# Patient Record
Sex: Female | Born: 1950 | Race: White | Hispanic: No | Marital: Single | Smoking: Former smoker
Health system: Southern US, Community
[De-identification: ages and names within clinical notes are randomized; demographics above are authoritative.]

## PROBLEM LIST (undated history)

## (undated) DIAGNOSIS — R569 Unspecified convulsions: Secondary | ICD-10-CM

## (undated) DIAGNOSIS — K219 Gastro-esophageal reflux disease without esophagitis: Secondary | ICD-10-CM

## (undated) DIAGNOSIS — M549 Dorsalgia, unspecified: Secondary | ICD-10-CM

## (undated) DIAGNOSIS — J449 Chronic obstructive pulmonary disease, unspecified: Secondary | ICD-10-CM

## (undated) DIAGNOSIS — I2699 Other pulmonary embolism without acute cor pulmonale: Secondary | ICD-10-CM

## (undated) HISTORY — DX: Dorsalgia, unspecified: M54.9

## (undated) HISTORY — PX: TUBAL LIGATION: SHX77

## (undated) HISTORY — DX: Gastro-esophageal reflux disease without esophagitis: K21.9

## (undated) HISTORY — PX: TONSILLECTOMY: SUR1361

---

## 2000-11-18 ENCOUNTER — Other Ambulatory Visit (HOSPITAL_COMMUNITY): Payer: Self-pay | Admitting: Neurology

## 2003-04-25 ENCOUNTER — Emergency Department (HOSPITAL_COMMUNITY): Payer: Self-pay

## 2012-09-17 ENCOUNTER — Other Ambulatory Visit (INDEPENDENT_AMBULATORY_CARE_PROVIDER_SITE_OTHER): Payer: Self-pay | Admitting: Physician Assistant

## 2016-12-11 NOTE — Care Management Notes (Signed)
MARS INTAKE    Referring Provider and Contact Number:  Dr. Nadene Rubins  Transfer Source and Contact Number:   [phone]  Transfer Emergent:  No    Date Admitted:  12/11/16 @ 12:20 pm  Diagnosis:  Altered Mental Status  PMH:      Dialysis:   no    Cancer:  no     Isolation:  no     Is patient established with family practice/attending?    Reason for transfer:  Needs higher level of care    Patient story/clinical presentation:  Presented to [facility] ER with c/o confusion.  Patient does not remember anything since Friday, December 07, 2016 although she went to class reunion and family reunion on Friday & Saturday.  Was found lying in bed confused by neighbor on _0 %   O2:     RA       Per MD labs WNL unless noted below.    Labs:  WBC (3.6-11) 11.8   HGB (13.1-17.3)    Hct (39.8-50.2) 50.6   PLT (140-400)    Na (135-145)    K+ (3.5-5.1)    CL (96-111)    CO2 (23-35)    BUN (8-26)    Cr (0.62-1.27)    Glucose (60-105) 132   Ca (8.5-10.4)    Mag (1.6-2.5)    Phos (2.4-4.7)    BNP (<100)    D-Dimer (<233)    AST (8-41)    ALT (<55)    Alk Phos (<150)    Amylase (25-125)    Lipase (10-80)    T. Bili (0.3-1.3)    PT (8.7-13.2)    PTT (25.1-36.5)    INR (0.8-1.2)     Trip-1 (<0.03) 0.34   CK    CPK    CK-MB    ABG ph    ABG PCO2    ABG PO2    ABG HCO3    Base def/exc    LP Glucose    LP Neutrophil    LP Protein    LP WBC    LP Other  Radiology (please place images on image grid):  Images requested  EKG:  IV Access:  PIV  Medications, IVF, Drips:  None    Oxygen/Bipap/Vent settings:    TV:        Peep:        FiO2:        Rate:            Pt class and accommodation: FLOOR  Service and accepting MD: MHS7/DR. [facility]    *Send Text Page to accepting service MD. *

## 2016-12-12 ENCOUNTER — Encounter (HOSPITAL_COMMUNITY): Payer: Self-pay | Admitting: Student in an Organized Health Care Education/Training Program

## 2016-12-12 ENCOUNTER — Ambulatory Visit (HOSPITAL_BASED_OUTPATIENT_CLINIC_OR_DEPARTMENT_OTHER): Payer: Medicare Other

## 2016-12-12 ENCOUNTER — Observation Stay (HOSPITAL_BASED_OUTPATIENT_CLINIC_OR_DEPARTMENT_OTHER): Payer: Medicare Other | Admitting: Internal Medicine

## 2016-12-12 ENCOUNTER — Observation Stay
Admission: AD | Admit: 2016-12-12 | Discharge: 2016-12-13 | Disposition: A | Discharge status: Home / Self Care | Payer: Medicare Other | Source: Other Acute Inpatient Hospital | Attending: Internal Medicine | Admitting: Internal Medicine

## 2016-12-12 DIAGNOSIS — M545 Low back pain: Secondary | ICD-10-CM

## 2016-12-12 DIAGNOSIS — R4182 Altered mental status, unspecified: Secondary | ICD-10-CM

## 2016-12-12 DIAGNOSIS — T405X5A Adverse effect of cocaine, initial encounter: Secondary | ICD-10-CM

## 2016-12-12 DIAGNOSIS — R001 Bradycardia, unspecified: Secondary | ICD-10-CM

## 2016-12-12 DIAGNOSIS — J849 Interstitial pulmonary disease, unspecified: Secondary | ICD-10-CM

## 2016-12-12 DIAGNOSIS — T404X5A Adverse effect of other synthetic narcotics, initial encounter: Secondary | ICD-10-CM | POA: Insufficient documentation

## 2016-12-12 DIAGNOSIS — G9340 Encephalopathy, unspecified: Secondary | ICD-10-CM | POA: Diagnosis not present

## 2016-12-12 DIAGNOSIS — G92 Toxic encephalopathy: Secondary | ICD-10-CM

## 2016-12-12 DIAGNOSIS — F17210 Nicotine dependence, cigarettes, uncomplicated: Secondary | ICD-10-CM | POA: Insufficient documentation

## 2016-12-12 DIAGNOSIS — F19929 Other psychoactive substance use, unspecified with intoxication, unspecified: Secondary | ICD-10-CM

## 2016-12-12 DIAGNOSIS — J45909 Unspecified asthma, uncomplicated: Secondary | ICD-10-CM | POA: Insufficient documentation

## 2016-12-12 LAB — URINALYSIS, MICROSCOPIC
RBCS: 0 /HPF (ref <6.0)
WBCS: 0 /HPF (ref <11.0)

## 2016-12-12 LAB — TROPONIN-I
TROPONIN I: 160 ng/L — ABNORMAL HIGH (ref 0–30)
TROPONIN I: 120 ng/L — ABNORMAL HIGH (ref 0–30)
TROPONIN I: 99 ng/L — ABNORMAL HIGH (ref 0–30)
TROPONIN I: 128 ng/L — ABNORMAL HIGH (ref 0–30)

## 2016-12-12 LAB — ECG 12-LEAD
Atrial Rate: 52 {beats}/min
Calculated P Axis: 59 degrees
Calculated R Axis: 53 degrees
Calculated T Axis: 132 degrees
PR Interval: 158 ms
QRS Duration: 72 ms
QT Interval: 466 ms
QTC Calculation: 433 ms
Ventricular rate: 52 {beats}/min

## 2016-12-12 LAB — CBC WITH DIFF
BASOPHIL #: 0.06 x10ˆ3/uL (ref 0.00–0.20)
BASOPHIL %: 1 %
EOSINOPHIL #: 0.09 x10ˆ3/uL (ref 0.00–0.50)
EOSINOPHIL %: 1 %
HCT: 47 % — ABNORMAL HIGH (ref 33.5–45.2)
HGB: 16.1 g/dL — ABNORMAL HIGH (ref 11.2–15.2)
LYMPHOCYTE #: 1.61 x10ˆ3/uL (ref 1.00–4.80)
LYMPHOCYTE %: 19 %
MCH: 31.7 pg (ref 27.4–33.0)
MCHC: 34.3 g/dL (ref 32.5–35.8)
MCV: 92.5 fL (ref 78.0–100.0)
MONOCYTE #: 0.61 x10ˆ3/uL (ref 0.30–1.00)
MONOCYTE %: 7 %
MPV: 8.2 fL (ref 7.5–11.5)
NEUTROPHIL #: 6.15 x10ˆ3/uL (ref 1.50–7.70)
NEUTROPHIL %: 72 %
PLATELETS: 191 x10ˆ3/uL (ref 140–450)
RBC: 5.08 x10ˆ6/uL — ABNORMAL HIGH (ref 3.63–4.92)
RDW: 13 % (ref 12.0–15.0)
WBC: 8.5 10*3/uL (ref 3.5–11.0)
WBC: 8.5 x10?3/uL (ref 3.5–11.0)

## 2016-12-12 LAB — BASIC METABOLIC PANEL
ANION GAP: 8 mmol/L (ref 4–13)
BUN/CREA RATIO: 30 — ABNORMAL HIGH (ref 6–22)
BUN: 21 mg/dL (ref 8–25)
CALCIUM: 9.1 mg/dL (ref 8.5–10.2)
CHLORIDE: 108 mmol/L (ref 96–111)
CO2 TOTAL: 25 mmol/L (ref 22–32)
CREATININE: 0.7 mg/dL (ref 0.49–1.10)
ESTIMATED GFR: >59 mL/min/1.73mˆ2 (ref >59)
GLUCOSE: 103 mg/dL (ref 65–139)
POTASSIUM: 3.8 mmol/L (ref 3.5–5.1)
SODIUM: 141 mmol/L (ref 136–145)

## 2016-12-12 LAB — DRUG SCREEN, HIGH OPIATE CUTOFF, NO CONFIRMATION, URINE
AMPHETAMINES URINE: NEGATIVE
BARBITURATES URINE: NEGATIVE
BENZODIAZEPINES URINE: NEGATIVE
BUPRENORPHINE URINE: POSITIVE — AB
CANNABINOIDS URINE: NEGATIVE
COCAINE METABOLITES URINE: POSITIVE — AB
CREATININE RANDOM URINE: 156 mg/dL
ECSTASY/MDMA URINE: NEGATIVE
METHADONE URINE: NEGATIVE
OPIATES URINE (HIGH CUTOFF): NEGATIVE
OXYCODONE URINE: NEGATIVE

## 2016-12-12 LAB — ETHANOL, SERUM: ETHANOL: NOT DETECTED

## 2016-12-12 LAB — URINALYSIS, MACROSCOPIC
BILIRUBIN: NEGATIVE mg/dL
COLOR: NORMAL
GLUCOSE: NEGATIVE mg/dL
KETONES: NEGATIVE mg/dL
LEUKOCYTES: NEGATIVE WBCs/uL
NITRITE: NEGATIVE
PH: 6 (ref 5.0–8.0)
PROTEIN: NEGATIVE mg/dL
SPECIFIC GRAVITY: 1.025 (ref 1.005–1.030)
UROBILINOGEN: 4 mg/dL — AB

## 2016-12-12 LAB — ETHANOL, SERUM/PLASMA: ETHANOL: <10 mg/dL (ref <10)

## 2016-12-12 LAB — PHOSPHORUS: PHOSPHORUS: 3.9 mg/dL (ref 2.3–4.0)

## 2016-12-12 LAB — MAGNESIUM: MAGNESIUM: 2.3 mg/dL (ref 1.6–2.5)

## 2016-12-12 LAB — THYROID STIMULATING HORMONE WITH FREE T4 REFLEX: TSH: 1.114 u[IU]/mL (ref 0.350–5.000)

## 2016-12-12 MED ORDER — SODIUM CHLORIDE 0.9 % (FLUSH) INJECTION SYRINGE
2.0000 mL | INJECTION | INTRAMUSCULAR | Status: DC | PRN
Start: 2016-12-12 — End: 2016-12-13

## 2016-12-12 MED ORDER — ENOXAPARIN 40 MG/0.4 ML SUBCUTANEOUS SYRINGE
40.0000 mg | INJECTION | Freq: Every day | SUBCUTANEOUS | Status: DC
Start: 2016-12-12 — End: 2016-12-13
  Administered 2016-12-12 – 2016-12-13 (×2): 40 mg via SUBCUTANEOUS
  Filled 2016-12-12 (×2): qty 0.4

## 2016-12-12 MED ORDER — SODIUM CHLORIDE 0.9 % (FLUSH) INJECTION SYRINGE
2.0000 mL | INJECTION | Freq: Three times a day (TID) | INTRAMUSCULAR | Status: DC
Start: 2016-12-12 — End: 2016-12-13
  Administered 2016-12-12 – 2016-12-13 (×4): 2 mL

## 2016-12-12 MED ADMIN — sodium chloride 0.9 % (flush) injection syringe: @ 14:00:00

## 2016-12-12 NOTE — H&P (Signed)
[facility]  General Medicine  Admission H&P    Date of Service:  12/12/2016  Judy, Ball, 66 y.o. female  Date of Admission:  12/12/2016  Date of Birth:  Sep 20, 1950  PCP: No Pcp    LAY CAREGIVER   Appointed Lay Caregiver?: I Decline     Information Obtained from: patient and daughter  Chief Complaint:  Acute encephalopathy    HPI: Judy Ball is a 66 y.o., White female who presents with a four day history of altered mental status and impaired memory. The patient last recollects events from 8/3, and since then has no memory of any events. Per patient's family, she was seen at a family reunion on Saturday, and was seen telling relatives that she was to attend a class reunion. The patient was afterwards then seen on Monday by a nephew who lives next door and went to check in on the patient, noticing that she seemed to be confused, unable to get out of bed, and complaining of a sore back. The nephew contacted the patient's daughter, who arrived on Tuesday and took the patient to the [facility] ER. Initial workup there was positive for a UDS with cocaine, and CT head was negative for an acute process. On examination at the ER, tongue maceration was noted, as well as bruising over the elbows and tenderness over the coccyx. She was then transferred to [facility] for further workup for her acute encephalopathy.    She denies any knowledge of LOC, falls, seizures, head trauma, dizziness, bowel or bladder complaints, fever, cough, sore throat, vision changes, chest pain, or focal neurological deficits. She lives alone at home, and has no difficulty managing her DALYs    Her past medical history is unremarkable.    PAST MEDICAL:    No past medical history on file.  Past Medical History was reviewed and is negative   Past Surgical History was reviewed and is negative         Medications Prior to Admission     None        No Known Allergies    Family History  No significant family history    Social History  Social History      Social History   . Marital status: Divorced     Spouse name: N/A   . Number of children: N/A   . Years of education: N/A     Occupational History   . Not on file.     Social History Main Topics   . Smoking status: Current Every Day Smoker     Packs/day: 1.00     Years: 40.00   . Smokeless tobacco: Not on file   . Alcohol use Not on file   . Drug use: Not on file   . Sexual activity: Not on file     Other Topics Concern   . Not on file     Social History Narrative   . No narrative on file     Review of Systems:  General: Negative for fevers, chills or weight loss   Eyes:  Negative for change in vision   HENT: Negative for hearing loss, nasal congestion, epistaxis, sore mouth,  sore throat   Cardiovascular: Negative for chest pain, palpitations, lower extremity edema   Respiratory: Negative for cough, dyspnea, wheezing   GI: Negative for nausea, vomiting, abdominal pain, diarrhea, constipation, melena,  hematochezia, dysphagia, odynophagia   GU: Negative for hematuria, dysuria   Musculoskeletal: Negative for myalgias, arthralgias  Skin: Negative for rashes, lesions   Neurological: Negative for lightheadedness, headaches, dizziness, paresthesias, weakness   Hematological: Negative for easy bruising or bleeding   Psych:  Negative for depression, anxiety       Examination:  Temperature: 36.6 C (97.9 F) Heart Rate: 59 BP (Non-Invasive): 133/72   Respiratory Rate: 18 SpO2-1: 95 % Pain Score (Numeric, Faces): 0     General: In no acute distress, well developed, well nourished  HEENT: Mucous membranes moist  Eyes: Conjunctiva non-icteric, no conjunctival pallor  Neck: Soft, supple  CV: Regular rate and rhythm, with no murmurs  Pulm: Clear to auscultation bilaterally, with no wheezing  Abd: Soft, nontender, nondistended, with normal bowel sounds  Extrem: No cyanosis or edema  Skin: Warm and Dry  Neuro: AAO x 3, with no focal deficits   - Strength 5/5 BUE and BLE   - Sensation intact to light touch and symmetric throughout     Labs:    Lab Results Today:  No results found for any visits on 12/12/16 (from the past 24 hour(s)).    DNR Status:  Full Code    Assessment/Plan:   Active Hospital Problems    Diagnosis   . Primary Problem: Acute encephalopathy   . Substance intoxication (CMS HCC)     Acute encephalopathy  - Positive UDS for cocaine raises the possibility of substance intoxication as the reason for her episode of memory loss and encephalopathy  - Due to oral exam findings concerning for injury to the tongue, there is an underlying possibility of seizure as the antecedent cause. EEG ordered to rule out recent seizure  - ECG, troponins sent due to concern for possible arrythmia/cardiogenic syncope  - BMP, TSH sent for a possible metabolic cause for her acute encephalopathy  - UA, CBC, to rule out infectious cause of her symptoms  - Repeating UDS, serum ethanol     DVT/PE Prophylaxis: Enoxaparin    Armaghan-e-Rehman Mansoor  PGY-1 Internal Medicine  [phone]      I saw and examined the patient.  I reviewed the resident's note.  I agree with the findings and plan of care as documented in the resident's note.  Any exceptions/additions are edited/noted.    Lisbeth PlyMatthew Ewen Varnell, MD

## 2016-12-12 NOTE — Progress Notes (Signed)
INTERNAL MEDICINE  Progress Note     Name: Judy Ball,Judy Ball, 66 y.o. female Date of Service: 12/12/2016   Date of Birth: 08/19/1950 Date of Admission: 12/12/2016   PCP:No Pcp LOS: 1   BJY:[id number]   Attending: Lisbeth PlyBrunner, Cinque Begley, MD  Chief Complaint: had no chief complaint listed for this encounter.   Code Status: Full Code Admitted for: Acute encephalopathy     SUBJECTIVE:    Yesterday Ms. Judy Ball . Overnight she presented with altered mental status and recent memory loss for last 4-5 days. She is alert/oriented/ awake x 3. She answers questions appropriately. C/o pain at lower back over coccyx. She denies chest pain, headache, abdominal pain, dysuria, frequency, altered bowel movement. No significant acute event overnight after her admission.      OBJECTIVE:    EXAMINATION:  Temperature: 36.8 C (98.2 F) BP (Non-Invasive): 107/73 Heart Rate: 66   Respiratory Rate: 18 SpO2-1: 93 % Weight: 57.3 kg (126 lb 5.2 oz)          Intake/Output Summary (Last 24 hours) at 12/12/16 1601  Last data filed at 12/12/16 1532   Gross per 24 hour   Intake              660 ml   Output              100 ml   Net              560 ml     Last Bowel Movement: 12/09/16     General: Well-nourished, well-developed, in NAD.  HEENT: NC, EOMI, Oropharynx clear, mucous membranes moist, Trachea midline. Macerated toungue + on right side.  Cardiovascular: RRR, no murmurs, rubs, gallops.  Respiratory: CTABL, no wheezing or murmur.  Abdominal: Bowel sounds normal; abdomen soft, non-tender to palpation  Extremities/Skin: No edema. Warm and dry. no cyanosis. Bruises  noted on bilateral elbow.  Neurological: Awake, A&O x 3.  no focal deficits. Tenderness on coccyx.                             strength 5/5 BUE and BLE                             intact sensation throughout.      LABS:    CBC Differential   Recent Labs      12/12/16   0529   WBC  8.5   HGB  16.1*   HCT  47.0*   PLTCNT  191    Recent Labs      12/12/16   0529   PMNS  72   LYMPHOCYTES  19      MONOCYTES  7   EOSINOPHIL  1   BASOPHILS  1   0.06   PMNABS  6.15   LYMPHSABS  1.61   MONOSABS  0.61   EOSABS  0.09      BMP LFTs   Recent Labs      12/12/16   0529   SODIUM  141   POTASSIUM  3.8   CHLORIDE  108   CO2  25   BUN  21   CREATININE  0.70   GLUCOSENF  103   ANIONGAP  8   BUNCRRATIO  30*   GFR  >59   CALCIUM  9.1   MAGNESIUM  2.3   PHOSPHORUS  3.9    Recent Labs  12/12/16   1440   UROBILINOGEN  4.0  *      CoAgs Blood Gas:   No results found for this encounter No results found for this encounter    Cardiac Markers Lipid Panel   Recent Labs      12/12/16   0704  12/12/16   1058  12/12/16   1457   TROPONINI  160*  120*  99*    No results found for this encounter     IMAGING STUDIES:    Results for orders placed or performed during the hospital encounter of 12/12/16 (from the past 24 hour(s))   XR AP MOBILE CHEST     Status: None    Narrative    Judy Ball  Female, 66 years old.    XR AP MOBILE CHEST performed on 12/12/2016 7:41 AM.    REASON FOR EXAM:  altered mental status, possible suspicion for infectious  etiology    TECHNIQUE: 1 views/1 images submitted for interpretation.    COMPARISON: None    FINDINGS  :    LUNGS:  The left lung is clear. There is mild interstitial prominence in  the right lung, which may be acute or chronic.    HEART AND VASCULATURE:  Normal.    PLEURA:  No pleural effusion.    SUPPORT DEVICES:  None.      Impression    1. Mild interstitial pattern in the right lung, acute or chronic. In the  appropriate clinical setting, a viral pneumonia could present a similar  appearance.  2. Otherwise negative mobile chest examination.         PATHOLOGY & MICROBIOLOGY:    No results found for any visits on 12/12/16 (from the past 24 hour(s)).    INPATIENT MEDICATIONS:    Current Facility-Administered Medications:  enoxaparin PF (LOVENOX) 40 mg/0.4 mL SubQ injection 40 mg Subcutaneous Daily   NS flush syringe 2 mL Intracatheter Q8HRS   And      NS flush syringe 2-6 mL Intracatheter Q1  MIN PRN        ASSESSMENT:   Judy Ball is a 66 y.o. female with no significant PMH of altered mental status and impaired memory since last 5 days. Pt was found to be confused lying in bed with sore back by nephew who is her neighbor and then he contacted pt's daughter who took her to [facility] ER where UDS +Ve for cocaine and CT head was negative for acute process. Tranasferred here for further work up. Current assessment for causes of acute enchelopathy is ongoing, appears 2/2 substance abuse.    PLAN:       Acute encephalopathy 2/2 substance abuse  - UDS +ve for cocaine and buprenorphine.  - Macerated tongue on right side on examination , possibly seizure as antecedent cause, awaiting EEG.  - Negative EKG (sinus bradycardia) and trending troponin 160-->120 argue against cardiac arrythmia/syncope.   - Metabolic causes for acute encephopathy ruled out - Negative BMP and Normal TSH.  - UA unremarkable.  - normal serum ethanol.      DVT/PE Prophylaxis - Enoxaparin 40 mg subcutaneous q24hr  Consults - none  Hardware (Lines, Drains, Foley, Tubes) - PIV  Diet - Regular  Activity - Up ad Lib  Therapy - None  Disposition Planning - Home    Rachel Bo, MD - 12/12/2016   PGY-1, Transitional year.  [facility]        I saw and examined the  patient.  I reviewed the resident's note.  I agree with the findings and plan of care as documented in the resident's note.  Any exceptions/additions are edited/noted.    Phineas Douglas, MD

## 2016-12-12 NOTE — Care Management Notes (Signed)
12/12/16 1553   Referral Information   Admission Type observation   Observation Form   Observation Form Given MOON form given   MOON form given to Patient   MOON form date of delivery 12/12/16   MOON form time of delivery 1507     Received call from UR that pt was being changed to OBS status. MSW discussed OBS status with pt at bedside, provided pt with copy of OBS letter, and had pt sign a copy of OBS letter to be placed in CM dept for scanning.

## 2016-12-12 NOTE — Ancillary Notes (Signed)
[facility]  Neurolab Tech Note    Francesca JewettDebra L Fern  DATE OF SERVICE:  12/12/2016          EEG has been completed.      Dennie MaizesMarisa Renate Danh, [facility]

## 2016-12-12 NOTE — Care Management Notes (Signed)
Utilization Review Determination    SECTION I  Reason for Physician Advisor Referral: Does not meet screening criteria for admission. 12/12/2016    Current MERLIN MD Order: inpt    Utilization Review Findings: Patient meets observation status.  Utilization Review MD: Dr. Seward MethShanti Manivannan    Based on clinical information available to me as per above and in chart, the patient's status should be: Observation    Judy DownsSherry Beitzel 12/12/2016, 15:00  -------------------------------------------------------------------------------------------------------------------  1.  I have reviewed this case with the patient's treating physician Dr. Mila MerryS. Hanlon, and the MD agrees with this change in status.  They are aware of the referral to the Utilization Review Committee.    2.  The patient will be notified by Opal SidlesAngela Conner MSW on 12/12/2016 of changes in level of care.  Please refer to the education documentation and/or [employer] for specific time of delivery.    Judy DownsSherry Beitzel 12/12/2016, 15:00  (Patient notification is required only if the status is changed while an Inpatient to Observation Status)  -------------------------------------------------------------------------------------------------------------------

## 2016-12-12 NOTE — Procedures (Signed)
[name]:  Ball, Judy Ball  HOSPITAL NUMBER:  [id number]  DATE OF SERVICE:  12/12/2016  DOB:  December 01, 1950  SEX:  F      Date of Study:  December 12, 2016.     EEG #:  [id number].  Status:  Inpatient.  Technician:  MW.    REQUESTING PHYSICIAN:  Lisbeth PlyMatthew Brunner, MD.    START TIME:  December 12, 2016, at 8:44:08 a.m.    END TIME:  December 12, 2016, at 9:27:23 a.m.    HISTORY:  This is a 66 year old female with altered mental status and possible history of seizure.    DESCRIPTION:  This is a digitally acquired EEG completed using the standard 10-20 electrode system over a period of the 43 minutes.  The background of the EEG shows a symmetric posterior dominant rhythm of 9.5 Hz.  There is photic driving present.  There are awake and asleep states noted.  There are no epileptiform discharges, electrographic seizures, or clinical spells.    INTERPRETATION:  This is a normal awake and asleep routine EEG.  A normal EEG does not rule out epilepsy ; however, there was no evidence of an underlying seizure tendency on this recording. Clinical correlation required.         Andreas OhmLaura Danielson, MD    Judy FeyJohn Laekyn Rayos, MD  Assistant Professor   [facility] Department of Pediatrics and Neurology        CC:   Lisbeth PlyMatthew Brunner, MD       DD:  12/12/2016 18:08:49  DT:  12/12/2016 18:16:23 KF  D#:  [id number]    I read this EEG and agree with the resident's report. Any exceptions have been edited by me.  Judy FeyJohn Demarkis Gheen, MD

## 2016-12-12 NOTE — Nurses Notes (Signed)
Patient arrived to 6E bed 663.

## 2016-12-12 NOTE — Discharge Summary (Signed)
[facility]  DISCHARGE SUMMARY      PATIENT NAME:  Judy Ball,Judy Ball  MRN:  [id number]  DOB:  06/25/1950    ENCOUNTER DATE:  12/12/2016  INPATIENT ADMISSION DATE: 12/12/2016  DISCHARGE DATE: 12/13/16    ATTENDING PHYSICIAN: Lisbeth PlyBrunner Bach Rocchi, MD  SERVICE: MEDICINE 1  PRIMARY CARE PHYSICIAN: No Pcp     Reason for Admission     Diagnosis    Altered mental status [118920]    Altered mental status [118920]          DISCHARGE DIAGNOSIS:     Principal Problem:  Acute encephalopathy    Active Hospital Problems    Diagnosis Date Noted    Principle Problem: Acute encephalopathy 12/12/2016    Substance intoxication (CMS HCC) 12/12/2016    Altered mental status 12/12/2016      Resolved Hospital Problems    Diagnosis    No resolved problems to display.     Active Non-Hospital Problems    Diagnosis Date Noted    Asthma 04/25/2003      No Known Allergies         DISCHARGE MEDICATIONS:     Current Discharge Medication List      Notice     You have not been prescribed any medications.        Discharge med list refreshed?  YES    During this hospitalization did the patient have an AMI, PCI/PCTA, STENT or Isolated CABG?  No                DISCHARGE INSTRUCTIONS:    No discharge procedures on file.       REASON FOR HOSPITALIZATION AND HOSPITAL COURSE:  This is a 66 y.o., female without any  past medical history presented as direct admission with confusion, altered mental statuts and recent memory loss. Pt last recollects memory of 8/4 and 8/5 and since then she has no memory of events till admission date of 8/8. As per family, she attended family reunion on 8/4 and admitted attending her class reunion on weekend. on 8/6, nephew who livers next door, went to check in on the patient and found her to be confused lying in bed with complain of sore back. Nephew contacted Pt's daughter and she [facility], where initial work up was positive for UDS coccain and negative brain CT for any acute process. On examination, tongue  maceration, bruising over bilateral elbows and tenderness over coccyx noted.  Positive UDS for cocaine and buprenorphine. After admission CBC, BMP, TSH, UA, EEG and EKG were unremarkable for causes of acute encephalopathy. Trending troponins were negative for cardiac ischemia. CXR reveled mild interstitial pattern in right lung, acute or chronic -is non significant in current clinical setting. Pt is asymptotic, no confusion and AAA x 3 on discharge.       CONDITION ON DISCHARGE:  A. Ambulation: Full ambulation  B. Self-care Ability: Complete  C. Cognitive Status Oriented x 3  D. DNR status at discharge: Full Code    Advance Directive Information       Most Recent Value    Does the Patient have an Advance Directive? No, Information Offered and Refused          DISCHARGE DISPOSITION:  Home discharge      Pending/Ordered Tests/Procedures:  No    Pending/Ordered Referrals/Follow-up:  No    Rachel BoZalak Patel, MD  PGY- 1, Transitional year.      Copies sent to Care Team  Relationship Specialty Notifications Start End    Pcp, No PCP - General   12/11/16           Referring providers can utilize [email or web address] to access their referred [facility] patient's information.

## 2016-12-12 NOTE — Care Plan (Signed)
Problem: Patient Care Overview (Adult,OB)  Goal: Plan of Care Review(Adult,OB)  The patient and/or their representative will communicate an understanding of their plan of care   Outcome: Ongoing (see interventions/notes)  Pt admitted for AMS. Pt has (+) UDS, EEG ordered, labs and cultures pending. Pt likely to discharge him when medically cleared. The patient will continue to be evaluated for developing discharge needs.

## 2016-12-12 NOTE — Care Plan (Signed)
Problem: Patient Care Overview (Adult,OB)  Goal: Plan of Care Review(Adult,OB)  The patient and/or their representative will communicate an understanding of their plan of care   Outcome: Ongoing (see interventions/notes)  Admitted 12/12/2016 for encehalopathy.  Per report pt with periods of confusion none noticed so far this shift.  Neuro checks every 4 hours ordered.  No safety issues so far this shift, call bell in reach hourly safety checks completed per staff.  Family at bedside.  Will continue to monitor and provide support as needed.

## 2016-12-12 NOTE — Care Management Notes (Signed)
Judy Ball    Patient Name: Judy Ball  Date of Birth: 12-30-1950  Sex: female  Date/Time of Admission: 12/12/2016  3:20 AM  Room/Bed: 663/A  Payor: MEDICARE / Plan: MEDICARE PART A AND B / Product Type: Medicare /   PCP: No Pcp    Pharmacy Info:   Preferred Pharmacy     None        Emergency Contact Info:   Extended Emergency Contact Information  Primary Emergency Contact: Judy Ball [place] Phone: [phone]  Work Phone: [phone]  Mobile Phone: [phone]  Relation: Mother    History:   Judy Ball is a 66 y.o., female, admitted for AMS.    Height/Weight: 162.6 cm ('5\' 4"'$ ) / 57.3 kg (126 lb 5.2 oz)     LOS: 0 days   Admitting Diagnosis: Altered mental status [R41.82]    Assessment:      12/12/16 1351   Assessment Details   Assessment Type Admission   Date of Care Management Update 12/12/16   Date of Next DCP Update 12/14/16   Readmission   Is this a readmission? No   Care Management Plan   Discharge Planning Status initial meeting   Projected Discharge Date 12/14/16   CM will evaluate for rehabilitation potential yes   Patient choice offered to patient/family no   Form for patient choice reviewed/signed and on chart no   Patient aware of possible cost for ambulance transport?  No   Discharge Needs Assessment   Equipment Currently Used at Home none   Discharge Facility/Level Of Care Needs Home (Patient/Family Member/other)(code 1)   Transportation Available car;family or friend will provide   Referral Information   Admission Type inpatient   Address Verified verified-no changes   Arrived From acute hospital, other   [place] Other - See Comments  Judy Ball)   Insurance Verified verified-no change   ADVANCE DIRECTIVES   Does the Patient have an Advance Directive? No, Information Offered and Refused   LAY CAREGIVER   Appointed Lay Caregiver? I Decline   Employment/Financial   Patient has Prescription Coverage?  Yes        Name of Insurance Coverage for Medications Part D   Financial Concerns none   Living Environment   Select an age group to open "lives with" row.  Adult   Lives With alone   Living Arrangements house   Able to Return to Prior Arrangements yes   Home Safety   Home Assessment: No Problems Identified   Home Accessibility stairs to enter home;stairs within home   Legal Issues   Do you have a court appointed guardian/conservator? No     MSW met with pt and her daughter Judy Ball at bedside in Rm 663, introduced self and explained CM role to pt, and completed initial assessment. MSW placed contact information on pt's whiteboard. Pt resides alone in a house. Pt has transportation home. She will be staying with her daughter Judy Ball in [place] for a little while. Pt has no PCP. Preferred pharmacy is [facility]. Pt does not use DME and is not active with HH. Pt reports being fully independent. Pt declined MPOA and lay caregiver.     Discharge Plan:  SNF Placement (Medicare certified) (code 3)    Pt admitted for AMS. Pt has (+) UDS, EEG ordered, labs and cultures pending. Pt likely to discharge him when medically cleared. The patient will  continue to be evaluated for developing discharge needs.     Case Manager: Judy Ball  Phone: [phone]

## 2016-12-13 DIAGNOSIS — R55 Syncope and collapse: Secondary | ICD-10-CM

## 2016-12-13 LAB — BASIC METABOLIC PANEL
ANION GAP: 8 mmol/L (ref 4–13)
BUN/CREA RATIO: 24 — ABNORMAL HIGH (ref 6–22)
BUN: 16 mg/dL (ref 8–25)
CALCIUM: 9.1 mg/dL (ref 8.5–10.2)
CHLORIDE: 107 mmol/L (ref 96–111)
CO2 TOTAL: 25 mmol/L (ref 22–32)
CREATININE: 0.67 mg/dL (ref 0.49–1.10)
ESTIMATED GFR: >59 mL/min/1.73mˆ2 (ref >59)
GLUCOSE: 95 mg/dL (ref 65–139)
POTASSIUM: 4.1 mmol/L (ref 3.5–5.1)
SODIUM: 140 mmol/L (ref 136–145)

## 2016-12-13 LAB — MAGNESIUM: MAGNESIUM: 2.1 mg/dL (ref 1.6–2.5)

## 2016-12-13 LAB — TROPONIN-I: TROPONIN I: 71 ng/L — ABNORMAL HIGH (ref 0–30)

## 2016-12-13 LAB — CBC
HCT: 47.4 % — ABNORMAL HIGH (ref 33.5–45.2)
HGB: 16.2 g/dL — ABNORMAL HIGH (ref 11.2–15.2)
MCH: 31.7 pg (ref 27.4–33.0)
MCHC: 34.2 g/dL (ref 32.5–35.8)
MCV: 92.7 fL (ref 78.0–100.0)
MPV: 8.2 fL (ref 7.5–11.5)
PLATELETS: 195 x10ˆ3/uL (ref 140–450)
RBC: 5.11 x10ˆ6/uL — ABNORMAL HIGH (ref 3.63–4.92)
RDW: 12.8 % (ref 12.0–15.0)
WBC: 6.4 x10ˆ3/uL (ref 3.5–11.0)

## 2016-12-13 LAB — PHOSPHORUS: PHOSPHORUS: 4.3 mg/dL — ABNORMAL HIGH (ref 2.3–4.0)

## 2016-12-13 NOTE — Care Management Notes (Signed)
[facility] Medicine   Transitional Care Coordination   Initial Visit       Name: [name]   Age: 66 y.o.  Date of Birth: February 16, 1951   Date of service: 12/13/2016  Date of Admission:  12/12/2016  Hospital Day:  LOS: 0 days      Patient has no day/time preferences for follow up appointments.    Transition Team contact information unable to be added to AVS as patient has been discharged. Discussed with patient role of transitional care coordination team and informed patient of contact within 2 business days of discharge to assess follow up plan.    Transportation to healthcare appointments:Family transports    Met with Patient to discuss provider of primary care services.   PCP listed as No Pcp . This is correct per patient. Patient is unsure if she will be going home or staying with her daughter, [name], in [place]. Discussed TCC if patient will be staying here. Provided my name and contact information, informed patient and daughter they could call me if patient would like TCC appointment. Patient states if she returns home she would prefer to establish herself with a PCP.    Preferred method of contact: Daughter, [name] [phone]  Confirmed patient contact information:   Home Phone [phone]   Work Phone [phone]   Mobile [phone]   .     [address] [place], 12/13/2016  Ext. [phone]

## 2016-12-13 NOTE — Nurses Notes (Signed)
Reviewed discharge instructions with patient including medications, follow up appointments and home care.  Provided opportunity for patient to ask questions.  All questions have been answered and patient verbalizes understanding.  Patient will be leaving hospital in care of family.  Offered wheelchair assistance for discharge.

## 2016-12-13 NOTE — Progress Notes (Signed)
INTERNAL MEDICINE  Progress Note     Name: Judy Ball, Judy Ball, 66 y.o. female Date of Service: 12/13/2016   Date of Birth: 1951/04/11 Date of Admission: 12/12/2016   PCP:No Pcp LOS: 1   ZOX:[id number]   Attending: Lisbeth Ply, MD  Chief Complaint: had no chief complaint listed for this encounter.   Code Status: Full Code Admitted for: Acute encephalopathy     SUBJECTIVE:     Judy Ball was sleeping in bed comfortably. She states her chest was pounding yesterday but much better now. Denies any chest pain, abdominal pain. C/o low back pain over coccyx. No fever, chills, nausea, vomiting. Answers all questions appropriately.    OBJECTIVE:    EXAMINATION:  Temperature: 36.4 C (97.5 F) BP (Non-Invasive): 136/79 Heart Rate: 54   Respiratory Rate: 18 SpO2-1: 97 % Weight: 57.3 kg (126 lb 5.2 oz)          Intake/Output Summary (Last 24 hours) at 12/13/16 1129  Last data filed at 12/13/16 0400   Gross per 24 hour   Intake             1140 ml   Output              100 ml   Net             1040 ml     Last Bowel Movement: 12/09/16     General: Well-nourished, well-developed, in NAD.  HEENT: NC, EOMI, Oropharynx clear, mucous membranes moist, Trachea midline. Macerated toungue + on right side.  Cardiovascular: RRR, no murmurs, rubs, gallops.  Respiratory: CTABL, no wheezing or murmur.  Abdominal: Bowel sounds normal; abdomen soft, non-tender to palpation  Extremities/Skin: No edema. Warm and dry. no cyanosis. Bruises  noted on bilateral elbow.  Neurological:  No confusion. Awake, A&O x 3.  no focal deficits. Tenderness on coccyx +.                              strength 5/5 BUE and BLE                             intact sensation throughout.      LABS:    CBC Differential   Recent Labs      12/12/16   0529  12/13/16   0530   WBC  8.5  6.4   HGB  16.1*  16.2*   HCT  47.0*  47.4*   PLTCNT  191  195    Recent Labs      12/12/16   0529   PMNS  72   LYMPHOCYTES  19   MONOCYTES  7   EOSINOPHIL  1   BASOPHILS  1  0.06   PMNABS  6.15    LYMPHSABS  1.61   MONOSABS  0.61   EOSABS  0.09      BMP LFTs   Recent Labs      12/13/16   0530   SODIUM  140   POTASSIUM  4.1   CHLORIDE  107   CO2  25   BUN  16   CREATININE  0.67   GLUCOSENF  95   ANIONGAP  8   BUNCRRATIO  24*   GFR  >59   CALCIUM  9.1   MAGNESIUM  2.1   PHOSPHORUS  4.3*    Recent Labs      12/12/16  1440   UROBILINOGEN  4.0  *      CoAgs Blood Gas:   No results found for this encounter No results found for this encounter    Cardiac Markers Lipid Panel   Recent Labs      12/12/16   1457  12/12/16   1820  12/13/16   0530   TROPONINI  99*  128*  71*    No results found for this encounter     IMAGING STUDIES:         PATHOLOGY & MICROBIOLOGY:    No results found for any visits on 12/12/16 (from the past 24 hour(s)).    INPATIENT MEDICATIONS:    Current Facility-Administered Medications:  enoxaparin PF (LOVENOX) 40 mg/0.4 mL SubQ injection 40 mg Subcutaneous Daily   NS flush syringe 2 mL Intracatheter Q8HRS   And      NS flush syringe 2-6 mL Intracatheter Q1 MIN PRN        ASSESSMENT:   Judy Ball is a 66 y.o. female with no significant PMH presented with confusion, altered mental status and impaired memory since last 5 days. UDS +ve for cocaine and buprenorphine. Pending EEG is negative. Rest of all investigation for acute encephalopathy is negative. Trending troponin is negative for cardiac ischemia.      PLAN:       Acute encephalopathy 2/2 substance abuse  - UDS +ve for cocaine and buprenorphine.  - Macerated tongue on right side on examination although seizure less likely,  EEG negative.   - Negative EKG (sinus bradycardia) and trending troponin 120-->90-->71. argue against cardiac arrythmia/syncope/ischemia.   - Metabolic causes for acute encephopathy ruled out - Negative BMP and Normal TSH.  - UA unremarkable.  - normal serum ethanol.  - will be discharged to home today.      DVT/PE Prophylaxis - Enoxaparin 40 mg subcutaneous q24hr  Consults - none  Hardware (Lines, Drains, Foley, Tubes)  - PIV  Diet - Regular  Activity - Up ad Lib  Therapy - None  Disposition Planning - Home/ self care with Daughter.      Rachel BoZalak Patel, MD - 12/13/2016   PGY-1, Transitional year.  [facility]        I saw and examined the patient.  I reviewed the resident's note.  I agree with the findings and plan of care as documented in the resident's note.  Any exceptions/additions are edited/noted.    Lisbeth PlyMatthew Jeanann Balinski, MD

## 2016-12-13 NOTE — Care Plan (Signed)
Problem: Fall Risk (Adult)  Goal: Absence of Falls  Patient will demonstrate the desired outcomes by discharge/transition of care.   Outcome: Ongoing (see interventions/notes)  Patient without any complaints of pain overnight.  Resting comfortably on safety checks.  Will continue to monitor.

## 2016-12-13 NOTE — Nurses Notes (Signed)
Pt received all scheduled medications during the shift. D/C instructions offered, given, and instructed with no questions asked by the pt or daughter. IV removed and replaced with sterile gauze. No evidence of inflammation or irritation at the site. Wheelchair for transport needed. Pt Ax0x4 and ambulating unassisted in the room.

## 2016-12-14 LAB — ECG 12-LEAD
Atrial Rate: 60 {beats}/min
Calculated P Axis: 75 degrees
Calculated R Axis: 73 degrees
Calculated T Axis: 86 degrees
PR Interval: 154 ms
QRS Duration: 72 ms
QT Interval: 430 ms
QTC Calculation: 430 ms
Ventricular rate: 60 {beats}/min

## 2019-03-19 ENCOUNTER — Encounter (HOSPITAL_COMMUNITY): Payer: Self-pay | Admitting: Emergency Medicine

## 2019-03-19 ENCOUNTER — Other Ambulatory Visit: Payer: Self-pay

## 2019-03-19 ENCOUNTER — Inpatient Hospital Stay (HOSPITAL_COMMUNITY)
Admission: EM | Admit: 2019-03-19 | Discharge: 2019-03-22 | DRG: 193 | Disposition: A | Discharge status: Home / Self Care | Payer: Medicare Other | Attending: Family Medicine | Admitting: Family Medicine

## 2019-03-19 ENCOUNTER — Emergency Department (HOSPITAL_COMMUNITY): Payer: Medicare Other

## 2019-03-19 DIAGNOSIS — Z20828 Contact with and (suspected) exposure to other viral communicable diseases: Secondary | ICD-10-CM | POA: Diagnosis present

## 2019-03-19 DIAGNOSIS — R0902 Hypoxemia: Secondary | ICD-10-CM

## 2019-03-19 DIAGNOSIS — F17200 Nicotine dependence, unspecified, uncomplicated: Secondary | ICD-10-CM | POA: Diagnosis present

## 2019-03-19 DIAGNOSIS — J9601 Acute respiratory failure with hypoxia: Secondary | ICD-10-CM | POA: Diagnosis present

## 2019-03-19 DIAGNOSIS — J189 Pneumonia, unspecified organism: Secondary | ICD-10-CM | POA: Diagnosis not present

## 2019-03-19 DIAGNOSIS — J441 Chronic obstructive pulmonary disease with (acute) exacerbation: Secondary | ICD-10-CM

## 2019-03-19 DIAGNOSIS — J440 Chronic obstructive pulmonary disease with acute lower respiratory infection: Secondary | ICD-10-CM | POA: Diagnosis present

## 2019-03-19 DIAGNOSIS — Z7901 Long term (current) use of anticoagulants: Secondary | ICD-10-CM

## 2019-03-19 DIAGNOSIS — Z880 Allergy status to penicillin: Secondary | ICD-10-CM

## 2019-03-19 DIAGNOSIS — J9611 Chronic respiratory failure with hypoxia: Secondary | ICD-10-CM | POA: Diagnosis present

## 2019-03-19 DIAGNOSIS — Z86711 Personal history of pulmonary embolism: Secondary | ICD-10-CM

## 2019-03-19 DIAGNOSIS — R569 Unspecified convulsions: Secondary | ICD-10-CM

## 2019-03-19 HISTORY — DX: Chronic obstructive pulmonary disease, unspecified: J44.9

## 2019-03-19 HISTORY — DX: Unspecified convulsions: R56.9

## 2019-03-19 HISTORY — DX: Other pulmonary embolism without acute cor pulmonale: I26.99

## 2019-03-19 LAB — BASIC METABOLIC PANEL
Anion gap: 12 (ref 5–15)
BUN: 12 mg/dL (ref 8–23)
CO2: 23 mmol/L (ref 22–32)
Calcium: 9 mg/dL (ref 8.9–10.3)
Chloride: 101 mmol/L (ref 98–111)
Creatinine, Ser: 0.65 mg/dL (ref 0.44–1.00)
GFR calc Af Amer: >60 mL/min (ref >60)
GFR calc non Af Amer: >60 mL/min (ref >60)
Glucose, Bld: 123 mg/dL — ABNORMAL HIGH (ref 70–99)
Potassium: 4.4 mmol/L (ref 3.5–5.1)
Sodium: 136 mmol/L (ref 135–145)

## 2019-03-19 LAB — CBC
HCT: 45.9 % (ref 36.0–46.0)
Hemoglobin: 14.5 g/dL (ref 12.0–15.0)
MCH: 29.7 pg (ref 26.0–34.0)
MCHC: 31.6 g/dL (ref 30.0–36.0)
MCV: 93.9 fL (ref 80.0–100.0)
Platelets: 258 10*3/uL (ref 150–400)
RBC: 4.89 MIL/uL (ref 3.87–5.11)
RDW: 13.5 % (ref 11.5–15.5)
WBC: 13.2 10*3/uL — ABNORMAL HIGH (ref 4.0–10.5)
nRBC: 0 % (ref 0.0–0.2)

## 2019-03-19 LAB — TROPONIN I (HIGH SENSITIVITY): Troponin I (High Sensitivity): 6 ng/L (ref <18)

## 2019-03-19 MED ORDER — SODIUM CHLORIDE 0.9% FLUSH: 3.0000 mL | Freq: Once | INTRAVENOUS | Status: DC

## 2019-03-19 NOTE — ED Provider Notes (Addendum)
Medical screening examination/treatment/procedure(s) were conducted as a shared visit with non-physician practitioner(s) and myself.  I personally evaluated the patient during the encounter.  EKG Interpretation  Date/Time:  Thursday March 19 2019 19:41:48 EST Ventricular Rate:  75 PR Interval:  164 QRS Duration: 68 QT Interval:  392 QTC Calculation: 437 R Axis:   79 Text Interpretation: Normal sinus rhythm Low voltage QRS Borderline ECG No prior ECG for comparison. No STEMI Confirmed by Antony Blackbird [phone]) on 03/19/2019 10:53:10 PM  Patient is a 68 year old female with history of PE on Eliquis who presents to the emergency department after 2 seizures at home.  One was unwitnessed but patient had incontinence and tongue biting.  Second was witnessed and sounds like a generalized tonic-clonic seizure with postictal state afterwards.  Family reports he has had 2 previous seizures but never seen a neurologist and has never been on antiepileptics.  Back to her baseline.  Complains of chest pain.  2 normal troponins.  CT scan shows no new PE.  CT does show bibasilar atelectasis versus new infectious process.  She does have a mild leukocytosis of 12,000 which could be reactive from seizure today.  She has not had any fevers, cough.  Will hold antibiotics at this time and monitor for further symptoms at home.  Covid swab is pending.  Head CT unremarkable.  Labs and urine unremarkable.  Neurology consulted and recommended starting patient on Keppra.  Patient and family were have been instructed to not allow patient to drive at this time and that she will need outpatient neurology follow-up.    Aaima Gaddie, Delice Bison, DO 03/20/19 0357   Dropped to 84% on RA.  Does not wear O2 at home.  Will start abx for possible PNA.  COVID pending.  Will admit to medicine.     Derriana Oser, Delice Bison, DO 03/20/19 [phone]

## 2019-03-19 NOTE — ED Provider Notes (Signed)
[facility] EMERGENCY DEPARTMENT Provider Note   CSN: [id number] Arrival date & time: 03/19/19  1921     History   Chief Complaint Chief Complaint  Patient presents with  . Seizures  . Chest Pain    HPI Eileen Morgan is a 68 y.o. female.     68 year old female with possible history of COPD, PE, seizures brought in by EMS with her son for seizure today.  Patient son states that she was mopping at the house today when she came and got him and told him that she thought she had had a seizure because she bit her tongue and was incontinent of urine.  Patient applied topical medication to her tongue to help stop the bleeding and then laid back down on the sofa to rest.  Patient's son states that she was resting on the sofa when she had another episode of full body shaking lasting about 1 minute and bit her time again and then seemed confused and sleepy after this episode.  Son called EMS due to patient remaining sleepy, patient was transported to the ER and placed in triage.  While patient was getting reassessed in triage she seemed to "space out" and was then placed in a room.  Patient son states that she was last admitted to hospital in [place] about 1 year ago with a seizure, was found to have bilateral PEs at that time and was on a blood thinner for a period of time, not currently anticoagulated.  Son states that patient does not have a formal seizure diagnosis however seems to have 1 seizure each year at this time of the year.  Patient complains of pain around her chest and her bra area.  States that she has had this discomfort off and on all day today.  Patient's son reports that she seems to breathe very loudly especially at night, he is unsure if this is from her COPD, states that she does use an inhaler regularly.  Patient denies any other complaints or concerns.     Past Medical History:  Diagnosis Date  . COPD (chronic obstructive pulmonary disease) ([place])   .  Seizures ([place])     There are no active problems to display for this patient.   OB History   No obstetric history on file.      Home Medications    Prior to Admission medications   Not on File    Family History No family history on file.  Social History Social History   Tobacco Use  . Smoking status: Not on file  Substance Use Topics  . Alcohol use: Not on file  . Drug use: Not on file     Allergies   Penicillins   Review of Systems Review of Systems  Constitutional: Negative for fever.  Respiratory: Negative for cough, chest tightness and shortness of breath.   Cardiovascular: Positive for chest pain. Negative for palpitations and leg swelling.  Gastrointestinal: Negative for abdominal pain, nausea and vomiting.  Genitourinary: Negative for dysuria.  Musculoskeletal: Negative for arthralgias and myalgias.  Skin: Negative for rash and wound.  Allergic/Immunologic: Negative for immunocompromised state.  Neurological: Positive for seizures. Negative for dizziness, weakness and headaches.  Psychiatric/Behavioral: Negative for confusion.  All other systems reviewed and are negative.    Physical Exam Updated Vital Signs BP 140/79 (BP Location: Right Arm)   Pulse 70   Temp 98.7 F (37.1 C) (Oral)   Resp 18   Ht 5\' 4"  (1.626  m)   Wt 81.6 kg   SpO2 92%   BMI 30.90 kg/m   Physical Exam Vitals signs and nursing note reviewed.  Constitutional:      General: She is not in acute distress.    Appearance: She is well-developed. She is not diaphoretic.  HENT:     Head: Normocephalic and atraumatic.     Mouth/Throat:     Comments: Patient's tongue is purple due to the violent medication they applied for her bleeding tongue.  She does have small amount of dried blood around her lips. Neck:     Musculoskeletal: Neck supple.  Cardiovascular:     Rate and Rhythm: Normal rate and regular rhythm.  Pulmonary:     Effort: Pulmonary effort is normal.     Breath  sounds: Normal breath sounds.  Chest:     Chest wall: No tenderness.  Abdominal:     Palpations: Abdomen is soft.     Tenderness: There is no abdominal tenderness.  Musculoskeletal:     Right lower leg: She exhibits no tenderness. No edema.     Left lower leg: She exhibits no tenderness. No edema.  Skin:    General: Skin is warm and dry.     Findings: No erythema or rash.  Neurological:     General: No focal deficit present.     Mental Status: She is alert and oriented to person, place, and time.     Cranial Nerves: No cranial nerve deficit.     Motor: No weakness.  Psychiatric:        Behavior: Behavior normal.      ED Treatments / Results  Labs (all labs ordered are listed, but only abnormal results are displayed) Labs Reviewed  BASIC METABOLIC PANEL - Abnormal; Notable for the following components:      Result Value   Glucose, Bld 123 (*)    All other components within normal limits  CBC - Abnormal; Notable for the following components:   WBC 13.2 (*)    All other components within normal limits  CBG MONITORING, ED  TROPONIN I (HIGH SENSITIVITY)  TROPONIN I (HIGH SENSITIVITY)    EKG EKG Interpretation  Date/Time:  Thursday March 19 2019 19:41:48 EST Ventricular Rate:  75 PR Interval:  164 QRS Duration: 68 QT Interval:  392 QTC Calculation: 437 R Axis:   79 Text Interpretation: Normal sinus rhythm Low voltage QRS Borderline ECG No prior ECG for comparison. No STEMI Confirmed by Antony Blackbird [phone]) on 03/19/2019 10:53:10 PM   Radiology Dg Chest 2 View  Result Date: 03/19/2019 CLINICAL DATA:  Chest pain. EXAM: CHEST - 2 VIEW COMPARISON:  None. FINDINGS: The heart size and mediastinal contours are within normal limits. Normal pulmonary vascularity. Mild bibasilar atelectasis. No focal consolidation, pleural effusion, or pneumothorax. No acute osseous abnormality. Chronic mid to lower thoracic compression deformity. IMPRESSION: No active cardiopulmonary  disease. Electronically Signed   By: Titus Dubin M.D.   On: 03/19/2019 20:13    Procedures Procedures (including critical care time)  Medications Ordered in ED Medications  sodium chloride flush (NS) 0.9 % injection 3 mL (has no administration in time range)     Initial Impression / Assessment and Plan / ED Course  I have reviewed the triage vital signs and the nursing notes.  Pertinent labs & imaging results that were available during my care of the patient were reviewed by me and considered in my medical decision making (see chart for details).  Clinical Course  as of Mar 19 426  Fri Nov 13, 626  7519 68 year old female brought in by EMS from home for report of 2 seizures at home described as whole body shaking incontinent of urine with biting tongue.  Patient with postictal.  However back at baseline at this point.  Son also concerned about patient's complaint of chest discomfort and the last time she had seizures she was admitted to the hospital and found to have PE.  CT chest is negative for PE, no significant findings on CT head.  CT chest with question of early infection.  Review of lab work, CBC mildly elevated at 13,000, BMP without significant findings.  Troponin x2 without significant changes, no acute ischemic changes on EKG.  Awaiting urinalysis. Patient has been seen by Dr. Leonides Schanz, ER attending, recommends consult with neurology.  Case discussed with Dr. Leonel Ramsay with neurology who recommends Keppra 500 mg twice daily and neurology outpatient follow-up.   [LM]  [phone] Patient is sleeping, easily aroused to verbal stimuli and subsequently awake, states that she sees a PCP back in [place], does not have a local PCP.  Plan was to ambulate patient prior to discharge however when patient's nurse discontinued her nasal cannula, her oxygen saturation dropped into the mid to low 80s.  Patient was placed back on oxygen via nasal cannula and hospitalist was paged for consult for  admission for new oxygen requirement.   [LM]  [id number] Case discussed with Dr. Hal Hope, hospitalist, will consult for admission.    [LM]  0426 Eileen Morgan was evaluated in Emergency Department on 03/20/2019 for the symptoms described in the history of present illness. She was evaluated in the context of the global COVID-19 pandemic, which necessitated consideration that the patient might be at risk for infection with the SARS-CoV-2 virus that causes COVID-19. Institutional protocols and algorithms that pertain to the evaluation of patients at risk for COVID-19 are in a state of rapid change based on information released by regulatory bodies including the CDC and federal and state organizations. These policies and algorithms were followed during the patient's care in the ED.     [LM]    Clinical Course User Index [LM] Tacy Learn, PA-C      Final Clinical Impressions(s) / ED Diagnoses   Final diagnoses:  None    ED Discharge Orders    None       Tacy Learn, PA-C 03/20/19 [id number]    Tegeler, Gwenyth Allegra, MD 03/20/19 1402

## 2019-03-19 NOTE — ED Notes (Signed)
Pt's son approached this tech stating that his mom "wasn't responding" to him. Pt looked like she was sleeping when this tech approached and opened her eyes when her name was called but would not answer any questions or squeeze tech's hands when asked to. RN, Kathie Rhodes., was notified and came out from triage to assess the pt. At that point patient was placed on a hospital bed and brought to a hallway bed.

## 2019-03-19 NOTE — ED Triage Notes (Signed)
Per Son pt has two witnessed seizures about an hour ago. Reports she usually has low O2 after a seizure (also has COPD),. Placed on 2L [place] in triage.  Pt reports last seizure was a year ago.  Pt was incontinent but is alert and oriented at this time.

## 2019-03-20 ENCOUNTER — Emergency Department (HOSPITAL_COMMUNITY): Payer: Medicare Other

## 2019-03-20 ENCOUNTER — Encounter (HOSPITAL_COMMUNITY): Payer: Self-pay | Admitting: Internal Medicine

## 2019-03-20 ENCOUNTER — Other Ambulatory Visit: Payer: Self-pay

## 2019-03-20 ENCOUNTER — Inpatient Hospital Stay (HOSPITAL_COMMUNITY): Payer: Medicare Other

## 2019-03-20 DIAGNOSIS — J189 Pneumonia, unspecified organism: Secondary | ICD-10-CM | POA: Diagnosis present

## 2019-03-20 DIAGNOSIS — R569 Unspecified convulsions: Secondary | ICD-10-CM | POA: Diagnosis not present

## 2019-03-20 DIAGNOSIS — J9601 Acute respiratory failure with hypoxia: Secondary | ICD-10-CM | POA: Diagnosis present

## 2019-03-20 DIAGNOSIS — Z86711 Personal history of pulmonary embolism: Secondary | ICD-10-CM | POA: Diagnosis not present

## 2019-03-20 DIAGNOSIS — J441 Chronic obstructive pulmonary disease with (acute) exacerbation: Secondary | ICD-10-CM | POA: Diagnosis present

## 2019-03-20 DIAGNOSIS — F17200 Nicotine dependence, unspecified, uncomplicated: Secondary | ICD-10-CM | POA: Diagnosis present

## 2019-03-20 DIAGNOSIS — Z880 Allergy status to penicillin: Secondary | ICD-10-CM | POA: Diagnosis not present

## 2019-03-20 DIAGNOSIS — J440 Chronic obstructive pulmonary disease with acute lower respiratory infection: Secondary | ICD-10-CM | POA: Diagnosis present

## 2019-03-20 DIAGNOSIS — Z7901 Long term (current) use of anticoagulants: Secondary | ICD-10-CM | POA: Diagnosis not present

## 2019-03-20 DIAGNOSIS — R0902 Hypoxemia: Secondary | ICD-10-CM | POA: Diagnosis present

## 2019-03-20 DIAGNOSIS — Z20828 Contact with and (suspected) exposure to other viral communicable diseases: Secondary | ICD-10-CM | POA: Diagnosis present

## 2019-03-20 DIAGNOSIS — J9611 Chronic respiratory failure with hypoxia: Secondary | ICD-10-CM | POA: Diagnosis present

## 2019-03-20 LAB — HIV ANTIBODY (ROUTINE TESTING W REFLEX): HIV Screen 4th Generation wRfx: NONREACTIVE

## 2019-03-20 LAB — RAPID URINE DRUG SCREEN, HOSP PERFORMED
Amphetamines: NOT DETECTED
Barbiturates: NOT DETECTED
Benzodiazepines: NOT DETECTED
Cocaine: NOT DETECTED
Opiates: NOT DETECTED
Tetrahydrocannabinol: NOT DETECTED

## 2019-03-20 LAB — CBC
HCT: 39.9 % (ref 36.0–46.0)
Hemoglobin: 13 g/dL (ref 12.0–15.0)
MCH: 30.4 pg (ref 26.0–34.0)
MCHC: 32.6 g/dL (ref 30.0–36.0)
MCV: 93.2 fL (ref 80.0–100.0)
Platelets: 231 10*3/uL (ref 150–400)
RBC: 4.28 MIL/uL (ref 3.87–5.11)
RDW: 13.8 % (ref 11.5–15.5)
WBC: 8.6 10*3/uL (ref 4.0–10.5)
nRBC: 0 % (ref 0.0–0.2)

## 2019-03-20 LAB — URINALYSIS, ROUTINE W REFLEX MICROSCOPIC
Bilirubin Urine: NEGATIVE
Glucose, UA: NEGATIVE mg/dL
Hgb urine dipstick: NEGATIVE
Ketones, ur: NEGATIVE mg/dL
Leukocytes,Ua: NEGATIVE
Nitrite: NEGATIVE
Protein, ur: NEGATIVE mg/dL
Specific Gravity, Urine: >1.046 — ABNORMAL HIGH (ref 1.005–1.030)
pH: 6 (ref 5.0–8.0)

## 2019-03-20 LAB — SARS CORONAVIRUS 2 (TAT 6-24 HRS): SARS Coronavirus 2: NEGATIVE

## 2019-03-20 LAB — CREATININE, SERUM
Creatinine, Ser: 0.75 mg/dL (ref 0.44–1.00)
GFR calc Af Amer: >60 mL/min (ref >60)
GFR calc non Af Amer: >60 mL/min (ref >60)

## 2019-03-20 LAB — STREP PNEUMONIAE URINARY ANTIGEN: Strep Pneumo Urinary Antigen: NEGATIVE

## 2019-03-20 LAB — TROPONIN I (HIGH SENSITIVITY): Troponin I (High Sensitivity): 9 ng/L (ref <18)

## 2019-03-20 MED ORDER — IPRATROPIUM-ALBUTEROL 0.5-2.5 (3) MG/3ML IN SOLN
3.0000 mL | Freq: Three times a day (TID) | RESPIRATORY_TRACT | Status: DC
  Administered 2019-03-20: 3 mL via RESPIRATORY_TRACT
  Filled 2019-03-20: qty 3

## 2019-03-20 MED ORDER — IPRATROPIUM-ALBUTEROL 0.5-2.5 (3) MG/3ML IN SOLN
3.0000 mL | Freq: Two times a day (BID) | RESPIRATORY_TRACT | Status: DC
  Administered 2019-03-21: 3 mL via RESPIRATORY_TRACT
  Filled 2019-03-20 (×2): qty 3

## 2019-03-20 MED ORDER — LEVETIRACETAM 500 MG PO TABS
500.0000 mg | ORAL_TABLET | Freq: Two times a day (BID) | ORAL | Status: DC
  Administered 2019-03-20 – 2019-03-22 (×5): 500 mg via ORAL
  Filled 2019-03-20 (×5): qty 1

## 2019-03-20 MED ORDER — LEVOFLOXACIN 500 MG PO TABS
750.0000 mg | ORAL_TABLET | Freq: Every day | ORAL | Status: DC
  Administered 2019-03-21 – 2019-03-22 (×2): 750 mg via ORAL
  Filled 2019-03-20 (×2): qty 2

## 2019-03-20 MED ORDER — LEVOFLOXACIN IN D5W 750 MG/150ML IV SOLN: 750.0000 mg | INTRAVENOUS | Status: DC

## 2019-03-20 MED ORDER — IOHEXOL 350 MG/ML SOLN
75.0000 mL | Freq: Once | INTRAVENOUS | Status: AC | PRN
  Administered 2019-03-20: 75 mL via INTRAVENOUS

## 2019-03-20 MED ORDER — LEVOFLOXACIN 750 MG PO TABS
750.0000 mg | ORAL_TABLET | Freq: Once | ORAL | Status: AC
  Administered 2019-03-20: 750 mg via ORAL
  Filled 2019-03-20: qty 1

## 2019-03-20 MED ORDER — LEVETIRACETAM IN NACL 1000 MG/100ML IV SOLN
1000.0000 mg | Freq: Once | INTRAVENOUS | Status: AC
  Administered 2019-03-20: 1000 mg via INTRAVENOUS
  Filled 2019-03-20: qty 100

## 2019-03-20 MED ORDER — ACETAMINOPHEN 650 MG RE SUPP: 650.0000 mg | Freq: Four times a day (QID) | RECTAL | Status: DC | PRN

## 2019-03-20 MED ORDER — GUAIFENESIN ER 600 MG PO TB12
600.0000 mg | ORAL_TABLET | Freq: Two times a day (BID) | ORAL | Status: DC
  Administered 2019-03-20 – 2019-03-22 (×4): 600 mg via ORAL
  Filled 2019-03-20 (×4): qty 1

## 2019-03-20 MED ORDER — ENOXAPARIN SODIUM 40 MG/0.4ML ~~LOC~~ SOLN
40.0000 mg | SUBCUTANEOUS | Status: DC
  Administered 2019-03-20 – 2019-03-22 (×3): 40 mg via SUBCUTANEOUS
  Filled 2019-03-20 (×3): qty 0.4

## 2019-03-20 MED ORDER — IPRATROPIUM-ALBUTEROL 0.5-2.5 (3) MG/3ML IN SOLN: 3.0000 mL | Freq: Three times a day (TID) | RESPIRATORY_TRACT | Status: DC

## 2019-03-20 MED ORDER — PREDNISONE 20 MG PO TABS
50.0000 mg | ORAL_TABLET | Freq: Every day | ORAL | Status: DC
  Administered 2019-03-20 – 2019-03-22 (×3): 50 mg via ORAL
  Filled 2019-03-20: qty 2
  Filled 2019-03-20: qty 3
  Filled 2019-03-20: qty 2

## 2019-03-20 MED ORDER — ONDANSETRON HCL 4 MG PO TABS: 4.0000 mg | ORAL_TABLET | Freq: Four times a day (QID) | ORAL | Status: DC | PRN

## 2019-03-20 MED ORDER — ALBUTEROL SULFATE (2.5 MG/3ML) 0.083% IN NEBU: 2.5000 mg | INHALATION_SOLUTION | RESPIRATORY_TRACT | Status: DC | PRN

## 2019-03-20 MED ORDER — ONDANSETRON HCL 4 MG/2ML IJ SOLN: 4.0000 mg | Freq: Four times a day (QID) | INTRAMUSCULAR | Status: DC | PRN

## 2019-03-20 MED ORDER — ACETAMINOPHEN 325 MG PO TABS: 650.0000 mg | ORAL_TABLET | Freq: Four times a day (QID) | ORAL | Status: DC | PRN

## 2019-03-20 NOTE — ED Notes (Signed)
RN attempted to call report x1 

## 2019-03-20 NOTE — Progress Notes (Addendum)
  Patient seen and evaluated, chart reviewed, please see EMR for updated orders. Please see full H&P dictated by admitting physician Dr. Hal Hope for same date of service.   Brief Summary:- 68 y.o. female with history of tobacco abuse and COPD with previous history of pulmonary embolism last evaluation was in [place] with history of seizure not on any medication was brought to the ER after patient had 2 seizures at home  A/p 1)PNA--- continue Levaquin, Covid negative -bronchodilators, mucolytics and supplemental oxygen  2)Seizures--- patient with prior history of seizures, at least 2 episodes of seizures in the last 24 hours with incontinence and tongue biting - CT head unremarkable, MRI brain pending, EEG without epileptic findings -Admitting physician discussed case with on-call neurologist Dr. Leonel Ramsay --who advised Keppra and outpatient follow-up  3)Acute Hypoxic Respiratory Failure--- suspect secondary to #1 above in the setting of underlying COPD and ongoing tobacco abuse, try to wean off O2  4)COPD and Tobacco Abuse----treat empirically with steroids, bronchodilators, mucolytics and supplemental oxygen   Plan of care and findings discussed with patient's son at bedside, questions answered

## 2019-03-20 NOTE — Procedures (Signed)
Patient Name: Delena Whitcomb  MRN: [id number]  Epilepsy Attending: Lora Havens  Referring Physician/Provider: Dr Roxan Hockey Date: 03/20/2019 Duration: 24.46 mins  Patient history: 68yo F wih h/o seizure who presented with seizure. EEG to evaluate for seizure  Level of alertness: awake  AEDs during EEG study: LEV  Technical aspects: This EEG study was done with scalp electrodes positioned according to the 10-20 International system of electrode placement. Electrical activity was acquired at a sampling rate of 500Hz  and reviewed with a high frequency filter of 70Hz  and a low frequency filter of 1Hz . EEG data were recorded continuously and digitally stored.   DESCRIPTION: During awake state, the posterior dominant rhythm consists of 11-12 Hz activity of moderate voltage (25-35 uV) seen predominantly in posterior head regions, symmetric and reactive to eye opening and eye closing. Sleep was characterized by vertex waves, sleep spindles (12-14Hz ) maximal frontocentral. Physiologic photic driving was seen during photic stimulation. Hyperventilation was not performed.  IMPRESSION: This study is within normal limits. No seizures or epileptiform discharges were seen throughout the recording.  Amberlea Spagnuolo Barbra Sarks

## 2019-03-20 NOTE — H&P (Addendum)
History and Physical    Flornce Morgan [id number] DOB: 24-Feb-1951 DOA: 03/19/2019  PCP: Patient, No Pcp Per  Patient coming from: Home.  Chief Complaint: Seizures.  History obtained from patient's son and patient.  HPI: Eileen Morgan is a 68 y.o. female with history of tobacco abuse and COPD with previous history of pulmonary embolism last evaluation was in [place] with history of seizure not on any medication was brought to the ER after patient had 2 seizures at home.  The first 1 happened around while patient was sleeping and woke up around 4 PM yesterday after patient had a tongue bite and incontinence of urine.  Patient stated to her son that she probably had a seizure.  20 minutes later patient was having a witnessed seizure by patient's son which was tonic-clonic lasted for a minute and a half following which patient was postictal for about 10 minutes.  During which patient again had a tongue bite and incontinence of urine.  Was brought to the ER.  Patient otherwise denies any chest pain shortness of breath nausea vomiting or diarrhea.  Denies any headache or focal deficit.  ED Course: In the ER patient appeared nonfocal.  CT head unremarkable.  Patient was hypoxic and a CT angiogram of the chest was done which was possibility of infiltrates.  Lab work show WBC count of 13.2 high-sensitivity troponin of 6 and 9.  COVID-19 test is pending.  EKG shows normal sinus rhythm.  Drug screen negative.  ER physician had discussed with on-call neurologist Dr. Leonel Ramsay who advised to give Keppra and discharged home on fentanyl twice daily and further work-up as outpatient including MRI and EEG.  However since patient became persistently hypoxic at to be placed on 2 L oxygen with CT angiogram showing possibility of pneumonia will start on antibiotics COVID-19 test was sent in which is pending admitted for further observation.  Review of Systems: As per HPI, rest all negative.   Past Medical  History:  Diagnosis Date   COPD (chronic obstructive pulmonary disease) ([place])    Pulmonary embolism ([place])    Seizures ([place])     History reviewed. No pertinent surgical history.   reports that she has been smoking. She has never used smokeless tobacco. She reports that she does not use drugs. No history on file for alcohol.  Allergies  Allergen Reactions   Penicillins Anaphylaxis    Family History  Problem Relation Age of Onset   CAD Neg Hx    Diabetes Mellitus II Neg Hx     Prior to Admission medications   Not on File    Physical Exam: Constitutional: Moderately built and nourished. Vitals:   03/20/19 0415 03/20/19 0430 03/20/19 0445 03/20/19 0509  BP: (!) 122/58 134/74 114/61   Pulse: 67 82 68   Resp: 19 16 19    Temp:    99.4 F (37.4 C)  TempSrc:    Oral  SpO2: 93% 95% 95%   Weight:      Height:       Eyes: Anicteric no pallor. ENMT: No discharge from the ears eyes nose or mouth. Neck: No mass felt.  No neck rigidity. Respiratory: No rhonchi or crepitations. Cardiovascular: S1-S2 heard. Abdomen: Soft nontender bowel sounds present. Musculoskeletal: No edema. Skin: No rash. Neurologic: Alert awake oriented to time place and person.  Moves all extremities. Psychiatric: Appears normal.   Labs on Admission: I have personally reviewed following labs and imaging studies  CBC: Recent Labs  Lab 03/19/19  1954  WBC 13.2*  HGB 14.5  HCT 45.9  MCV 93.9  PLT 0000000   Basic Metabolic Panel: Recent Labs  Lab 03/19/19 1954  NA 136  K 4.4  CL 101  CO2 23  GLUCOSE 123*  BUN 12  CREATININE 0.65  CALCIUM 9.0   GFR: Estimated Creatinine Clearance: 67.7 mL/min (by C-G formula based on SCr of 0.65 mg/dL). Liver Function Tests: No results for input(s): AST, ALT, ALKPHOS, BILITOT, PROT, ALBUMIN in the last 168 hours. No results for input(s): LIPASE, AMYLASE in the last 168 hours. No results for input(s): AMMONIA in the last 168 hours. Coagulation  Profile: No results for input(s): INR, PROTIME in the last 168 hours. Cardiac Enzymes: No results for input(s): CKTOTAL, CKMB, CKMBINDEX, TROPONINI in the last 168 hours. BNP (last 3 results) No results for input(s): PROBNP in the last 8760 hours. HbA1C: No results for input(s): HGBA1C in the last 72 hours. CBG: No results for input(s): GLUCAP in the last 168 hours. Lipid Profile: No results for input(s): CHOL, HDL, LDLCALC, TRIG, CHOLHDL, LDLDIRECT in the last 72 hours. Thyroid Function Tests: No results for input(s): TSH, T4TOTAL, FREET4, T3FREE, THYROIDAB in the last 72 hours. Anemia Panel: No results for input(s): VITAMINB12, FOLATE, FERRITIN, TIBC, IRON, RETICCTPCT in the last 72 hours. Urine analysis:    Component Value Date/Time   COLORURINE YELLOW 03/20/2019 0221   APPEARANCEUR CLEAR 03/20/2019 0221   LABSPEC >1.046 (H) 03/20/2019 0221   PHURINE 6.0 03/20/2019 0221   GLUCOSEU NEGATIVE 03/20/2019 0221   HGBUR NEGATIVE 03/20/2019 0221   BILIRUBINUR NEGATIVE 03/20/2019 0221   KETONESUR NEGATIVE 03/20/2019 0221   PROTEINUR NEGATIVE 03/20/2019 0221   NITRITE NEGATIVE 03/20/2019 0221   LEUKOCYTESUR NEGATIVE 03/20/2019 0221   Sepsis Labs: @LABRCNTIP (procalcitonin:4,lacticidven:4) )No results found for this or any previous visit (from the past 240 hour(s)).   Radiological Exams on Admission: Dg Chest 2 View  Result Date: 03/19/2019 CLINICAL DATA:  Chest pain. EXAM: CHEST - 2 VIEW COMPARISON:  None. FINDINGS: The heart size and mediastinal contours are within normal limits. Normal pulmonary vascularity. Mild bibasilar atelectasis. No focal consolidation, pleural effusion, or pneumothorax. No acute osseous abnormality. Chronic mid to lower thoracic compression deformity. IMPRESSION: No active cardiopulmonary disease. Electronically Signed   By: Titus Dubin M.D.   On: 03/19/2019 20:13   Ct Head Wo Contrast  Result Date: 03/20/2019 CLINICAL DATA:  Seizure EXAM: CT HEAD  WITHOUT CONTRAST TECHNIQUE: Contiguous axial images were obtained from the base of the skull through the vertex without intravenous contrast. COMPARISON:  None. FINDINGS: Brain: There is no mass, hemorrhage or extra-axial collection. The size and configuration of the ventricles and extra-axial CSF spaces are normal. The brain parenchyma is normal, without acute or chronic infarction. Vascular: No abnormal hyperdensity of the major intracranial arteries or dural venous sinuses. No intracranial atherosclerosis. Skull: The visualized skull base, calvarium and extracranial soft tissues are normal. Sinuses/Orbits: No fluid levels or advanced mucosal thickening of the visualized paranasal sinuses. No mastoid or middle ear effusion. The orbits are normal. IMPRESSION: Normal head CT. Electronically Signed   By: Ulyses Jarred M.D.   On: 03/20/2019 00:17   Ct Angio Chest Pe W/cm &/or Wo Cm  Result Date: 03/20/2019 CLINICAL DATA:  COPD, shortness of breath, PE suspected EXAM: CT ANGIOGRAPHY CHEST WITH CONTRAST TECHNIQUE: Multidetector CT imaging of the chest was performed using the standard protocol during bolus administration of intravenous contrast. Multiplanar CT image reconstructions and MIPs were obtained to evaluate the  vascular anatomy. CONTRAST:  23mL OMNIPAQUE IOHEXOL 350 MG/ML SOLN COMPARISON:  None. FINDINGS: Cardiovascular: There is a optimal opacification of the pulmonary arteries. There is no central,segmental, or subsegmental filling defects within the pulmonary arteries. The heart is normal in size. No pericardial effusion or thickening. No evidence right heart strain. There is normal three-vessel brachiocephalic anatomy without proximal stenosis. Scattered aortic atherosclerotic calcifications are seen without aneurysmal dilatation. Mild Mediastinum/Nodes: No hilar, mediastinal, or axillary adenopathy. A moderate hiatal hernia present. Lungs/Pleura: Extensive foot the centrilobular emphysematous changes  seen at both lung apices. There is streaky opacity seen at both lung bases. No pleural effusion. Upper Abdomen: No acute abnormalities present in the visualized portions of the upper abdomen. Musculoskeletal: No chest wall abnormality. No acute or significant osseous findings. Slight superior chronic compression deformities seen within the T6, T7, T11, T12 vertebral bodies with less than 25% loss of vertebral body height. Review of the MIP images confirms the above findings. IMPRESSION: 1.  No central, segmental, or subsegmental pulmonary embolism. 2. Streaky opacity at both lung bases which could be due to atelectasis and/or early infectious etiology. 3. Moderate hiatal hernia 4. Age indeterminate, however likely chronic appearing superior compression deformities as described above. Electronically Signed   By: Prudencio Pair M.D.   On: 03/20/2019 00:36    EKG: Independently reviewed.  Normal sinus rhythm.  Assessment/Plan Principal Problem:   Acute respiratory failure with hypoxia (HCC) Active Problems:   Seizure ([place])    1. Acute respiratory failure with hypoxia likely from possible pneumonia COVID-19 test is pending.  Replacement home empiric antibiotics for now.  Closely observe. 2. Seizures -I discussed with on-call neurologist Dr. Leonel Ramsay who advised to keep patient on Keppra 5 at twice daily and outpatient follow-up with neurologist including need for EEG and MRI brain. 3. COPD not actively wheezing. 4. Tobacco abuse -advised about quitting.   DVT prophylaxis: Lovenox. Code Status: Full code. Family Communication: Discussed with patient's son. Disposition Plan: Home. Consults called: None. Admission status: Observation.   Rise Patience MD [facility] Pager [phone].  If 7PM-7AM, please contact night-coverage [email or web address] Password [email or web address]  03/20/2019, 5:52 AM

## 2019-03-20 NOTE — ED Notes (Signed)
Heart healthy dinner tray ordered 

## 2019-03-20 NOTE — ED Notes (Signed)
Pt says she is not clusterphobic

## 2019-03-20 NOTE — ED Notes (Signed)
Removed pt from [place] to trial room air. Pt desated to 84%. Fully awoke patient and brought back of stretcher up. Pt still had spo2 at 85%. Reapplied [place] at 3lpm. spo2 came up to  94%. PA notified. Will continue to monitor.

## 2019-03-20 NOTE — ED Notes (Signed)
Patient transported to MRI 

## 2019-03-20 NOTE — ED Notes (Signed)
Patient tried on room air but did not maintain level about 88% Will place her back on 2NC

## 2019-03-20 NOTE — Progress Notes (Signed)
EEG Completed; Results Pending  

## 2019-03-20 NOTE — ED Notes (Signed)
Lunch Tray ordered 

## 2019-03-20 NOTE — Progress Notes (Signed)
Pharmacy Antibiotic Note  Zynasia Gieck is a 68 y.o. female admitted on 03/19/2019 with pneumonia.  Pharmacy has been consulted for Levaquin dosing given PCN allergy w/ anaphylaxis.  Plan: Levaquin 750mg  IV Q24H; f/u when to transition to PO.  Height: 5\' 4"  (162.6 cm) Weight: 180 lb (81.6 kg) IBW/kg (Calculated) : 54.7  Temp (24hrs), Avg:99.1 F (37.3 C), Min:98.7 F (37.1 C), Max:99.4 F (37.4 C)  Recent Labs  Lab 03/19/19 1954  WBC 13.2*  CREATININE 0.65    Estimated Creatinine Clearance: 67.7 mL/min (by C-G formula based on SCr of 0.65 mg/dL).    Allergies  Allergen Reactions  . Penicillins Anaphylaxis    Thank you for allowing pharmacy to be a part of this patient's care.  Wynona Neat, PharmD, BCPS  03/20/2019 6:00 AM

## 2019-03-20 NOTE — ED Notes (Signed)
ED TO INPATIENT HANDOFF REPORT  ED Nurse Name and Phone #: [phone] Lauree Chandler., RN  S Name/Age/Gender Lenn Cal 68 y.o. female Room/Bed: 019C/019C  Code Status   Code Status: Full Code  Home/SNF/Other Home Patient oriented to: self, place, time and situation Is this baseline? Yes   Triage Complete: Triage complete  Chief Complaint seizures   Triage Note Per Son pt has two witnessed seizures about an hour ago. Reports she usually has low O2 after a seizure (also has COPD),. Placed on 2L [place] in triage.  Pt reports last seizure was a year ago.  Pt was incontinent but is alert and oriented at this time.     Allergies Allergies  Allergen Reactions  . Penicillins Anaphylaxis    Level of Care/Admitting Diagnosis ED Disposition    ED Disposition Condition Comment   Admit  Hospital Area: [facility] [[id number]]  Level of Care: Telemetry Medical [104]  Covid Evaluation: Confirmed COVID Negative  Diagnosis: PNA (pneumonia) [id number]  Admitting Physician: Morrison Old  Attending Physician: Morrison Old  Estimated length of stay: 3 - 4 days  Certification:: I certify this patient will need inpatient services for at least 2 midnights  Bed request comments: Tele  PT Class (Do Not Modify): Inpatient [101]  PT Acc Code (Do Not Modify): Private [1]       B Medical/Surgery History Past Medical History:  Diagnosis Date  . COPD (chronic obstructive pulmonary disease) ([place])   . Pulmonary embolism ([place])   . Seizures ([place])    History reviewed. No pertinent surgical history.   A IV Location/Drains/Wounds Patient Lines/Drains/Airways Status   Active Line/Drains/Airways    Name:   Placement date:   Placement time:   Site:   Days:   Peripheral IV 03/19/19 Left Antecubital   03/19/19    2344    Antecubital   1          Intake/Output Last 24 hours No intake or output data in the 24 hours ending 03/20/19 2001  Labs/Imaging Results  for orders placed or performed during the hospital encounter of 03/19/19 (from the past 48 hour(s))  Basic metabolic panel     Status: Abnormal   Collection Time: 03/19/19  7:54 PM  Result Value Ref Range   Sodium 136 135 - 145 mmol/L   Potassium 4.4 3.5 - 5.1 mmol/L   Chloride 101 98 - 111 mmol/L   CO2 23 22 - 32 mmol/L   Glucose, Bld 123 (H) 70 - 99 mg/dL   BUN 12 8 - 23 mg/dL   Creatinine, Ser 0.65 0.44 - 1.00 mg/dL   Calcium 9.0 8.9 - 10.3 mg/dL   GFR calc non Af Amer >60 >60 mL/min   GFR calc Af Amer >60 >60 mL/min   Anion gap 12 5 - 15    Comment: Performed at [facility] [address], [place], [place] [address]  CBC     Status: Abnormal   Collection Time: 03/19/19  7:54 PM  Result Value Ref Range   WBC 13.2 (H) 4.0 - 10.5 K/uL   RBC 4.89 3.87 - 5.11 MIL/uL   Hemoglobin 14.5 12.0 - 15.0 g/dL   HCT 45.9 36.0 - 46.0 %   MCV 93.9 80.0 - 100.0 fL   MCH 29.7 26.0 - 34.0 pg   MCHC 31.6 30.0 - 36.0 g/dL   RDW 13.5 11.5 - 15.5 %   Platelets 258 150 - 400 K/uL   nRBC  0.0 0.0 - 0.2 %    Comment: Performed at [facility], [place] [address], [place], [place] [address]  Troponin I (High Sensitivity)     Status: None   Collection Time: 03/19/19  7:54 PM  Result Value Ref Range   Troponin I (High Sensitivity) 6 <18 ng/L    Comment: (NOTE) Elevated high sensitivity troponin I (hsTnI) values and significant  changes across serial measurements may suggest ACS but many other  chronic and acute conditions are known to elevate hsTnI results.  Refer to the "Links" section for chest pain algorithms and additional  guidance. Performed at [facility], [place] [address] [place], [place] [address]   Troponin I (High Sensitivity)     Status: None   Collection Time: 03/19/19  9:44 PM  Result Value Ref Range   Troponin I (High Sensitivity) 9 <18 ng/L    Comment: (NOTE) Elevated high sensitivity troponin I (hsTnI) values and significant  changes across serial measurements  may suggest ACS but many other  chronic and acute conditions are known to elevate hsTnI results.  Refer to the "Links" section for chest pain algorithms and additional  guidance. Performed at [facility], [place] [address], [place], [place] [address]   SARS CORONAVIRUS 2 (TAT 6-24 HRS) Nasopharyngeal Urine, Catheterized     Status: None   Collection Time: 03/20/19  2:05 AM   Specimen: Urine, Catheterized; Nasopharyngeal  Result Value Ref Range   SARS Coronavirus 2 NEGATIVE NEGATIVE    Comment: (NOTE) SARS-CoV-2 target nucleic acids are NOT DETECTED. The SARS-CoV-2 RNA is generally detectable in upper and lower respiratory specimens during the acute phase of infection. Negative results do not preclude SARS-CoV-2 infection, do not rule out co-infections with other pathogens, and should not be used as the sole basis for treatment or other patient management decisions. Negative results must be combined with clinical observations, patient history, and epidemiological information. The expected result is Negative. Fact Sheet for Patients: SugarRoll.be Fact Sheet for Healthcare Providers: https://www.woods-mathews.com/ This test is not yet approved or cleared by the [place] FDA and  has been authorized for detection and/or diagnosis of SARS-CoV-2 by FDA under an Emergency Use Authorization (EUA). This EUA will remain  in effect (meaning this test can be used) for the duration of the COVID-19 declaration under Section 56 4(b)(1) of the Act, 21 U.S.C. section 360bbb-3(b)(1), unless the authorization is terminated or revoked sooner. Performed at [facility], [place] [address], [place], [place] [address]   Strep pneumoniae urinary antigen     Status: None   Collection Time: 03/20/19  2:05 AM  Result Value Ref Range   Strep Pneumo Urinary Antigen NEGATIVE NEGATIVE    Comment:        Infection due to S. pneumoniae cannot be absolutely  ruled out since the antigen present may be below the detection limit of the test. Performed at [facility], [place] [address] [place], [place] [address]   Urinalysis, Routine w reflex microscopic     Status: Abnormal   Collection Time: 03/20/19  2:21 AM  Result Value Ref Range   Color, Urine YELLOW YELLOW   APPearance CLEAR CLEAR   Specific Gravity, Urine >1.046 (H) 1.005 - 1.030   pH 6.0 5.0 - 8.0   Glucose, UA NEGATIVE NEGATIVE mg/dL   Hgb urine dipstick NEGATIVE NEGATIVE   Bilirubin Urine NEGATIVE NEGATIVE   Ketones, ur NEGATIVE NEGATIVE mg/dL   Protein, ur NEGATIVE  NEGATIVE mg/dL   Nitrite NEGATIVE NEGATIVE   Leukocytes,Ua NEGATIVE NEGATIVE    Comment: Performed at [facility], [place] [address], [place], [place] [address]  Urine rapid drug screen (hosp performed)not at [facility]     Status: None   Collection Time: 03/20/19  2:21 AM  Result Value Ref Range   Opiates NONE DETECTED NONE DETECTED   Cocaine NONE DETECTED NONE DETECTED   Benzodiazepines NONE DETECTED NONE DETECTED   Amphetamines NONE DETECTED NONE DETECTED   Tetrahydrocannabinol NONE DETECTED NONE DETECTED   Barbiturates NONE DETECTED NONE DETECTED    Comment: (NOTE) DRUG SCREEN FOR MEDICAL PURPOSES ONLY.  IF CONFIRMATION IS NEEDED FOR ANY PURPOSE, NOTIFY LAB WITHIN 5 DAYS. LOWEST DETECTABLE LIMITS FOR URINE DRUG SCREEN Drug Class                     Cutoff (ng/mL) Amphetamine and metabolites    1000 Barbiturate and metabolites    200 Benzodiazepine                 [id number] Tricyclics and metabolites     300 Opiates and metabolites        300 Cocaine and metabolites        300 THC                            50 Performed at [facility], [place] [address] [place], [place] [address]   Blood culture (routine x 2)     Status: None (Preliminary result)   Collection Time: 03/20/19  5:04 AM   Specimen: BLOOD  Result Value Ref Range   Specimen Description BLOOD LEFT ANTECUBITAL    Special Requests       BOTTLES DRAWN AEROBIC AND ANAEROBIC Blood Culture results may not be optimal due to an excessive volume of blood received in culture bottles   Culture      NO GROWTH < 12 HOURS Performed at [facility] [address] [place],  [address]    Report Status PENDING   Blood culture (routine x 2)     Status: None (Preliminary result)   Collection Time: 03/20/19  5:06 AM   Specimen: BLOOD RIGHT HAND  Result Value Ref Range   Specimen Description BLOOD RIGHT HAND    Special Requests      BOTTLES DRAWN AEROBIC AND ANAEROBIC Blood Culture adequate volume   Culture      NO GROWTH < 12 HOURS Performed at [facility], [place] [address] [place],  [address]    Report Status PENDING   HIV Antibody (routine testing w rflx)     Status: None   Collection Time: 03/20/19  6:13 AM  Result Value Ref Range   HIV Screen 4th Generation wRfx NON REACTIVE NON REACTIVE    Comment: Performed at [facility], [place] [address] [place], [place] [address]  CBC     Status: None   Collection Time: 03/20/19  6:13 AM  Result Value Ref Range   WBC 8.6 4.0 - 10.5 K/uL   RBC 4.28 3.87 - 5.11 MIL/uL   Hemoglobin 13.0 12.0 - 15.0 g/dL   HCT 39.9 36.0 - 46.0 %   MCV 93.2 80.0 - 100.0 fL   MCH 30.4 26.0 - 34.0 pg   MCHC 32.6 30.0 - 36.0 g/dL   RDW 13.8 11.5 - 15.5 %   Platelets 231 150 -  400 K/uL   nRBC 0.0 0.0 - 0.2 %    Comment: Performed at [facility], [place] [address], [place], [place] [address]  Creatinine, serum     Status: None   Collection Time: 03/20/19  6:13 AM  Result Value Ref Range   Creatinine, Ser 0.75 0.44 - 1.00 mg/dL   GFR calc non Af Amer >60 >60 mL/min   GFR calc Af Amer >60 >60 mL/min    Comment: Performed at [facility] [address] [place], [place] [address]   Dg Chest 2 View  Result Date: 03/19/2019 CLINICAL DATA:  Chest pain. EXAM: CHEST - 2 VIEW COMPARISON:  None. FINDINGS: The heart size and mediastinal contours are within normal limits.  Normal pulmonary vascularity. Mild bibasilar atelectasis. No focal consolidation, pleural effusion, or pneumothorax. No acute osseous abnormality. Chronic mid to lower thoracic compression deformity. IMPRESSION: No active cardiopulmonary disease. Electronically Signed   By: Titus Dubin M.D.   On: 03/19/2019 20:13   Ct Head Wo Contrast  Result Date: 03/20/2019 CLINICAL DATA:  Seizure EXAM: CT HEAD WITHOUT CONTRAST TECHNIQUE: Contiguous axial images were obtained from the base of the skull through the vertex without intravenous contrast. COMPARISON:  None. FINDINGS: Brain: There is no mass, hemorrhage or extra-axial collection. The size and configuration of the ventricles and extra-axial CSF spaces are normal. The brain parenchyma is normal, without acute or chronic infarction. Vascular: No abnormal hyperdensity of the major intracranial arteries or dural venous sinuses. No intracranial atherosclerosis. Skull: The visualized skull base, calvarium and extracranial soft tissues are normal. Sinuses/Orbits: No fluid levels or advanced mucosal thickening of the visualized paranasal sinuses. No mastoid or middle ear effusion. The orbits are normal. IMPRESSION: Normal head CT. Electronically Signed   By: Ulyses Jarred M.D.   On: 03/20/2019 00:17   Ct Angio Chest Pe W/cm &/or Wo Cm  Result Date: 03/20/2019 CLINICAL DATA:  COPD, shortness of breath, PE suspected EXAM: CT ANGIOGRAPHY CHEST WITH CONTRAST TECHNIQUE: Multidetector CT imaging of the chest was performed using the standard protocol during bolus administration of intravenous contrast. Multiplanar CT image reconstructions and MIPs were obtained to evaluate the vascular anatomy. CONTRAST:  47mL OMNIPAQUE IOHEXOL 350 MG/ML SOLN COMPARISON:  None. FINDINGS: Cardiovascular: There is a optimal opacification of the pulmonary arteries. There is no central,segmental, or subsegmental filling defects within the pulmonary arteries. The heart is normal in size. No  pericardial effusion or thickening. No evidence right heart strain. There is normal three-vessel brachiocephalic anatomy without proximal stenosis. Scattered aortic atherosclerotic calcifications are seen without aneurysmal dilatation. Mild Mediastinum/Nodes: No hilar, mediastinal, or axillary adenopathy. A moderate hiatal hernia present. Lungs/Pleura: Extensive foot the centrilobular emphysematous changes seen at both lung apices. There is streaky opacity seen at both lung bases. No pleural effusion. Upper Abdomen: No acute abnormalities present in the visualized portions of the upper abdomen. Musculoskeletal: No chest wall abnormality. No acute or significant osseous findings. Slight superior chronic compression deformities seen within the T6, T7, T11, T12 vertebral bodies with less than 25% loss of vertebral body height. Review of the MIP images confirms the above findings. IMPRESSION: 1.  No central, segmental, or subsegmental pulmonary embolism. 2. Streaky opacity at both lung bases which could be due to atelectasis and/or early infectious etiology. 3. Moderate hiatal hernia 4. Age indeterminate, however likely chronic appearing superior compression deformities as described above. Electronically Signed   By: Prudencio Pair M.D.   On: 03/20/2019 00:36   Mr Brain  Wo Contrast  Result Date: 03/20/2019 CLINICAL DATA:  Seizure EXAM: MRI HEAD WITHOUT CONTRAST TECHNIQUE: Multiplanar, multiecho pulse sequences of the brain and surrounding structures were obtained without intravenous contrast. COMPARISON:  Head CT same FINDINGS: Brain: Diffusion imaging does not show acute or subacute infarction or other cause restricted diffusion. Brainstem and cerebellum are. Cerebral hemispheres show mild chronic small-vessel change of white matter. No or large vessel territory infarction. No mass lesion, hemorrhage hydrocephalus or extra-axial collection. Mesial temporal lobes appear symmetric. The study does suffer from motion  degradation. Vascular: Major vessels at the base of the brain show flow. Skull and upper cervical spine: Negative Sinuses/Orbits: Paranasal sinuses are clear. Previous functional endoscopic sinus surgery. Orbits are negative. Bilateral mastoid effusions are noted. Other: None IMPRESSION: No acute finding. No cause seizure is identified. Chronic small-vessel change of the cerebral hemispheric white matter, fairly typical for age. Electronically Signed   By: Nelson Chimes M.D.   On: 03/20/2019 16:28    Pending Labs Unresulted Labs (From admission, onward)    Start     Ordered   03/21/19 [id number]  Basic metabolic panel  Tomorrow morning,   R     03/20/19 0546   03/21/19 0500  CBC  Tomorrow morning,   R     03/20/19 0546   03/20/19 0544  Legionella Pneumophila Serogp 1 Ur Ag  Once,   AD     03/20/19 0546          Vitals/Pain Today's Vitals   03/20/19 1828 03/20/19 1830 03/20/19 1845 03/20/19 1900  BP:    (!) 123/42  Pulse:  70 67 71  Resp:  20 20 (!) 22  Temp:      TempSrc:      SpO2:  100% 98%   Weight:      Height:      PainSc: 0-No pain       Isolation Precautions No active isolations  Medications Medications  sodium chloride flush (NS) 0.9 % injection 3 mL (3 mLs Intravenous Not Given 03/20/19 0159)  acetaminophen (TYLENOL) tablet 650 mg (has no administration in time range)    Or  acetaminophen (TYLENOL) suppository 650 mg (has no administration in time range)  ondansetron (ZOFRAN) tablet 4 mg (has no administration in time range)    Or  ondansetron (ZOFRAN) injection 4 mg (has no administration in time range)  enoxaparin (LOVENOX) injection 40 mg (40 mg Subcutaneous Given 03/20/19 1110)  levETIRAcetam (KEPPRA) tablet 500 mg (500 mg Oral Given 03/20/19 1109)  levofloxacin (LEVAQUIN) tablet 750 mg (has no administration in time range)  predniSONE (DELTASONE) tablet 50 mg (50 mg Oral Given 03/20/19 1822)  guaiFENesin (MUCINEX) 12 hr tablet 600 mg (600 mg Oral Given 03/20/19  1822)  albuterol (PROVENTIL) (2.5 MG/3ML) 0.083% nebulizer solution 2.5 mg (has no administration in time range)  ipratropium-albuterol (DUONEB) 0.5-2.5 (3) MG/3ML nebulizer solution 3 mL (has no administration in time range)  iohexol (OMNIPAQUE) 350 MG/ML injection 75 mL (75 mLs Intravenous Contrast Given 03/20/19 0018)  levETIRAcetam (KEPPRA) IVPB 1000 mg/100 mL premix (0 mg Intravenous Stopped 03/20/19 0254)  levofloxacin (LEVAQUIN) tablet 750 mg (750 mg Oral Given 03/20/19 0423)    Mobility walks High fall risk   Focused Assessments Neuro Assessment Handoff:  Swallow screen pass? n/a Cardiac Rhythm: Normal sinus rhythm NIH Stroke Scale ( + Modified Stroke Scale Criteria)  LOC Questions (1b. )   +: Answers both questions correctly LOC Commands (1c. )   + : Performs both  tasks correctly Best Gaze (2. )  +: Normal Visual (3. )  +: No visual loss Motor Arm, Left (5a. )   +: No drift Motor Arm, Right (5b. )   +: No drift Motor Leg, Left (6a. )   +: No drift Motor Leg, Right (6b. )   +: No drift Sensory (8. )   +: Normal, no sensory loss Best Language (9. )   +: No aphasia Extinction/Inattention (11.)   +: No Abnormality Modified SS Total  +: 0     Neuro Assessment: Exceptions to WDL Neuro Checks:      Last Documented NIHSS Modified Score: 0 (03/20/19 0316) Has TPA been given? No If patient is a Neuro Trauma and patient is going to OR before floor call report to [facility] nurse: [phone] or [phone]     R Recommendations: See Admitting Provider Note  Report given to:   Additional Notes:

## 2019-03-21 DIAGNOSIS — J441 Chronic obstructive pulmonary disease with (acute) exacerbation: Secondary | ICD-10-CM | POA: Diagnosis present

## 2019-03-21 LAB — BASIC METABOLIC PANEL
Anion gap: 9 (ref 5–15)
BUN: 15 mg/dL (ref 8–23)
CO2: 26 mmol/L (ref 22–32)
Calcium: 9 mg/dL (ref 8.9–10.3)
Chloride: 102 mmol/L (ref 98–111)
Creatinine, Ser: 0.75 mg/dL (ref 0.44–1.00)
GFR calc Af Amer: >60 mL/min (ref >60)
GFR calc non Af Amer: >60 mL/min (ref >60)
Glucose, Bld: 169 mg/dL — ABNORMAL HIGH (ref 70–99)
Potassium: 4.6 mmol/L (ref 3.5–5.1)
Sodium: 137 mmol/L (ref 135–145)

## 2019-03-21 LAB — CBC
HCT: 41.5 % (ref 36.0–46.0)
Hemoglobin: 13.3 g/dL (ref 12.0–15.0)
MCH: 29.6 pg (ref 26.0–34.0)
MCHC: 32 g/dL (ref 30.0–36.0)
MCV: 92.4 fL (ref 80.0–100.0)
Platelets: 233 10*3/uL (ref 150–400)
RBC: 4.49 MIL/uL (ref 3.87–5.11)
RDW: 13.4 % (ref 11.5–15.5)
WBC: 5.9 10*3/uL (ref 4.0–10.5)
nRBC: 0 % (ref 0.0–0.2)

## 2019-03-21 MED ORDER — ORAL CARE MOUTH RINSE
15.0000 mL | Freq: Two times a day (BID) | OROMUCOSAL | Status: DC
  Administered 2019-03-21: 15 mL via OROMUCOSAL

## 2019-03-21 MED ORDER — CHLORHEXIDINE GLUCONATE 0.12 % MT SOLN
15.0000 mL | Freq: Two times a day (BID) | OROMUCOSAL | Status: DC
  Administered 2019-03-21 – 2019-03-22 (×3): 15 mL via OROMUCOSAL
  Filled 2019-03-21 (×4): qty 15

## 2019-03-21 MED ORDER — IPRATROPIUM-ALBUTEROL 0.5-2.5 (3) MG/3ML IN SOLN
3.0000 mL | Freq: Four times a day (QID) | RESPIRATORY_TRACT | Status: DC
  Administered 2019-03-21 – 2019-03-22 (×2): 3 mL via RESPIRATORY_TRACT
  Filled 2019-03-21 (×2): qty 3

## 2019-03-21 MED ORDER — CHLORHEXIDINE GLUCONATE 0.12% ORAL RINSE (MEDLINE KIT): 15.0000 mL | Freq: Two times a day (BID) | OROMUCOSAL | Status: DC

## 2019-03-21 MED ORDER — ORAL CARE MOUTH RINSE: 15.0000 mL | OROMUCOSAL | Status: DC

## 2019-03-21 NOTE — Progress Notes (Signed)
   Patient Saturations on Room Air at Rest =94%  Patient Saturations on [employer] while Ambulating =85 %  Patient Saturations on2Liters of oxygen while Ambulating = 93 %  Patient needs continuous O2 at 2 L/min continuously via nasal cannula with humidifier, with gaseous portability and conserving device

## 2019-03-21 NOTE — Progress Notes (Signed)
SATURATION QUALIFICATIONS: (This note is used to comply with regulatory documentation for home oxygen)  Patient Saturations on Room Air at Rest = 94%  Patient Saturations on Room Air while Ambulating = 85%   

## 2019-03-21 NOTE — Progress Notes (Signed)
Patient Demographics:    Eileen Morgan, is a 68 y.o. female, DOB - 1951-01-31, [id number]  Admit date - 03/19/2019   Admitting Physician Akua Blethen Denton Brick, MD  Outpatient Primary MD for the patient is Patient, No Pcp Per  LOS - 1   Chief Complaint  Patient presents with  . Seizures  . Chest Pain        Subjective:    Eileen Morgan today has no fevers, no emesis,  No chest pain,  * -Son at bedside No further seizures, -Cough and shortness of breath persist, hypoxia with minimum exertion persist  Assessment  & Plan :    Principal Problem:   Acute respiratory failure with hypoxia (HCC) Active Problems:   Seizure (HCC)   PNA (pneumonia)   COPD with acute exacerbation ([place])   Brief Summary:- 68 y.o.femalewithhistory of tobacco abuse and COPD with previous history of pulmonary embolism last evaluation was in [place] with history of seizure not on any medication was brought to the ER after patient had 2 seizures at home -Now with persistent hypoxia with minimum exertion  A/p 1)PNA---  community-acquired pneumonia-- patient continues to have cough shortness of breath and hypoxia, treat empirically with  Levaquin as patient has penicillin allergy, Covid negative -bronchodilators, mucolytics and supplemental oxygen  2)Seizures--- patient with prior history of seizures, at least 2 episodes of seizures in the last 24 hours with incontinence and tongue biting - CT head unremarkable, MRI brain pending, EEG without epileptic findings -Admitting physician discussed case with on-call neurologist Dr. Leonel Ramsay --who advised Keppra 500 mg twice daily and outpatient follow-up  3)Acute Hypoxic Respiratory Failure--- suspect secondary to #1 above in the setting of underlying COPD and ongoing tobacco abuse--may need home O2 -Order for home O2 placed in anticipation of possible need upon discharge   4)COPD and Tobacco Abuse----remains symptomatic with hypoxia, cough and dyspnea -continue steroids, bronchodilators, mucolytics and supplemental oxygen and Levaquin -Complete smoking cessation strongly advised   Disposition/Need for in-Hospital Stay- patient unable to be discharged at this time due to --hypoxia, dyspnea and respiratory concerns -Anticipate discharge home on 03/22/2019 with home oxygen (orders for home O2 has been placed O2 sats qualification documentation already completed)  Code Status : full  Family Communication:   (patient is alert, awake and coherent) -Discussed with patient's son at bedside  Disposition Plan  : home on 03/22/19 if continues to improve  Consults  :  na  DVT Prophylaxis  :  Lovenox  - SCDs   Lab Results  Component Value Date   PLT 233 03/21/2019    Inpatient Medications  Scheduled Meds: . chlorhexidine  15 mL Mouth Rinse BID  . enoxaparin (LOVENOX) injection  40 mg Subcutaneous Q24H  . guaiFENesin  600 mg Oral BID  . ipratropium-albuterol  3 mL Nebulization BID  . levETIRAcetam  500 mg Oral BID  . levofloxacin  750 mg Oral Daily  . mouth rinse  15 mL Mouth Rinse q12n4p  . predniSONE  50 mg Oral Q breakfast  . sodium chloride flush  3 mL Intravenous Once   Continuous Infusions: PRN Meds:.acetaminophen **OR** acetaminophen, albuterol, ondansetron **OR** ondansetron (ZOFRAN) IV    Anti-infectives (From admission, onward)   Start     Dose/Rate Route Frequency  Ordered Stop   03/21/19 0800  levofloxacin (LEVAQUIN) tablet 750 mg     750 mg Oral Daily 03/20/19 1127     03/21/19 0000  levofloxacin (LEVAQUIN) IVPB 750 mg  Status:  Discontinued     750 mg 100 mL/hr over 90 Minutes Intravenous Every 24 hours 03/20/19 0559 03/20/19 1127   03/20/19 0415  levofloxacin (LEVAQUIN) tablet 750 mg     750 mg Oral  Once 03/20/19 0405 03/20/19 0423        Objective:   Vitals:   03/20/19 2119 03/21/19 0556 03/21/19 0804 03/21/19 1202  BP:  124/67 137/75 (!) 124/94 (!) 119/58  Pulse: 69 (!) 57 (!) 59 64  Resp:  16 19 17   Temp: 98.4 F (36.9 C) 98.2 F (36.8 C)  97.8 F (36.6 C)  TempSrc: Oral Oral  Oral  SpO2: 95% 96%  94%  Weight:  84.3 kg    Height:        Wt Readings from Last 3 Encounters:  03/21/19 84.3 kg     Intake/Output Summary (Last 24 hours) at 03/21/2019 1827 Last data filed at 03/21/2019 1200 Gross per 24 hour  Intake 840 ml  Output -  Net 840 ml    Physical Exam  Gen:- Awake Alert, dyspnea on exertion but no conversational dyspnea HEENT:- [place].AT, No sclera icterus Nose- [place] 2 L/min Neck-Supple Neck,No JVD,.  Lungs-diminished in bases bilaterally, scattered wheezes in upper lung fields few scattered rhonchi in bases CV- S1, S2 normal, regular  Abd-  +ve B.Sounds, Abd Soft, No tenderness,    Extremity/Skin:- No  edema, pedal pulses present  Psych-affect is appropriate, oriented x3 Neuro-no new focal deficits, no tremors   Data Review:   Micro Results Recent Results (from the past 240 hour(s))  SARS CORONAVIRUS 2 (TAT 6-24 HRS) Nasopharyngeal Urine, Catheterized     Status: None   Collection Time: 03/20/19  2:05 AM   Specimen: Urine, Catheterized; Nasopharyngeal  Result Value Ref Range Status   SARS Coronavirus 2 NEGATIVE NEGATIVE Final    Comment: (NOTE) SARS-CoV-2 target nucleic acids are NOT DETECTED. The SARS-CoV-2 RNA is generally detectable in upper and lower respiratory specimens during the acute phase of infection. Negative results do not preclude SARS-CoV-2 infection, do not rule out co-infections with other pathogens, and should not be used as the sole basis for treatment or other patient management decisions. Negative results must be combined with clinical observations, patient history, and epidemiological information. The expected result is Negative. Fact Sheet for Patients: SugarRoll.be Fact Sheet for Healthcare Providers:  https://www.woods-mathews.com/ This test is not yet approved or cleared by the [place] FDA and  has been authorized for detection and/or diagnosis of SARS-CoV-2 by FDA under an Emergency Use Authorization (EUA). This EUA will remain  in effect (meaning this test can be used) for the duration of the COVID-19 declaration under Section 56 4(b)(1) of the Act, 21 U.S.C. section 360bbb-3(b)(1), unless the authorization is terminated or revoked sooner. Performed at [facility], [place] [address], [place], [place] [address]   Blood culture (routine x 2)     Status: None (Preliminary result)   Collection Time: 03/20/19  5:04 AM   Specimen: BLOOD  Result Value Ref Range Status   Specimen Description BLOOD LEFT ANTECUBITAL  Final   Special Requests   Final    BOTTLES DRAWN AEROBIC AND ANAEROBIC Blood Culture results may not be optimal due to an excessive volume of blood received in culture bottles  Culture   Final    NO GROWTH 1 DAY Performed at [facility], [place] [address], [place], [place] [address]    Report Status PENDING  Incomplete  Blood culture (routine x 2)     Status: None (Preliminary result)   Collection Time: 03/20/19  5:06 AM   Specimen: BLOOD RIGHT HAND  Result Value Ref Range Status   Specimen Description BLOOD RIGHT HAND  Final   Special Requests   Final    BOTTLES DRAWN AEROBIC AND ANAEROBIC Blood Culture adequate volume   Culture   Final    NO GROWTH 1 DAY Performed at [facility], [place] [address], [place], [place] [address]    Report Status PENDING  Incomplete    Radiology Reports Dg Chest 2 View  Result Date: 03/19/2019 CLINICAL DATA:  Chest pain. EXAM: CHEST - 2 VIEW COMPARISON:  None. FINDINGS: The heart size and mediastinal contours are within normal limits. Normal pulmonary vascularity. Mild bibasilar atelectasis. No focal consolidation, pleural effusion, or pneumothorax. No acute osseous abnormality. Chronic mid to lower  thoracic compression deformity. IMPRESSION: No active cardiopulmonary disease. Electronically Signed   By: Titus Dubin M.D.   On: 03/19/2019 20:13   Ct Head Wo Contrast  Result Date: 03/20/2019 CLINICAL DATA:  Seizure EXAM: CT HEAD WITHOUT CONTRAST TECHNIQUE: Contiguous axial images were obtained from the base of the skull through the vertex without intravenous contrast. COMPARISON:  None. FINDINGS: Brain: There is no mass, hemorrhage or extra-axial collection. The size and configuration of the ventricles and extra-axial CSF spaces are normal. The brain parenchyma is normal, without acute or chronic infarction. Vascular: No abnormal hyperdensity of the major intracranial arteries or dural venous sinuses. No intracranial atherosclerosis. Skull: The visualized skull base, calvarium and extracranial soft tissues are normal. Sinuses/Orbits: No fluid levels or advanced mucosal thickening of the visualized paranasal sinuses. No mastoid or middle ear effusion. The orbits are normal. IMPRESSION: Normal head CT. Electronically Signed   By: Ulyses Jarred M.D.   On: 03/20/2019 00:17   Ct Angio Chest Pe W/cm &/or Wo Cm  Result Date: 03/20/2019 CLINICAL DATA:  COPD, shortness of breath, PE suspected EXAM: CT ANGIOGRAPHY CHEST WITH CONTRAST TECHNIQUE: Multidetector CT imaging of the chest was performed using the standard protocol during bolus administration of intravenous contrast. Multiplanar CT image reconstructions and MIPs were obtained to evaluate the vascular anatomy. CONTRAST:  5mL OMNIPAQUE IOHEXOL 350 MG/ML SOLN COMPARISON:  None. FINDINGS: Cardiovascular: There is a optimal opacification of the pulmonary arteries. There is no central,segmental, or subsegmental filling defects within the pulmonary arteries. The heart is normal in size. No pericardial effusion or thickening. No evidence right heart strain. There is normal three-vessel brachiocephalic anatomy without proximal stenosis. Scattered aortic  atherosclerotic calcifications are seen without aneurysmal dilatation. Mild Mediastinum/Nodes: No hilar, mediastinal, or axillary adenopathy. A moderate hiatal hernia present. Lungs/Pleura: Extensive foot the centrilobular emphysematous changes seen at both lung apices. There is streaky opacity seen at both lung bases. No pleural effusion. Upper Abdomen: No acute abnormalities present in the visualized portions of the upper abdomen. Musculoskeletal: No chest wall abnormality. No acute or significant osseous findings. Slight superior chronic compression deformities seen within the T6, T7, T11, T12 vertebral bodies with less than 25% loss of vertebral body height. Review of the MIP images confirms the above findings. IMPRESSION: 1.  No central, segmental, or subsegmental pulmonary embolism. 2. Streaky opacity at both lung bases which could be due to atelectasis and/or early  infectious etiology. 3. Moderate hiatal hernia 4. Age indeterminate, however likely chronic appearing superior compression deformities as described above. Electronically Signed   By: Prudencio Pair M.D.   On: 03/20/2019 00:36   Mr Brain Wo Contrast  Result Date: 03/20/2019 CLINICAL DATA:  Seizure EXAM: MRI HEAD WITHOUT CONTRAST TECHNIQUE: Multiplanar, multiecho pulse sequences of the brain and surrounding structures were obtained without intravenous contrast. COMPARISON:  Head CT same FINDINGS: Brain: Diffusion imaging does not show acute or subacute infarction or other cause restricted diffusion. Brainstem and cerebellum are. Cerebral hemispheres show mild chronic small-vessel change of white matter. No or large vessel territory infarction. No mass lesion, hemorrhage hydrocephalus or extra-axial collection. Mesial temporal lobes appear symmetric. The study does suffer from motion degradation. Vascular: Major vessels at the base of the brain show flow. Skull and upper cervical spine: Negative Sinuses/Orbits: Paranasal sinuses are clear. Previous  functional endoscopic sinus surgery. Orbits are negative. Bilateral mastoid effusions are noted. Other: None IMPRESSION: No acute finding. No cause seizure is identified. Chronic small-vessel change of the cerebral hemispheric white matter, fairly typical for age. Electronically Signed   By: Nelson Chimes M.D.   On: 03/20/2019 16:28     CBC Recent Labs  Lab 03/19/19 1954 03/20/19 0613 03/21/19 0356  WBC 13.2* 8.6 5.9  HGB 14.5 13.0 13.3  HCT 45.9 39.9 41.5  PLT 258 231 233  MCV 93.9 93.2 92.4  MCH 29.7 30.4 29.6  MCHC 31.6 32.6 32.0  RDW 13.5 13.8 13.4    Chemistries  Recent Labs  Lab 03/19/19 1954 03/20/19 0613 03/21/19 0356  NA 136  --  137  K 4.4  --  4.6  CL 101  --  102  CO2 23  --  26  GLUCOSE 123*  --  169*  BUN 12  --  15  CREATININE 0.65 0.75 0.75  CALCIUM 9.0  --  9.0   ------------------------------------------------------------------------------------------------------------------ No results for input(s): CHOL, HDL, LDLCALC, TRIG, CHOLHDL, LDLDIRECT in the last 72 hours.  No results found for: HGBA1C ------------------------------------------------------------------------------------------------------------------ No results for input(s): TSH, T4TOTAL, T3FREE, THYROIDAB in the last 72 hours.  Invalid input(s): FREET3 ------------------------------------------------------------------------------------------------------------------ No results for input(s): VITAMINB12, FOLATE, FERRITIN, TIBC, IRON, RETICCTPCT in the last 72 hours.  Coagulation profile No results for input(s): INR, PROTIME in the last 168 hours.  No results for input(s): DDIMER in the last 72 hours.  Cardiac Enzymes No results for input(s): CKMB, TROPONINI, MYOGLOBIN in the last 168 hours.  Invalid input(s): CK ------------------------------------------------------------------------------------------------------------------ No results found for: BNP   Roxan Hockey M.D on 03/21/2019  at 6:27 PM  Go to [email or web address] - for contact info  [facility] - Office  [phone]

## 2019-03-21 NOTE — Discharge Instructions (Signed)
Per [facility] statutes, patients with seizures are not allowed to drive until they have been seizure-free for six months.   Use caution when using heavy equipment or power tools. Avoid working on ladders or at heights. Take showers instead of baths, Do not lock yourself in a room alone (i.e. bathroom).Ensure the water temperature is not too high on the home water heater. Do not go swimming alone. When caring for infants or small children, sit down when holding, feeding, or changing them to minimize risk of injury to the child in the event you have a seizure.   Do not lock yourself in a room alone (i.e. bathroom).  Maintain good sleep hygiene. Avoid alcohol.    If patient has another seizure, call 911 and bring them back to the ED if: A.  The seizure lasts longer than 5 minutes.      B.  The patient doesn't wake shortly after the seizure or has new problems such as difficulty seeing, speaking or moving following the seizure C.  The patient was injured during the seizure D.  The patient has a temperature over 102 F (39C) E.  The patient vomited during the seizure and now is having trouble breathing

## 2019-03-22 MED ORDER — LEVETIRACETAM 500 MG PO TABS: 500.0000 mg | ORAL_TABLET | Freq: Two times a day (BID) | ORAL | 0 refills | Status: DC

## 2019-03-22 MED ORDER — PREDNISONE 50 MG PO TABS: 50.0000 mg | ORAL_TABLET | Freq: Every day | ORAL | 0 refills | Status: AC

## 2019-03-22 MED ORDER — METHYLPREDNISOLONE SODIUM SUCC 125 MG IJ SOLR: 125.0000 mg | Freq: Once | INTRAMUSCULAR | Status: DC

## 2019-03-22 MED ORDER — LEVOFLOXACIN 750 MG PO TABS: 750.0000 mg | ORAL_TABLET | Freq: Every day | ORAL | 0 refills | Status: AC

## 2019-03-22 MED ORDER — IPRATROPIUM-ALBUTEROL 0.5-2.5 (3) MG/3ML IN SOLN: 3.0000 mL | Freq: Three times a day (TID) | RESPIRATORY_TRACT | Status: DC

## 2019-03-22 NOTE — Progress Notes (Signed)
Pt discharge education provided at bedside with pt and pt son  Pt has all belongings including printed prescriptions and home oxygen Pt IV removed, catheter intact. Pt telemetry removed

## 2019-03-22 NOTE — Discharge Summary (Signed)
Physician Discharge Summary  Eileen Morgan [id number] DOB: 1951-03-31 DOA: 03/19/2019  PCP: Patient, No Pcp Per  Admit date: 03/19/2019 Discharge date: 03/22/2019  Admitted From: Home Disposition: Home  Recommendations for Outpatient Follow-up:  1. Follow up with PCP in 1-2 weeks 2. Please obtain BMP/CBC in one week 3. Please follow up on the following pending results:  Home Health: None Equipment/Devices: Oxygen  Discharge Condition: Stable CODE STATUS: Full code Diet recommendation: Regular  Subjective: Seen and examined.  She feels much better.  No more shortness of breath.  She has not had any seizure anymore.  Brief/Interim Summary: 68 y.o.femalewithhistory of tobacco abuse and COPD with previous history of pulmonary embolism last evaluation was in [place] with history of seizure not on any medication was brought to the ER after patient had 2 seizures at home.  She was started on Keppra after neurology recommendation and she was admitted under hospital service.  EEG was obtained which was unremarkable for any seizure activity.  MRI of brain was also unremarkable.  Patient did not have any other seizure.  She was also diagnosed to have community-acquired pneumonia based on chest x-ray and was also diagnosed to have acute COPD exacerbation based on examination.  She was started on Levaquin as well as prednisone.  Patient is feeling much better and as mentioned above, she has remained seizure-free.  Her ambulatory oxygen was checked yesterday and she was noted to drop her oxygen saturation to 85% on room air and required 2 L of nasal oxygen which is being arranged for her to go home with.  I am also going to send her home on Levaquin for 5 more days and prednisone for 3 more days.  She is going to be discharged on Keppra 500 mg p.o. twice daily per neurology recommendation as mentioned above.  Discharge Diagnoses:  Principal Problem:   Acute respiratory failure with hypoxia  ([place]) Active Problems:   Seizure ([place])   CAP (community acquired pneumonia)   COPD with acute exacerbation [facility])    Discharge Instructions  Discharge Instructions    Discharge patient   Complete by: As directed    Discharge disposition: 01-Home or Self Care   Discharge patient date: 03/22/2019     Allergies as of 03/22/2019      Reactions   Penicillins Anaphylaxis      Medication List    TAKE these medications   levETIRAcetam 500 MG tablet Commonly known as: KEPPRA Take 1 tablet (500 mg total) by mouth 2 (two) times daily.   levofloxacin 750 MG tablet Commonly known as: LEVAQUIN Take 1 tablet (750 mg total) by mouth daily for 5 days.   predniSONE 50 MG tablet Commonly known as: DELTASONE Take 1 tablet (50 mg total) by mouth daily with breakfast for 3 days.            Durable Medical Equipment  (From admission, onward)         Start     Ordered   03/21/19 1829  For home use only DME oxygen  Once    Comments: SATURATION QUALIFICATIONS: (Thisnote is usedto comply with regulatory documentation for home oxygen)  Patient Saturations on Room Air at Rest =94%  Patient Saturations on Room Air while Ambulating =85 %  Patient Saturations on2Liters of oxygen while Ambulating = 93 %  Patient needs continuous O2 at 2 L/min continuously via nasal cannula with humidifier, with gaseous portability and conserving device  Question Answer Comment  Length of Need Lifetime  Liters per Minute 2   Frequency Continuous (stationary and portable oxygen unit needed)   Oxygen conserving device Yes   Oxygen delivery system Gas      03/21/19 1829          Allergies  Allergen Reactions  . Penicillins Anaphylaxis    Consultations: Neurology   Procedures/Studies: Dg Chest 2 View  Result Date: 03/19/2019 CLINICAL DATA:  Chest pain. EXAM: CHEST - 2 VIEW COMPARISON:  None. FINDINGS: The heart size and mediastinal contours are within normal limits. Normal  pulmonary vascularity. Mild bibasilar atelectasis. No focal consolidation, pleural effusion, or pneumothorax. No acute osseous abnormality. Chronic mid to lower thoracic compression deformity. IMPRESSION: No active cardiopulmonary disease. Electronically Signed   By: Titus Dubin M.D.   On: 03/19/2019 20:13   Ct Head Wo Contrast  Result Date: 03/20/2019 CLINICAL DATA:  Seizure EXAM: CT HEAD WITHOUT CONTRAST TECHNIQUE: Contiguous axial images were obtained from the base of the skull through the vertex without intravenous contrast. COMPARISON:  None. FINDINGS: Brain: There is no mass, hemorrhage or extra-axial collection. The size and configuration of the ventricles and extra-axial CSF spaces are normal. The brain parenchyma is normal, without acute or chronic infarction. Vascular: No abnormal hyperdensity of the major intracranial arteries or dural venous sinuses. No intracranial atherosclerosis. Skull: The visualized skull base, calvarium and extracranial soft tissues are normal. Sinuses/Orbits: No fluid levels or advanced mucosal thickening of the visualized paranasal sinuses. No mastoid or middle ear effusion. The orbits are normal. IMPRESSION: Normal head CT. Electronically Signed   By: Ulyses Jarred M.D.   On: 03/20/2019 00:17   Ct Angio Chest Pe W/cm &/or Wo Cm  Result Date: 03/20/2019 CLINICAL DATA:  COPD, shortness of breath, PE suspected EXAM: CT ANGIOGRAPHY CHEST WITH CONTRAST TECHNIQUE: Multidetector CT imaging of the chest was performed using the standard protocol during bolus administration of intravenous contrast. Multiplanar CT image reconstructions and MIPs were obtained to evaluate the vascular anatomy. CONTRAST:  88mL OMNIPAQUE IOHEXOL 350 MG/ML SOLN COMPARISON:  None. FINDINGS: Cardiovascular: There is a optimal opacification of the pulmonary arteries. There is no central,segmental, or subsegmental filling defects within the pulmonary arteries. The heart is normal in size. No  pericardial effusion or thickening. No evidence right heart strain. There is normal three-vessel brachiocephalic anatomy without proximal stenosis. Scattered aortic atherosclerotic calcifications are seen without aneurysmal dilatation. Mild Mediastinum/Nodes: No hilar, mediastinal, or axillary adenopathy. A moderate hiatal hernia present. Lungs/Pleura: Extensive foot the centrilobular emphysematous changes seen at both lung apices. There is streaky opacity seen at both lung bases. No pleural effusion. Upper Abdomen: No acute abnormalities present in the visualized portions of the upper abdomen. Musculoskeletal: No chest wall abnormality. No acute or significant osseous findings. Slight superior chronic compression deformities seen within the T6, T7, T11, T12 vertebral bodies with less than 25% loss of vertebral body height. Review of the MIP images confirms the above findings. IMPRESSION: 1.  No central, segmental, or subsegmental pulmonary embolism. 2. Streaky opacity at both lung bases which could be due to atelectasis and/or early infectious etiology. 3. Moderate hiatal hernia 4. Age indeterminate, however likely chronic appearing superior compression deformities as described above. Electronically Signed   By: Prudencio Pair M.D.   On: 03/20/2019 00:36   Mr Brain Wo Contrast  Result Date: 03/20/2019 CLINICAL DATA:  Seizure EXAM: MRI HEAD WITHOUT CONTRAST TECHNIQUE: Multiplanar, multiecho pulse sequences of the brain and surrounding structures were obtained without intravenous contrast. COMPARISON:  Head CT same FINDINGS: Brain:  Diffusion imaging does not show acute or subacute infarction or other cause restricted diffusion. Brainstem and cerebellum are. Cerebral hemispheres show mild chronic small-vessel change of white matter. No or large vessel territory infarction. No mass lesion, hemorrhage hydrocephalus or extra-axial collection. Mesial temporal lobes appear symmetric. The study does suffer from motion  degradation. Vascular: Major vessels at the base of the brain show flow. Skull and upper cervical spine: Negative Sinuses/Orbits: Paranasal sinuses are clear. Previous functional endoscopic sinus surgery. Orbits are negative. Bilateral mastoid effusions are noted. Other: None IMPRESSION: No acute finding. No cause seizure is identified. Chronic small-vessel change of the cerebral hemispheric white matter, fairly typical for age. Electronically Signed   By: Nelson Chimes M.D.   On: 03/20/2019 16:28      Discharge Exam: Vitals:   03/22/19 0546 03/22/19 0728  BP: 110/60   Pulse: (!) 43   Resp: 16   Temp: 97.8 F (36.6 C)   SpO2: 97% 98%   Vitals:   03/21/19 2046 03/22/19 0023 03/22/19 0546 03/22/19 0728  BP:  122/62 110/60   Pulse: 68 (!) 53 (!) 43   Resp: 16 17 16    Temp:  97.8 F (36.6 C) 97.8 F (36.6 C)   TempSrc:  Oral Oral   SpO2: 94% 98% 97% 98%  Weight:      Height:        General: Pt is alert, awake, not in acute distress Cardiovascular: RRR, S1/S2 +, no rubs, no gallops Respiratory: Scattered expiratory wheezes but no crackles or rhonchi. Abdominal: Soft, NT, ND, bowel sounds + Extremities: no edema, no cyanosis    The results of significant diagnostics from this hospitalization (including imaging, microbiology, ancillary and laboratory) are listed below for reference.     Microbiology: Recent Results (from the past 240 hour(s))  SARS CORONAVIRUS 2 (TAT 6-24 HRS) Nasopharyngeal Urine, Catheterized     Status: None   Collection Time: 03/20/19  2:05 AM   Specimen: Urine, Catheterized; Nasopharyngeal  Result Value Ref Range Status   SARS Coronavirus 2 NEGATIVE NEGATIVE Final    Comment: (NOTE) SARS-CoV-2 target nucleic acids are NOT DETECTED. The SARS-CoV-2 RNA is generally detectable in upper and lower respiratory specimens during the acute phase of infection. Negative results do not preclude SARS-CoV-2 infection, do not rule out co-infections with other  pathogens, and should not be used as the sole basis for treatment or other patient management decisions. Negative results must be combined with clinical observations, patient history, and epidemiological information. The expected result is Negative. Fact Sheet for Patients: SugarRoll.be Fact Sheet for Healthcare Providers: https://www.woods-mathews.com/ This test is not yet approved or cleared by the [place] FDA and  has been authorized for detection and/or diagnosis of SARS-CoV-2 by FDA under an Emergency Use Authorization (EUA). This EUA will remain  in effect (meaning this test can be used) for the duration of the COVID-19 declaration under Section 56 4(b)(1) of the Act, 21 U.S.C. section 360bbb-3(b)(1), unless the authorization is terminated or revoked sooner. Performed at [facility], [place] [address] [place], [place] [address]   Blood culture (routine x 2)     Status: None (Preliminary result)   Collection Time: 03/20/19  5:04 AM   Specimen: BLOOD  Result Value Ref Range Status   Specimen Description BLOOD LEFT ANTECUBITAL  Final   Special Requests   Final    BOTTLES DRAWN AEROBIC AND ANAEROBIC Blood Culture results may not be optimal due to an excessive volume of blood  received in culture bottles   Culture   Final    NO GROWTH 1 DAY Performed at [facility], [place] [address] [place], [place] [address]    Report Status PENDING  Incomplete  Blood culture (routine x 2)     Status: None (Preliminary result)   Collection Time: 03/20/19  5:06 AM   Specimen: BLOOD RIGHT HAND  Result Value Ref Range Status   Specimen Description BLOOD RIGHT HAND  Final   Special Requests   Final    BOTTLES DRAWN AEROBIC AND ANAEROBIC Blood Culture adequate volume   Culture   Final    NO GROWTH 1 DAY Performed at [facility], [place] [address] [place], [place] [address]    Report Status PENDING  Incomplete     Labs: BNP (last  3 results) No results for input(s): BNP in the last 8760 hours. Basic Metabolic Panel: Recent Labs  Lab 03/19/19 1954 03/20/19 0613 03/21/19 0356  NA 136  --  137  K 4.4  --  4.6  CL 101  --  102  CO2 23  --  26  GLUCOSE 123*  --  169*  BUN 12  --  15  CREATININE 0.65 0.75 0.75  CALCIUM 9.0  --  9.0   Liver Function Tests: No results for input(s): AST, ALT, ALKPHOS, BILITOT, PROT, ALBUMIN in the last 168 hours. No results for input(s): LIPASE, AMYLASE in the last 168 hours. No results for input(s): AMMONIA in the last 168 hours. CBC: Recent Labs  Lab 03/19/19 1954 03/20/19 0613 03/21/19 0356  WBC 13.2* 8.6 5.9  HGB 14.5 13.0 13.3  HCT 45.9 39.9 41.5  MCV 93.9 93.2 92.4  PLT 258 231 233   Cardiac Enzymes: No results for input(s): CKTOTAL, CKMB, CKMBINDEX, TROPONINI in the last 168 hours. BNP: Invalid input(s): POCBNP CBG: No results for input(s): GLUCAP in the last 168 hours. D-Dimer No results for input(s): DDIMER in the last 72 hours. Hgb A1c No results for input(s): HGBA1C in the last 72 hours. Lipid Profile No results for input(s): CHOL, HDL, LDLCALC, TRIG, CHOLHDL, LDLDIRECT in the last 72 hours. Thyroid function studies No results for input(s): TSH, T4TOTAL, T3FREE, THYROIDAB in the last 72 hours.  Invalid input(s): FREET3 Anemia work up No results for input(s): VITAMINB12, FOLATE, FERRITIN, TIBC, IRON, RETICCTPCT in the last 72 hours. Urinalysis    Component Value Date/Time   COLORURINE YELLOW 03/20/2019 0221   APPEARANCEUR CLEAR 03/20/2019 0221   LABSPEC >1.046 (H) 03/20/2019 0221   PHURINE 6.0 03/20/2019 0221   GLUCOSEU NEGATIVE 03/20/2019 0221   HGBUR NEGATIVE 03/20/2019 0221   BILIRUBINUR NEGATIVE 03/20/2019 0221   KETONESUR NEGATIVE 03/20/2019 0221   PROTEINUR NEGATIVE 03/20/2019 0221   NITRITE NEGATIVE 03/20/2019 0221   LEUKOCYTESUR NEGATIVE 03/20/2019 0221   Sepsis Labs Invalid input(s): PROCALCITONIN,  WBC,   LACTICIDVEN Microbiology Recent Results (from the past 240 hour(s))  SARS CORONAVIRUS 2 (TAT 6-24 HRS) Nasopharyngeal Urine, Catheterized     Status: None   Collection Time: 03/20/19  2:05 AM   Specimen: Urine, Catheterized; Nasopharyngeal  Result Value Ref Range Status   SARS Coronavirus 2 NEGATIVE NEGATIVE Final    Comment: (NOTE) SARS-CoV-2 target nucleic acids are NOT DETECTED. The SARS-CoV-2 RNA is generally detectable in upper and lower respiratory specimens during the acute phase of infection. Negative results do not preclude SARS-CoV-2 infection, do not rule out co-infections with other pathogens, and should not be used as the sole basis  for treatment or other patient management decisions. Negative results must be combined with clinical observations, patient history, and epidemiological information. The expected result is Negative. Fact Sheet for Patients: SugarRoll.be Fact Sheet for Healthcare Providers: https://www.woods-mathews.com/ This test is not yet approved or cleared by the [place] FDA and  has been authorized for detection and/or diagnosis of SARS-CoV-2 by FDA under an Emergency Use Authorization (EUA). This EUA will remain  in effect (meaning this test can be used) for the duration of the COVID-19 declaration under Section 56 4(b)(1) of the Act, 21 U.S.C. section 360bbb-3(b)(1), unless the authorization is terminated or revoked sooner. Performed at [facility], [place] [address], [place], [place] [address]   Blood culture (routine x 2)     Status: None (Preliminary result)   Collection Time: 03/20/19  5:04 AM   Specimen: BLOOD  Result Value Ref Range Status   Specimen Description BLOOD LEFT ANTECUBITAL  Final   Special Requests   Final    BOTTLES DRAWN AEROBIC AND ANAEROBIC Blood Culture results may not be optimal due to an excessive volume of blood received in culture bottles   Culture   Final    NO GROWTH  1 DAY Performed at [facility], [place] [address], [place], [place] [address]    Report Status PENDING  Incomplete  Blood culture (routine x 2)     Status: None (Preliminary result)   Collection Time: 03/20/19  5:06 AM   Specimen: BLOOD RIGHT HAND  Result Value Ref Range Status   Specimen Description BLOOD RIGHT HAND  Final   Special Requests   Final    BOTTLES DRAWN AEROBIC AND ANAEROBIC Blood Culture adequate volume   Culture   Final    NO GROWTH 1 DAY Performed at [facility], [place] [address] [place], [place] [address]    Report Status PENDING  Incomplete     Time coordinating discharge: Over 30 minutes  SIGNED:   Darliss Cheney, MD  [facility] 03/22/2019, 8:24 AM  If 7PM-7AM, please contact night-coverage [email or web address] Password [email or web address]

## 2019-03-22 NOTE — TOC Transition Note (Signed)
Transition of Care [facility]) - CM/SW Discharge Note   Patient Details  Name: Eileen Morgan MRN: [id number] Date of Birth: 1951-02-03  Transition of [facility]) CM/SW Contact:  Carles Collet, RN Phone Number: 03/22/2019, 8:53 AM   Clinical Narrative:   Notified [place] w [facility] that patient will DC today and needs home oxygen. It will be delivered to room prior to DC.     Final next level of care: Home/Self Care Barriers to Discharge: No Barriers Identified   Patient Goals and CMS Choice        Discharge Placement                       Discharge Plan and Services                DME Arranged: Oxygen DME Agency: [facility] Date DME Agency Contacted: 03/22/19 Time DME Agency Contacted: 0830 Representative spoke with at DME Agency: [place] Determinants of Health (SDOH) Interventions     Readmission Risk Interventions No flowsheet data found.

## 2019-03-22 NOTE — Progress Notes (Signed)
Pt discharged via wheelchair with RN  

## 2019-03-24 LAB — LEGIONELLA PNEUMOPHILA SEROGP 1 UR AG: L. pneumophila Serogp 1 Ur Ag: NEGATIVE

## 2019-03-25 LAB — CULTURE, BLOOD (ROUTINE X 2)
Culture: NO GROWTH
Culture: NO GROWTH
Special Requests: ADEQUATE

## 2019-06-16 ENCOUNTER — Encounter: Payer: Self-pay | Admitting: Family Medicine

## 2019-06-16 ENCOUNTER — Other Ambulatory Visit: Payer: Self-pay

## 2019-06-16 ENCOUNTER — Ambulatory Visit (INDEPENDENT_AMBULATORY_CARE_PROVIDER_SITE_OTHER): Payer: Medicare Other | Admitting: Family Medicine

## 2019-06-16 VITALS — BP 130/70 | HR 62 | Temp 97.7°F | Resp 16 | Ht 64.0 in | Wt 197.0 lb

## 2019-06-16 DIAGNOSIS — J441 Chronic obstructive pulmonary disease with (acute) exacerbation: Secondary | ICD-10-CM

## 2019-06-16 DIAGNOSIS — Z9981 Dependence on supplemental oxygen: Secondary | ICD-10-CM

## 2019-06-16 DIAGNOSIS — Z8669 Personal history of other diseases of the nervous system and sense organs: Secondary | ICD-10-CM

## 2019-06-16 DIAGNOSIS — F17201 Nicotine dependence, unspecified, in remission: Secondary | ICD-10-CM

## 2019-06-16 DIAGNOSIS — R32 Unspecified urinary incontinence: Secondary | ICD-10-CM

## 2019-06-16 DIAGNOSIS — F329 Major depressive disorder, single episode, unspecified: Secondary | ICD-10-CM

## 2019-06-16 DIAGNOSIS — D638 Anemia in other chronic diseases classified elsewhere: Secondary | ICD-10-CM

## 2019-06-16 DIAGNOSIS — F32A Depression, unspecified: Secondary | ICD-10-CM

## 2019-06-16 DIAGNOSIS — K219 Gastro-esophageal reflux disease without esophagitis: Secondary | ICD-10-CM

## 2019-06-16 MED ORDER — LEVETIRACETAM 500 MG PO TABS: 500.0000 mg | ORAL_TABLET | Freq: Two times a day (BID) | ORAL | 5 refills | Status: DC

## 2019-06-16 MED ORDER — OMEPRAZOLE 20 MG PO CPDR: 20.0000 mg | DELAYED_RELEASE_CAPSULE | Freq: Every day | ORAL | 6 refills | Status: DC

## 2019-06-16 MED ORDER — BUPROPION HCL ER (SR) 150 MG PO TB12: 150.0000 mg | ORAL_TABLET | Freq: Two times a day (BID) | ORAL | 0 refills | Status: DC

## 2019-06-16 NOTE — Patient Instructions (Signed)
Will start a trial of Welbutrin for situational depression. Will follow up in 6 weeks.   Bupropion extended-release tablets (Depression/Mood Disorders) What is this medicine? BUPROPION (byoo PROE pee on) is used to treat depression. This medicine may be used for other purposes; ask your health care provider or pharmacist if you have questions. COMMON BRAND NAME(S): Aplenzin, Budeprion XL, Forfivo XL, Wellbutrin XL What should I tell my health care provider before I take this medicine? They need to know if you have any of these conditions:  an eating disorder, such as anorexia or bulimia  bipolar disorder or psychosis  diabetes or high blood sugar, treated with medication  glaucoma  head injury or brain tumor  heart disease, previous heart attack, or irregular heart beat  high blood pressure  kidney or liver disease  seizures (convulsions)  suicidal thoughts or a previous suicide attempt  Tourette's syndrome  weight loss  an unusual or allergic reaction to bupropion, other medicines, foods, dyes, or preservatives  breast-feeding  pregnant or trying to become pregnant How should I use this medicine? Take this medicine by mouth with a glass of water. Follow the directions on the prescription label. You can take it with or without food. If it upsets your stomach, take it with food. Do not crush, chew, or cut these tablets. This medicine is taken once daily at the same time each day. Do not take your medicine more often than directed. Do not stop taking this medicine suddenly except upon the advice of your doctor. Stopping this medicine too quickly may cause serious side effects or your condition may worsen. A special MedGuide will be given to you by the pharmacist with each prescription and refill. Be sure to read this information carefully each time. Talk to your pediatrician regarding the use of this medicine in children. Special care may be needed. Overdosage: If you think  you have taken too much of this medicine contact a poison control center or emergency room at once. NOTE: This medicine is only for you. Do not share this medicine with others. What if I miss a dose? If you miss a dose, skip the missed dose and take your next tablet at the regular time. Do not take double or extra doses. What may interact with this medicine? Do not take this medicine with any of the following medications:  linezolid  MAOIs like Azilect, Carbex, Eldepryl, Marplan, Nardil, and Parnate  methylene blue (injected into a vein)  other medicines that contain bupropion like Zyban This medicine may also interact with the following medications:  alcohol  certain medicines for anxiety or sleep  certain medicines for blood pressure like metoprolol, propranolol  certain medicines for depression or psychotic disturbances  certain medicines for HIV or AIDS like efavirenz, lopinavir, nelfinavir, ritonavir  certain medicines for irregular heart beat like propafenone, flecainide  certain medicines for Parkinson's disease like amantadine, levodopa  certain medicines for seizures like carbamazepine, phenytoin, phenobarbital  cimetidine  clopidogrel  cyclophosphamide  digoxin  furazolidone  isoniazid  nicotine  orphenadrine  procarbazine  steroid medicines like prednisone or cortisone  stimulant medicines for attention disorders, weight loss, or to stay awake  tamoxifen  theophylline  thiotepa  ticlopidine  tramadol  warfarin This list may not describe all possible interactions. Give your health care provider a list of all the medicines, herbs, non-prescription drugs, or dietary supplements you use. Also tell them if you smoke, drink alcohol, or use illegal drugs. Some items may interact with your  medicine. What should I watch for while using this medicine? Tell your doctor if your symptoms do not get better or if they get worse. Visit your doctor or  healthcare provider for regular checks on your progress. Because it may take several weeks to see the full effects of this medicine, it is important to continue your treatment as prescribed by your doctor. This medicine may cause serious skin reactions. They can happen weeks to months after starting the medicine. Contact your healthcare provider right away if you notice fevers or flu-like symptoms with a rash. The rash may be red or purple and then turn into blisters or peeling of the skin. Or, you might notice a red rash with swelling of the face, lips or lymph nodes in your neck or under your arms. Patients and their families should watch out for new or worsening thoughts of suicide or depression. Also watch out for sudden changes in feelings such as feeling anxious, agitated, panicky, irritable, hostile, aggressive, impulsive, severely restless, overly excited and hyperactive, or not being able to sleep. If this happens, especially at the beginning of treatment or after a change in dose, call your healthcare provider. Avoid alcoholic drinks while taking this medicine. Drinking large amounts of alcoholic beverages, using sleeping or anxiety medicines, or quickly stopping the use of these agents while taking this medicine may increase your risk for a seizure. Do not drive or use heavy machinery until you know how this medicine affects you. This medicine can impair your ability to perform these tasks. Do not take this medicine close to bedtime. It may prevent you from sleeping. Your mouth may get dry. Chewing sugarless gum or sucking hard candy, and drinking plenty of water may help. Contact your doctor if the problem does not go away or is severe. The tablet shell for some brands of this medicine does not dissolve. This is normal. The tablet shell may appear whole in the stool. This is not a cause for concern. What side effects may I notice from receiving this medicine? Side effects that you should report  to your doctor or health care professional as soon as possible:  allergic reactions like skin rash, itching or hives, swelling of the face, lips, or tongue  breathing problems  changes in vision  confusion  elevated mood, decreased need for sleep, racing thoughts, impulsive behavior  fast or irregular heartbeat  hallucinations, loss of contact with reality  increased blood pressure  rash, fever, and swollen lymph nodes  redness, blistering, peeling or loosening of the skin, including inside the mouth  seizures  suicidal thoughts or other mood changes  unusually weak or tired  vomiting Side effects that usually do not require medical attention (report to your doctor or health care professional if they continue or are bothersome):  constipation  headache  loss of appetite  nausea  tremors  weight loss This list may not describe all possible side effects. Call your doctor for medical advice about side effects. You may report side effects to FDA at 1-800-FDA-1088. Where should I keep my medicine? Keep out of the reach of children. Store at room temperature between 15 and 30 degrees C (59 and 86 degrees F). Throw away any unused medicine after the expiration date. NOTE: This sheet is a summary. It may not cover all possible information. If you have questions about this medicine, talk to your doctor, pharmacist, or health care provider.  2020 Elsevier/Gold Standard (2018-07-17 13:45:31)  I have sent a referral  to pulmonology for COPD  Referral has been sent to [facility] for bladder leakage.

## 2019-06-16 NOTE — Progress Notes (Signed)
New Patient Office Visit  Subjective:  Patient ID: Eileen Morgan, female    DOB: October 20, 1950  Age: 69 y.o. MRN: [id number]  CC:  Chief Complaint  Patient presents with  . Hospitalization Follow-up    seziure   . Establish Care  . COPD  . Heartburn    HPI Eileen Morgan, a 69 year old female with a medical history significant for COPD, tobacco dependence, also patient has a remote history of pulmonary embolism, and seizure disorder presents to establish care.  Patient recently relocated from [place] and is currently residing in this area with her daughter.  Patient was admitted to inpatient services on 03/20/2019 following 2 seizures at home.  Patient states that the first seizure occurred while the patient was sleeping and awaken around 4 PM.  Patient had a tongue bite and incontinence of urine.  20 minutes following, patient had a witnessed seizure by her son which was tonic-clonic and lasted for a minute and a half.  Patient was postictal for about 10 minutes.  During which patient had a tongue bite and incontinence of urine.  Seizure disorder was improved following hospital visit.  Patient has not had seizures since that time.  She was continued on Keppra for 30 days.  She says that she ran out of [place] about a month ago.  During that time, patient also had a diagnosis of pneumonia and exacerbation of COPD with hypoxia.  Patient is currently on home supplemental oxygen at 2 L.  She is not under the care of pulmonology.  She currently denies fever, chills, headache, chest pain, shortness of breath, urinary symptoms, nausea, vomiting, or diarrhea.  Patient has a history of depression and anxiety.  She says that depression has worsened since relocating to this area.  She spends most time at home isolated and only talks to her daughter and son.  She denies suicidal or homicidal ideation she is currently not on medication for depression or anxiety.  She admits family history.  She endorses  anhedonia and depressed mood.  Patient complaining of occasional bladder leakage.  Mostly occurs with laughing, sneezing, coughing.  Also, experiences occasional incontinence.  She denies urinary frequency, urgency, or retention.  Patient states that this problem has been present for greater than 1 year.  Past Medical History:  Diagnosis Date  . COPD (chronic obstructive pulmonary disease) ([place])   . Pulmonary embolism ([place])   . Seizures ([place])     Past Surgical History:  Procedure Laterality Date  . TONSILLECTOMY    . TUBAL LIGATION      Family History  Problem Relation Age of Onset  . CAD Neg Hx   . Diabetes Mellitus II Neg Hx     Social History   Socioeconomic History  . Marital status: Single    Spouse name: Not on file  . Number of children: Not on file  . Years of education: Not on file  . Highest education level: Not on file  Occupational History  . Not on file  Tobacco Use  . Smoking status: Former Research scientist (life sciences)  . Smokeless tobacco: Never Used  Substance and Sexual Activity  . Alcohol use: Not Currently  . Drug use: Never  . Sexual activity: Not on file  Other Topics Concern  . Not on file  Social History Narrative  . Not on file   Social Determinants of Health   Financial Resource Strain:   . Difficulty of Paying Living Expenses: Not on file  Food Insecurity:   .  Worried About Charity fundraiser in the Last Year: Not on file  . Ran Out of Food in the Last Year: Not on file  Transportation Needs:   . Lack of Transportation (Medical): Not on file  . Lack of Transportation (Non-Medical): Not on file  Physical Activity:   . Days of Exercise per Week: Not on file  . Minutes of Exercise per Session: Not on file  Stress:   . Feeling of Stress : Not on file  Social Connections:   . Frequency of Communication with Friends and Family: Not on file  . Frequency of Social Gatherings with Friends and Family: Not on file  . Attends Religious Services: Not on file  .  Active Member of Clubs or Organizations: Not on file  . Attends Archivist Meetings: Not on file  . Marital Status: Not on file  Intimate Partner Violence:   . Fear of Current or Ex-Partner: Not on file  . Emotionally Abused: Not on file  . Physically Abused: Not on file  . Sexually Abused: Not on file    ROS Review of Systems  Constitutional: Negative for chills and diaphoresis.  HENT: Negative.   Endocrine: Negative for polydipsia, polyphagia and polyuria.  Neurological: Positive for seizures (History of seizure disorder, has not had activity in greater than a month).  Psychiatric/Behavioral: Positive for dysphoric mood.       Patient is complaining about increased depression.  She denies suicidal or homicidal ideations.    Objective:   Today's Vitals: BP 130/70 (BP Location: Right Arm, Patient Position: Sitting, Cuff Size: Large)   Pulse 62   Temp 97.7 F (36.5 C) (Oral)   Resp 16   Ht 5\' 4"  (1.626 m)   Wt 197 lb (89.4 kg)   SpO2 97%   BMI 33.81 kg/m   Physical Exam Constitutional:      Appearance: Normal appearance.  HENT:     Nose: Nose normal.  Cardiovascular:     Rate and Rhythm: Normal rate.     Pulses: Normal pulses.     Heart sounds: Normal heart sounds.  Pulmonary:     Effort: Pulmonary effort is normal.     Breath sounds: Normal breath sounds.  Abdominal:     General: Bowel sounds are normal.  Skin:    General: Skin is warm.  Neurological:     General: No focal deficit present.     Mental Status: She is alert. Mental status is at baseline.  Psychiatric:        Mood and Affect: Mood normal.        Thought Content: Thought content normal.        Judgment: Judgment normal.     Assessment & Plan:   Problem List Items Addressed This Visit      Respiratory   COPD with acute exacerbation ([place]) - Primary   Relevant Orders   Ambulatory referral to Pulmonology    Other Visit Diagnoses    History of seizure disorder       Tobacco  dependence in remission       Depression, unspecified depression type       Relevant Medications   buPROPion (WELLBUTRIN SR) 150 MG 12 hr tablet   Bladder leak       Relevant Orders   Ambulatory referral to Urology   Basic Metabolic Panel (Completed)   GERD without esophagitis       Relevant Medications   omeprazole (PRILOSEC) 20 MG capsule  Diabetes mellitus screening       Anemia of chronic disease       Relevant Orders   CBC (Completed)      Outpatient Encounter Medications as of 06/16/2019  Medication Sig  . omeprazole (PRILOSEC) 20 MG capsule Take 1 capsule (20 mg total) by mouth daily.  . [DISCONTINUED] omeprazole (PRILOSEC) 20 MG capsule Take 20 mg by mouth daily.  Marland Kitchen buPROPion (WELLBUTRIN SR) 150 MG 12 hr tablet Take 1 tablet (150 mg total) by mouth 2 (two) times daily.  . [DISCONTINUED] levETIRAcetam (KEPPRA) 500 MG tablet Take 1 tablet (500 mg total) by mouth 2 (two) times daily. (Patient not taking: Reported on 06/16/2019)  . [DISCONTINUED] levETIRAcetam (KEPPRA) 500 MG tablet Take 1 tablet (500 mg total) by mouth 2 (two) times daily.   No facility-administered encounter medications on file as of 06/16/2019.   COPD with acute exacerbation ([place]) Continue smoking cessation.  Also, supplemental oxygen at 2 L.  Oxygen saturation is 98% at this time. - Ambulatory referral to Pulmonology  History of seizure disorder Continue Keppra 500 mg twice daily.  Also patient warrants a referral to neurology for further work-up and evaluation.  May warrant EEG.  Tobacco dependence in remission Patient has not smoked since hospital discharge.  Smoking cessation instruction/counseling given:  commended patient for quitting and reviewed strategies for preventing relapses  Depression, unspecified depression type Start a trial of bupropion twice daily.  We will follow-up in 1 month to reassess. - buPROPion (WELLBUTRIN SR) 150 MG 12 hr tablet; Take 1 tablet (150 mg total) by mouth 2 (two) times  daily.  Dispense: 60 tablet; Refill: 0  Bladder leak Patient complains of occasional bladder leakage with coughing, sneezing, laughing.  Recommend referral to urology for further work-up and evaluation. - Ambulatory referral to Urology - Basic Metabolic Panel  GERD without esophagiti - omeprazole (PRILOSEC) 20 MG capsule; Take 1 capsule (20 mg total) by mouth daily.  Dispense: 30 capsule; Refill: 6  Anemia of chronic disease - CBC    Follow-up: Return in about 1 month (around 07/14/2019).   Donia Pounds  APRN, MSN, FNP-C [facility] [address] [place], [place] [address] [phone]

## 2019-06-16 NOTE — Progress Notes (Signed)
Pulse ox was 99% with 2L of oxygen stationary, when ambulating 50 feet with 2L Oxygen pulse ox dropped to 95%.  On room air when patient was stationary pulse ox was 95%. When ambulating 50 feet on room air pulse ox dropped to 92%

## 2019-06-17 LAB — BASIC METABOLIC PANEL
BUN/Creatinine Ratio: 20 (ref 12–28)
BUN: 14 mg/dL (ref 8–27)
CO2: 21 mmol/L (ref 20–29)
Calcium: 9.4 mg/dL (ref 8.7–10.3)
Chloride: 102 mmol/L (ref 96–106)
Creatinine, Ser: 0.71 mg/dL (ref 0.57–1.00)
GFR calc Af Amer: 100 mL/min/{1.73_m2} (ref >59)
GFR calc non Af Amer: 87 mL/min/{1.73_m2} (ref >59)
Glucose: 86 mg/dL (ref 65–99)
Potassium: 4.5 mmol/L (ref 3.5–5.2)
Sodium: 141 mmol/L (ref 134–144)

## 2019-06-17 LAB — CBC
Hematocrit: 41.9 % (ref 34.0–46.6)
Hemoglobin: 13.5 g/dL (ref 11.1–15.9)
MCH: 29.6 pg (ref 26.6–33.0)
MCHC: 32.2 g/dL (ref 31.5–35.7)
MCV: 92 fL (ref 79–97)
Platelets: 240 10*3/uL (ref 150–450)
RBC: 4.56 x10E6/uL (ref 3.77–5.28)
RDW: 12.3 % (ref 11.7–15.4)
WBC: 6.2 10*3/uL (ref 3.4–10.8)

## 2019-06-18 ENCOUNTER — Other Ambulatory Visit: Payer: Self-pay

## 2019-06-18 DIAGNOSIS — Z8669 Personal history of other diseases of the nervous system and sense organs: Secondary | ICD-10-CM

## 2019-06-18 MED ORDER — LEVETIRACETAM 500 MG PO TABS: 500.0000 mg | ORAL_TABLET | Freq: Two times a day (BID) | ORAL | 5 refills | Status: DC

## 2019-06-21 DIAGNOSIS — F17201 Nicotine dependence, unspecified, in remission: Secondary | ICD-10-CM | POA: Insufficient documentation

## 2019-06-21 DIAGNOSIS — R32 Unspecified urinary incontinence: Secondary | ICD-10-CM | POA: Insufficient documentation

## 2019-06-21 DIAGNOSIS — K219 Gastro-esophageal reflux disease without esophagitis: Secondary | ICD-10-CM | POA: Insufficient documentation

## 2019-06-21 DIAGNOSIS — F32A Depression, unspecified: Secondary | ICD-10-CM | POA: Insufficient documentation

## 2019-06-21 DIAGNOSIS — Z8669 Personal history of other diseases of the nervous system and sense organs: Secondary | ICD-10-CM | POA: Insufficient documentation

## 2019-06-21 DIAGNOSIS — F329 Major depressive disorder, single episode, unspecified: Secondary | ICD-10-CM | POA: Insufficient documentation

## 2019-06-21 DIAGNOSIS — D638 Anemia in other chronic diseases classified elsewhere: Secondary | ICD-10-CM | POA: Insufficient documentation

## 2019-07-13 ENCOUNTER — Telehealth: Payer: Self-pay | Admitting: Family Medicine

## 2019-07-13 NOTE — Telephone Encounter (Signed)
Pt was called and reminded of there appointment 

## 2019-07-14 ENCOUNTER — Other Ambulatory Visit: Payer: Self-pay

## 2019-07-14 ENCOUNTER — Ambulatory Visit (INDEPENDENT_AMBULATORY_CARE_PROVIDER_SITE_OTHER): Payer: Medicare Other | Admitting: Family Medicine

## 2019-07-14 ENCOUNTER — Encounter: Payer: Self-pay | Admitting: Family Medicine

## 2019-07-14 VITALS — BP 138/64 | HR 60 | Temp 97.7°F | Resp 16 | Ht 64.0 in | Wt 197.0 lb

## 2019-07-14 DIAGNOSIS — F17201 Nicotine dependence, unspecified, in remission: Secondary | ICD-10-CM | POA: Diagnosis not present

## 2019-07-14 DIAGNOSIS — Z9981 Dependence on supplemental oxygen: Secondary | ICD-10-CM | POA: Diagnosis not present

## 2019-07-14 DIAGNOSIS — F329 Major depressive disorder, single episode, unspecified: Secondary | ICD-10-CM

## 2019-07-14 DIAGNOSIS — Z8669 Personal history of other diseases of the nervous system and sense organs: Secondary | ICD-10-CM | POA: Diagnosis not present

## 2019-07-14 DIAGNOSIS — F32A Depression, unspecified: Secondary | ICD-10-CM

## 2019-07-14 MED ORDER — BUPROPION HCL ER (SR) 200 MG PO TB12: 200.0000 mg | ORAL_TABLET | Freq: Two times a day (BID) | ORAL | 5 refills | Status: DC

## 2019-07-14 MED ORDER — LEVETIRACETAM 500 MG PO TABS: 500.0000 mg | ORAL_TABLET | Freq: Two times a day (BID) | ORAL | 5 refills | Status: DC

## 2019-07-14 NOTE — Progress Notes (Signed)
[facility]   Established Patient Office Visit  Subjective:  Patient ID: Eileen Morgan, female    DOB: [birth year]  Age: 69 y.o. MRN: [id number]  CC:  Chief Complaint  Patient presents with  . Depression    1 month follow up     HPI Eileen Morgan, a very pleasant 69 year old female with a medical history significant for COPD, seizure disorder, history of pulmonary embolism, tobacco dependence and on home oxygen presents for follow-up of depression.  1 month prior patient was seen for worsening depression.  She had a third in onset of depression after moving to this area from [place].  Patient was started on Wellbutrin 1 month ago and reports some improvement.   Depression        This is a chronic problem.  The problem occurs daily.  The problem has been gradually improving since onset.  Associated symptoms include decreased concentration, insomnia, decreased interest and sad.  Associated symptoms include no fatigue, no hopelessness, no myalgias, no headaches and no suicidal ideas.     The symptoms are aggravated by family issues (Feelings of isolation).  Risk factors include major life event.   Past medical history includes chronic illness.      Past Medical History:  Diagnosis Date  . COPD (chronic obstructive pulmonary disease) ([place])   . Pulmonary embolism ([place])   . Seizures ([place])     Past Surgical History:  Procedure Laterality Date  . TONSILLECTOMY    . TUBAL LIGATION      Family History  Problem Relation Age of Onset  . CAD Neg Hx   . Diabetes Mellitus II Neg Hx     Social History   Socioeconomic History  . Marital status: Single    Spouse name: Not on file  . Number of children: Not on file  . Years of education: Not on file  . Highest education level: Not on file  Occupational History  . Not on file  Tobacco Use  . Smoking status: Former Research scientist (life sciences)  . Smokeless tobacco: Never Used  Substance and Sexual  Activity  . Alcohol use: Not Currently  . Drug use: Never  . Sexual activity: Not on file  Other Topics Concern  . Not on file  Social History Narrative  . Not on file   Social Determinants of Health   Financial Resource Strain:   . Difficulty of Paying Living Expenses: Not on file  Food Insecurity:   . Worried About Charity fundraiser in the Last Year: Not on file  . Ran Out of Food in the Last Year: Not on file  Transportation Needs:   . Lack of Transportation (Medical): Not on file  . Lack of Transportation (Non-Medical): Not on file  Physical Activity:   . Days of Exercise per Week: Not on file  . Minutes of Exercise per Session: Not on file  Stress:   . Feeling of Stress : Not on file  Social Connections:   . Frequency of Communication with Friends and Family: Not on file  . Frequency of Social Gatherings with Friends and Family: Not on file  . Attends Religious Services: Not on file  . Active Member of Clubs or Organizations: Not on file  . Attends Archivist Meetings: Not on file  . Marital Status: Not on file  Intimate Partner Violence:   . Fear of Current or Ex-Partner: Not on file  . Emotionally Abused:  Not on file  . Physically Abused: Not on file  . Sexually Abused: Not on file    Outpatient Medications Prior to Visit  Medication Sig Dispense Refill  . omeprazole (PRILOSEC) 20 MG capsule Take 1 capsule (20 mg total) by mouth daily. 30 capsule 6  . buPROPion (WELLBUTRIN SR) 150 MG 12 hr tablet Take 1 tablet (150 mg total) by mouth 2 (two) times daily. 60 tablet 0  . levETIRAcetam (KEPPRA) 500 MG tablet Take 1 tablet (500 mg total) by mouth 2 (two) times daily. 60 tablet 5   No facility-administered medications prior to visit.    Allergies  Allergen Reactions  . Penicillins Anaphylaxis    ROS Review of Systems  Constitutional: Negative.  Negative for fatigue.  HENT: Negative.   Eyes: Negative.   Respiratory: Positive for shortness of  breath.   Cardiovascular: Negative.   Gastrointestinal: Negative.   Endocrine: Negative.   Genitourinary: Negative.   Musculoskeletal: Negative.  Negative for myalgias.  Skin: Negative.   Allergic/Immunologic: Negative.   Neurological: Negative.  Negative for headaches.  Hematological: Negative.   Psychiatric/Behavioral: Positive for decreased concentration and depression. Negative for suicidal ideas. The patient has insomnia.       Objective:    Physical Exam  Constitutional: She is oriented to person, place, and time. She appears well-developed and well-nourished.  HENT:  Head: Normocephalic and atraumatic.  Eyes: Pupils are equal, round, and reactive to light.  Cardiovascular: Normal rate and regular rhythm.  Pulmonary/Chest: Effort normal and breath sounds normal.  Abdominal: Soft. Bowel sounds are normal.  Musculoskeletal:        General: Normal range of motion.     Cervical back: Normal range of motion.  Neurological: She is alert and oriented to person, place, and time.  Skin: Skin is warm.  Psychiatric: She has a normal mood and affect. Her behavior is normal. Judgment and thought content normal.    BP (!) 144/89 (BP Location: Left Arm, Patient Position: Sitting, Cuff Size: Normal)   Pulse 60   Temp 97.7 F (36.5 C) (Oral)   Resp 16   Ht 5\' 4"  (1.626 m)   Wt 197 lb (89.4 kg)   SpO2 99%   PF (!) 2 L/min   BMI 33.81 kg/m  Wt Readings from Last 3 Encounters:  07/14/19 197 lb (89.4 kg)  06/16/19 197 lb (89.4 kg)  03/21/19 185 lb 12.8 oz (84.3 kg)     Health Maintenance Due  Topic Date Due  . Hepatitis C Screening  08-15-1950  . TETANUS/TDAP  03/08/1968  . MAMMOGRAM  03/09/1999  . COLONOSCOPY  03/09/1999  . DEXA SCAN  03/08/2014  . PNA vac Low Risk Adult (1 of 2 - PCV13) 03/08/2014  . INFLUENZA VACCINE  12/06/2018    There are no preventive care reminders to display for this patient.  No results found for: TSH Lab Results  Component Value Date    WBC 6.2 06/16/2019   HGB 13.5 06/16/2019   HCT 41.9 06/16/2019   MCV 92 06/16/2019   PLT 240 06/16/2019   Lab Results  Component Value Date   NA 141 06/16/2019   K 4.5 06/16/2019   CO2 21 06/16/2019   GLUCOSE 86 06/16/2019   BUN 14 06/16/2019   CREATININE 0.71 06/16/2019   CALCIUM 9.4 06/16/2019   ANIONGAP 9 03/21/2019   No results found for: CHOL No results found for: HDL No results found for: LDLCALC No results found for: TRIG No results found  for: CHOLHDL No results found for: HGBA1C    Assessment & Plan:   Problem List Items Addressed This Visit      Other   Tobacco dependence in remission   History of seizure disorder - Primary   Relevant Medications   levETIRAcetam (KEPPRA) 500 MG tablet   Depression   Relevant Medications   buPROPion (WELLBUTRIN SR) 200 MG 12 hr tablet    Other Visit Diagnoses    On home oxygen therapy          Meds ordered this encounter  Medications  . buPROPion (WELLBUTRIN SR) 200 MG 12 hr tablet    Sig: Take 1 tablet (200 mg total) by mouth 2 (two) times daily.    Dispense:  60 tablet    Refill:  5    Order Specific Question:   Supervising Provider    Answer:   Tresa Garter [id number]  . levETIRAcetam (KEPPRA) 500 MG tablet    Sig: Take 1 tablet (500 mg total) by mouth 2 (two) times daily.    Dispense:  60 tablet    Refill:  5    Order Specific Question:   Supervising Provider    Answer:   Tresa Garter [id number]    Depression, unspecified depression type Increased Wellbutrin dose to 200 mg twice daily - buPROPion (WELLBUTRIN SR) 200 MG 12 hr tablet; Take 1 tablet (200 mg total) by mouth 2 (two) times daily.  Dispense: 60 tablet; Refill: 5  History of seizure disorder - levETIRAcetam (KEPPRA) 500 MG tablet; Take 1 tablet (500 mg total) by mouth 2 (two) times daily.  Dispense: 60 tablet; Refill: 5  On home oxygen therapy Continue 2 L supplemental oxygen per pulmonology  Tobacco dependence in  remission Patient continues to refrain from smoking.   Follow-up: Return in about 6 months (around 01/14/2020).    Donia Pounds  APRN, MSN, FNP-C [facility] [address] [place], [place] [address] [phone]

## 2019-07-14 NOTE — Patient Instructions (Addendum)

## 2019-08-06 ENCOUNTER — Ambulatory Visit: Payer: Medicare Other

## 2019-10-30 ENCOUNTER — Other Ambulatory Visit: Payer: Self-pay | Admitting: Family Medicine

## 2019-10-30 DIAGNOSIS — F32A Depression, unspecified: Secondary | ICD-10-CM

## 2019-10-30 DIAGNOSIS — K219 Gastro-esophageal reflux disease without esophagitis: Secondary | ICD-10-CM

## 2019-11-17 ENCOUNTER — Ambulatory Visit (INDEPENDENT_AMBULATORY_CARE_PROVIDER_SITE_OTHER): Payer: Medicare Other | Admitting: Family Medicine

## 2019-11-17 ENCOUNTER — Other Ambulatory Visit: Payer: Self-pay

## 2019-11-17 DIAGNOSIS — F17201 Nicotine dependence, unspecified, in remission: Secondary | ICD-10-CM | POA: Diagnosis not present

## 2019-11-17 DIAGNOSIS — M25561 Pain in right knee: Secondary | ICD-10-CM | POA: Diagnosis not present

## 2019-11-17 DIAGNOSIS — J449 Chronic obstructive pulmonary disease, unspecified: Secondary | ICD-10-CM | POA: Diagnosis not present

## 2019-11-17 DIAGNOSIS — Z8669 Personal history of other diseases of the nervous system and sense organs: Secondary | ICD-10-CM

## 2019-11-17 MED ORDER — MELOXICAM 7.5 MG PO TABS: 7.5000 mg | ORAL_TABLET | Freq: Every day | ORAL | 1 refills | Status: DC

## 2019-11-17 NOTE — Progress Notes (Signed)
°  Patient [facility] Patient location: Home Provider location: [place]   Virtual Visit via Telephone Note  I connected with Eileen Morgan on 11/17/19 at  1:00 PM EDT by telephone and verified that I am speaking with the correct person using two identifiers.   I discussed the limitations, risks, security and privacy concerns of performing an evaluation and management service by telephone and the availability of in person appointments. I also discussed with the patient that there may be a patient responsible charge related to this service. The patient expressed understanding and agreed to proceed.   History of Present Illness: Eileen Morgan is a very pleasant 69 year old female with a medical history significant for COPD, history of tobacco dependence, and history of seizure disorder presents via phone with complaints of right knee pain over the past several months.  She states that pain has increased with weightbearing.  She has not identified any provocative factors concerning current knee pain.  She denies any falls and does not have a history of osteoarthritis.  She characterizes pain is intermittent and aching.  She does not have any complaints of difficulty with ambulation.  She has not attempted any over-the-counter interventions for this problem.  She currently denies any headache, chest pain, shortness of breath, urinary symptoms, nausea, vomiting, or diarrhea.  She has been taking all medications consistently.     Past Medical History:  Diagnosis Date   COPD (chronic obstructive pulmonary disease) ([facility])    Pulmonary embolism (HCC)    Seizures ([facility])    Review of Systems  Constitutional: Negative for chills and fever.  HENT: Negative.   Eyes: Negative.   Respiratory: Negative for sputum production and shortness of breath.   Cardiovascular: Negative.   Gastrointestinal: Negative.   Genitourinary: Negative.     Musculoskeletal: Positive for joint pain (Right knee pain).  Skin: Negative.   Neurological: Negative.   Psychiatric/Behavioral: Negative.    Assessment and Plan: Acute pain of right knee - DG Knee Complete 4 Views Right; Future - meloxicam (MOBIC) 7.5 MG tablet; Take 1 tablet (7.5 mg total) by mouth daily.  Dispense: 30 tablet; Refill: 1  Tobacco dependence in remission Smoking cessation instruction/counseling given:  commended patient for quitting and reviewed strategies for preventing relapses  History of seizure disorder No medication changes warranted at this time.   Chronic obstructive pulmonary disease, unspecified COPD type ([facility]) Well controlled on current medication regimen. She is followed by pulmonology. No medication changes warranted at this time.   Follow Up Instructions:    I discussed the assessment and treatment plan with the patient. The patient was provided an opportunity to ask questions and all were answered. The patient agreed with the plan and demonstrated an understanding of the instructions.   The patient was advised to call back or seek an in-person evaluation if the symptoms worsen or if the condition fails to improve as anticipated.  I provided 10 minutes of non-face-to-face time during this encounter.   Donia Pounds  APRN, MSN, FNP-C Patient [facility] [address] [place], [place] [address] [phone]

## 2019-11-29 ENCOUNTER — Encounter: Payer: Self-pay | Admitting: Family Medicine

## 2019-12-22 ENCOUNTER — Ambulatory Visit (HOSPITAL_COMMUNITY)
Admission: RE | Admit: 2019-12-22 | Discharge: 2019-12-22 | Disposition: A | Discharge status: Home / Self Care | Payer: Medicare Other | Source: Ambulatory Visit | Attending: Family Medicine | Admitting: Family Medicine

## 2019-12-22 ENCOUNTER — Other Ambulatory Visit: Payer: Self-pay

## 2019-12-22 DIAGNOSIS — M25561 Pain in right knee: Secondary | ICD-10-CM | POA: Diagnosis present

## 2019-12-29 ENCOUNTER — Telehealth: Payer: Self-pay | Admitting: Family Medicine

## 2019-12-29 DIAGNOSIS — M25561 Pain in right knee: Secondary | ICD-10-CM

## 2019-12-29 DIAGNOSIS — G8929 Other chronic pain: Secondary | ICD-10-CM

## 2019-12-29 NOTE — Telephone Encounter (Signed)
Notified patient to discuss right knee xray results, which are as follows:   FINDINGS: Alignment is anatomic. There is no acute fracture. A joint effusion is present. Mild changes of osteoarthritis in the medial compartment.  IMPRESSION: Mild medial compartment osteoarthritis.  Joint effusion  A referral has been sent to orthopedic services for further workup and evaluation.    Donia Pounds  APRN, MSN, FNP-C Patient [facility] [address] [place], [place] [address] [phone]

## 2019-12-30 ENCOUNTER — Telehealth: Payer: Self-pay | Admitting: Family Medicine

## 2019-12-30 ENCOUNTER — Other Ambulatory Visit: Payer: Self-pay | Admitting: Nurse Practitioner

## 2019-12-30 ENCOUNTER — Encounter: Payer: Self-pay | Admitting: Surgical

## 2019-12-30 ENCOUNTER — Other Ambulatory Visit: Payer: Self-pay | Admitting: Family Medicine

## 2019-12-30 ENCOUNTER — Ambulatory Visit (INDEPENDENT_AMBULATORY_CARE_PROVIDER_SITE_OTHER): Payer: Medicare Other

## 2019-12-30 ENCOUNTER — Ambulatory Visit (INDEPENDENT_AMBULATORY_CARE_PROVIDER_SITE_OTHER): Payer: Medicare Other | Admitting: Surgical

## 2019-12-30 DIAGNOSIS — K219 Gastro-esophageal reflux disease without esophagitis: Secondary | ICD-10-CM

## 2019-12-30 DIAGNOSIS — M25561 Pain in right knee: Secondary | ICD-10-CM

## 2019-12-30 DIAGNOSIS — F32A Depression, unspecified: Secondary | ICD-10-CM

## 2019-12-30 DIAGNOSIS — M1711 Unilateral primary osteoarthritis, right knee: Secondary | ICD-10-CM | POA: Diagnosis not present

## 2019-12-30 MED ORDER — BUPIVACAINE HCL 0.25 % IJ SOLN
4.0000 mL | INTRAMUSCULAR | Status: AC | PRN
  Administered 2019-12-30: 4 mL via INTRA_ARTICULAR

## 2019-12-30 MED ORDER — TRAMADOL HCL 50 MG PO TABS: 50.0000 mg | ORAL_TABLET | Freq: Four times a day (QID) | ORAL | 0 refills | Status: AC | PRN

## 2019-12-30 MED ORDER — LIDOCAINE HCL 1 % IJ SOLN
5.0000 mL | INTRAMUSCULAR | Status: AC | PRN
  Administered 2019-12-30: 5 mL

## 2019-12-30 MED ORDER — MELOXICAM 15 MG PO TABS: 15.0000 mg | ORAL_TABLET | Freq: Every day | ORAL | 1 refills | Status: DC

## 2019-12-30 MED ORDER — METHYLPREDNISOLONE ACETATE 40 MG/ML IJ SUSP
40.0000 mg | INTRAMUSCULAR | Status: AC | PRN
  Administered 2019-12-30: 40 mg via INTRA_ARTICULAR

## 2019-12-30 NOTE — Progress Notes (Unsigned)
   [facility] [address] [place], [place]  [address] Phone:  [phone]   Fax:  [phone] 

## 2019-12-30 NOTE — Progress Notes (Signed)
Office Visit Note   Patient: Eileen Morgan           Date of Birth: 20-Dec-1950           MRN: [id number] Visit Date: 12/30/2019 Requested by: Dorena Dew, FNP [address],  [place] [address] PCP: Dorena Dew, FNP  Subjective: Chief Complaint  Patient presents with  . Right Knee - Pain    HPI: Eileen Morgan is a 69 y.o. female who presents to the office complaining of right knee pain.  Patient notes pain over the last month.  She first noticed when she got out of bed one morning.  No specific injury noted.  She denies any mechanical symptoms.  Knee occasionally gives way on her.  Denies any groin pain, low back pain, radicular pain, numbness/tingling.  Localizes pain to the medial aspect of the knee primarily.  Also pain on the lateral and posterior aspects.  She has tried taking Mobic, Motrin, ice, Tylenol with some relief.  Pivoting specifically makes pain worse.  She has no history of diabetes and no history of blood clots.  She has recently gained 60 pounds since she quit smoking in 2019.  No history of knee injection..                ROS: All systems reviewed are negative as they relate to the chief complaint within the history of present illness.  Patient denies fevers or chills.  Assessment & Plan: Visit Diagnoses:  1. Acute pain of right knee   2. Unilateral primary osteoarthritis, right knee     Plan: Patient is a 69 year old female presents complaining of right knee pain.  Pain has been going on for about 1 month.  No specific injury bringing on onset of pain.  She had radiographs taken about 1 week ago that appear normal.  Weightbearing radiographs were taken today as those last ones were nonweightbearing.  New radiographs show mild to moderate joint space narrowing of the medial compartment.  Impression is osteoarthritis of the right knee.  Continue with Mobic.  Try cortisone injection today.  Patient tolerated the procedure well.  Follow-up as  needed.  Follow-Up Instructions: No follow-ups on file.   Orders:  Orders Placed This Encounter  Procedures  . XR Knee 1-2 Views Right   No orders of the defined types were placed in this encounter.     Procedures: Large Joint Inj: R knee on 12/30/2019 4:51 PM Indications: diagnostic evaluation, joint swelling and pain Details: 18 G 1.5 in needle, superolateral approach  Arthrogram: No  Medications: 5 mL lidocaine 1 %; 40 mg methylPREDNISolone acetate 40 MG/ML; 4 mL bupivacaine 0.25 % Outcome: tolerated well, no immediate complications Procedure, treatment alternatives, risks and benefits explained, specific risks discussed. Consent was given by the patient. Immediately prior to procedure a time out was called to verify the correct patient, procedure, equipment, support staff and site/side marked as required. Patient was prepped and draped in the usual sterile fashion.       Clinical Data: No additional findings.  Objective: Vital Signs: There were no vitals taken for this visit.  Physical Exam:  Constitutional: Patient appears well-developed HEENT:  Head: Normocephalic Eyes:EOM are normal Neck: Normal range of motion Cardiovascular: Normal rate Pulmonary/chest: Effort normal Neurologic: Patient is alert Skin: Skin is warm Psychiatric: Patient has normal mood and affect  Ortho Exam: Ortho exam demonstrates right knee without significant effusion.  Moderate tenderness over the medial joint line.  Mild tenderness over the lateral joint line and the pes anserine bursa.  No ligamentous instability.  Extensor mechanism intact.  0 degrees of extension to greater than 120 degrees of flexion.  No pain with hip range of motion.  Specialty Comments:  No specialty comments available.  Imaging: No results found.   PMFS History: Patient Active Problem List   Diagnosis Date Noted  . History of seizure disorder 06/21/2019  . Tobacco dependence in remission 06/21/2019  .  Depression 06/21/2019  . Bladder leak 06/21/2019  . GERD without esophagitis 06/21/2019  . Anemia of chronic disease 06/21/2019  . COPD with acute exacerbation ([facility]) 03/21/2019  . Acute respiratory failure with hypoxia ([facility]) 03/20/2019  . Seizure ([facility]) 03/20/2019  . CAP (community acquired pneumonia) 03/20/2019   Past Medical History:  Diagnosis Date  . COPD (chronic obstructive pulmonary disease) ([facility])   . Pulmonary embolism ([facility])   . Seizures ([facility])     Family History  Problem Relation Age of Onset  . CAD Neg Hx   . Diabetes Mellitus II Neg Hx     Past Surgical History:  Procedure Laterality Date  . TONSILLECTOMY    . TUBAL LIGATION     Social History   Occupational History  . Not on file  Tobacco Use  . Smoking status: Former Research scientist (life sciences)  . Smokeless tobacco: Never Used  Vaping Use  . Vaping Use: Never used  Substance and Sexual Activity  . Alcohol use: Not Currently  . Drug use: Never  . Sexual activity: Not on file

## 2019-12-30 NOTE — Telephone Encounter (Signed)
  Patient [facility]   Eileen Morgan is a 69 year old female with a medical history significant for right knee osteoarthritis who called complaining of worsening right knee pain.  Patient has not identified any aggravating or alleviating factors concerning right knee pain.  She last had meloxicam 7.5 mg this a.m. without sustained relief.  A recent referral was sent to orthopedic services.  Patient was able to get an appointment for this afternoon at 3:15.  However, patient has extreme pain.  Tramadol 50 mg every 6 hours was sent in for severe pain.  Also, meloxicam was increased to 15 mg daily.  Will defer to orthopedic services for further treatment and evaluation of chronic right knee pain.   Donia Pounds  APRN, MSN, FNP-C Patient [facility] [address] [place], [place] [address] [phone]

## 2019-12-30 NOTE — Progress Notes (Signed)
Meds ordered this encounter  Medications  . traMADol (ULTRAM) 50 MG tablet    Sig: Take 1 tablet (50 mg total) by mouth every 6 (six) hours as needed for up to 5 days.    Dispense:  15 tablet    Refill:  0    Order Specific Question:   Supervising Provider    Answer:   Tresa Garter [id number]  . meloxicam (MOBIC) 15 MG tablet    Sig: Take 1 tablet (15 mg total) by mouth daily.    Dispense:  30 tablet    Refill:  1    Order Specific Question:   Supervising Provider    Answer:   Tresa Garter [[id number]]     Donia Pounds  APRN, MSN, FNP-C Patient [facility] [address] [place], [place] [address] [phone]

## 2020-01-02 ENCOUNTER — Other Ambulatory Visit: Payer: Self-pay

## 2020-01-02 DIAGNOSIS — Z8669 Personal history of other diseases of the nervous system and sense organs: Secondary | ICD-10-CM

## 2020-01-12 ENCOUNTER — Ambulatory Visit: Payer: Medicare Other | Admitting: Family Medicine

## 2020-01-20 ENCOUNTER — Other Ambulatory Visit: Payer: Self-pay | Admitting: Family Medicine

## 2020-01-20 DIAGNOSIS — K219 Gastro-esophageal reflux disease without esophagitis: Secondary | ICD-10-CM

## 2020-01-20 DIAGNOSIS — F32A Depression, unspecified: Secondary | ICD-10-CM

## 2020-01-20 MED ORDER — BUPROPION HCL ER (SR) 200 MG PO TB12: 200.0000 mg | ORAL_TABLET | Freq: Two times a day (BID) | ORAL | 0 refills | Status: DC

## 2020-01-20 MED ORDER — OMEPRAZOLE 20 MG PO CPDR: 20.0000 mg | DELAYED_RELEASE_CAPSULE | Freq: Every day | ORAL | 0 refills | Status: DC

## 2020-01-20 NOTE — Progress Notes (Signed)
Meds ordered this encounter  Medications  . omeprazole (PRILOSEC) 20 MG capsule    Sig: Take 1 capsule (20 mg total) by mouth daily.    Dispense:  30 capsule    Refill:  0    Order Specific Question:   Supervising Provider    Answer:   Tresa Garter [id number]  . buPROPion (WELLBUTRIN SR) 200 MG 12 hr tablet    Sig: Take 1 tablet (200 mg total) by mouth 2 (two) times daily.    Dispense:  30 tablet    Refill:  0    Order Specific Question:   Supervising Provider    Answer:   Tresa Garter [[id number]]     Donia Pounds  APRN, MSN, FNP-C Patient [facility] [address] [place], [place] [address] [phone]

## 2020-01-21 MED ORDER — LEVETIRACETAM 500 MG PO TABS: 500.0000 mg | ORAL_TABLET | Freq: Two times a day (BID) | ORAL | 5 refills | Status: DC

## 2020-01-26 ENCOUNTER — Institutional Professional Consult (permissible substitution): Payer: Medicare Other | Admitting: Pulmonary Disease

## 2020-01-27 NOTE — Progress Notes (Signed)
Called pharmacy in [place], medication hasn't been picked.  Patient had prescription sent to pharmacy in [place]. On 01-26-2020 medication hadn't been picked up

## 2020-02-01 ENCOUNTER — Other Ambulatory Visit: Payer: Self-pay | Admitting: Family Medicine

## 2020-02-01 DIAGNOSIS — F32A Depression, unspecified: Secondary | ICD-10-CM

## 2020-02-01 NOTE — Telephone Encounter (Signed)
Please see refill request.

## 2020-02-12 ENCOUNTER — Other Ambulatory Visit: Payer: Self-pay | Admitting: Family Medicine

## 2020-02-12 DIAGNOSIS — K219 Gastro-esophageal reflux disease without esophagitis: Secondary | ICD-10-CM

## 2020-02-16 ENCOUNTER — Other Ambulatory Visit: Payer: Self-pay

## 2020-02-16 ENCOUNTER — Ambulatory Visit (INDEPENDENT_AMBULATORY_CARE_PROVIDER_SITE_OTHER): Payer: Medicare Other | Admitting: Pulmonary Disease

## 2020-02-16 ENCOUNTER — Encounter: Payer: Self-pay | Admitting: Pulmonary Disease

## 2020-02-16 VITALS — BP 126/68 | HR 98 | Temp 97.8°F | Ht 64.0 in | Wt 197.6 lb

## 2020-02-16 DIAGNOSIS — J441 Chronic obstructive pulmonary disease with (acute) exacerbation: Secondary | ICD-10-CM | POA: Diagnosis not present

## 2020-02-16 NOTE — Patient Instructions (Signed)
We will get some breathing or pulmonary function tests in the coming weeks.  We will check your oxygen as you walk around the office today.   Come back in follow up with Dr. Silas Flood in 6 months.

## 2020-02-17 NOTE — Progress Notes (Signed)
@Patient  ID: Eileen Morgan, female    DOB: [birth year], 69 y.o.   MRN: [id number]  Chief Complaint  Patient presents with  . Consult    referred by PCP for COPD.  CAT score: 10.     Referring provider: Dorena Dew, FNP  HPI:   Eileen Morgan is a 69 year old woman with report history of COPD via spirometry who followed with pulmonary in [place] whom we are seeing in consultation at the request of Cammie Sickle NP for evaluation of COPD.  Notes from referring provider reviewed.  Patient denies any respiratory issue at this time.  Denies any exertional dyspnea or exertional limitation.  She has no cough.  Her cough resolved after he quit smoking some many years ago.  She was present followed by pulmonary doctor in [facility].  Says the diagnosis of COPD was made there.  She was told it was barely COPD.  At one point time she was on maintenance therapies but is no longer.  She denies any significant exertional dyspnea as above.  She says that for several years she got every 33-month CT scans to follow possible scarring on the lung.  She says this never changed or worsened over time.  PMH:  Smoker/ Smoking History:  Maintenance:   Pt of:   02/17/2020  - Visit     Questionaires / Pulmonary Flowsheets:   ACT:  No flowsheet data found.  MMRC: No flowsheet data found.  [facility]worth:  No flowsheet data found.  Tests:   FENO:  No results found for: NITRICOXIDE  PFT: No flowsheet data found.  WALK:  No flowsheet data found.  Imaging: None available, multiple CTs [place], will try to obtain  Lab Results: Personally reviewed CBC    Component Value Date/Time   WBC 6.2 06/16/2019 1150   WBC 5.9 03/21/2019 0356   RBC 4.56 06/16/2019 1150   RBC 4.49 03/21/2019 0356   HGB 13.5 06/16/2019 1150   HCT 41.9 06/16/2019 1150   PLT 240 06/16/2019 1150   MCV 92 06/16/2019 1150   MCH 29.6 06/16/2019 1150   MCH 29.6 03/21/2019 0356   MCHC 32.2 06/16/2019 1150    MCHC 32.0 03/21/2019 0356   RDW 12.3 06/16/2019 1150    BMET    Component Value Date/Time   NA 141 06/16/2019 1150   K 4.5 06/16/2019 1150   CL 102 06/16/2019 1150   CO2 21 06/16/2019 1150   GLUCOSE 86 06/16/2019 1150   GLUCOSE 169 (H) 03/21/2019 0356   BUN 14 06/16/2019 1150   CREATININE 0.71 06/16/2019 1150   CALCIUM 9.4 06/16/2019 1150   GFRNONAA 87 06/16/2019 1150   GFRAA 100 06/16/2019 1150    BNP No results found for: BNP  ProBNP No results found for: PROBNP  Specialty Problems      Pulmonary Problems   Acute respiratory failure with hypoxia (HCC)   CAP (community acquired pneumonia)   COPD with acute exacerbation (HCC)      Allergies  Allergen Reactions  . Penicillins Anaphylaxis     There is no immunization history on file for this patient.  Past Medical History:  Diagnosis Date  . COPD (chronic obstructive pulmonary disease) ([facility])   . Pulmonary embolism ([facility])   . Seizures ([facility])     Tobacco History: Social History   Tobacco Use  Smoking Status Former Smoker  . Packs/day: 1.00  . Years: 40.00  . Pack years: 40.00  . Types: Cigarettes  . Quit date:  03/08/2019  . Years since quitting: 0.9  Smokeless Tobacco Never Used   Counseling given: Not Answered   Outpatient Encounter Medications as of 02/16/2020  Medication Sig  . buPROPion (WELLBUTRIN SR) 200 MG 12 hr tablet TAKE 1 TABLET BY MOUTH TWICE A DAY  . levETIRAcetam (KEPPRA) 500 MG tablet Take 1 tablet (500 mg total) by mouth 2 (two) times daily.  . meloxicam (MOBIC) 15 MG tablet Take 1 tablet (15 mg total) by mouth daily.  Marland Kitchen omeprazole (PRILOSEC) 20 MG capsule TAKE 1 CAPSULE BY MOUTH EVERY DAY  . VENTOLIN HFA 108 (90 Base) MCG/ACT inhaler    No facility-administered encounter medications on file as of 02/16/2020.     Review of Systems  Review of Systems  No extremity edema, no orthopnea or PND.  Comprehensive review of systems otherwise negative.  Physical Exam  BP 126/68 (BP  Location: Left Arm, Cuff Size: Normal)   Pulse 98   Temp 97.8 F (36.6 C) (Temporal)   Ht 5\' 4"  (1.626 m)   Wt 197 lb 9.6 oz (89.6 kg)   SpO2 96%   BMI 33.92 kg/m   Wt Readings from Last 5 Encounters:  02/16/20 197 lb 9.6 oz (89.6 kg)  07/14/19 197 lb (89.4 kg)  06/16/19 197 lb (89.4 kg)  03/21/19 185 lb 12.8 oz (84.3 kg)    BMI Readings from Last 5 Encounters:  02/16/20 33.92 kg/m  07/14/19 33.81 kg/m  06/16/19 33.81 kg/m  03/21/19 31.89 kg/m     Physical Exam General: well appearing, in NAD Eyes: EOMI, no icterus Neck: Supple, no JVP appreciated Respiratory: Distant breath sounds, otherwise clear no wheeze Cardiovascular: Regular, no murmurs Abdomen: Nondistended, bowel sounds present MSK: No synovitis, no effusions Neuro: Normal gait, no weakness Psych: Normal mood, full affect   Assessment & Plan:   COPD: Presumably correct diagnosis - reports spirometry done at prior pulmonary office in [place], [place]. Reportedly very mild but need for oxygen makes me think worse than that. Not on maintenance therapy. Minimal symptoms. Rare albuterol use. No changes at this time. Will obtain PFTs in coming weeks.   Chronic Hypoxemic Respiratory Failure: Presumably due to emphysema, COPD. At rest O2 sats acceptable, did drop to mid 80s without symptoms upon ambulation around office. Continue ambulaotry oxygen.   Return in about 6 months (around 08/16/2020).   Lanier Clam, MD 02/17/2020

## 2020-03-02 ENCOUNTER — Other Ambulatory Visit: Payer: Self-pay | Admitting: Family Medicine

## 2020-03-02 DIAGNOSIS — M25561 Pain in right knee: Secondary | ICD-10-CM

## 2020-03-08 ENCOUNTER — Other Ambulatory Visit: Payer: Self-pay | Admitting: Family Medicine

## 2020-03-08 DIAGNOSIS — K219 Gastro-esophageal reflux disease without esophagitis: Secondary | ICD-10-CM

## 2020-03-08 NOTE — Telephone Encounter (Signed)
Please see RX request.   

## 2020-03-09 ENCOUNTER — Telehealth: Payer: Self-pay | Admitting: Pulmonary Disease

## 2020-03-09 NOTE — Telephone Encounter (Signed)
Called and spoke with patient who states that she tested positive for Covid on 01/26/20. She went to get covid vaccine and was advised she has to wait 90 days before getting them. She states she was supposed to do PFT on 03/16/20 and stated that since she is not vaccinated she has to be covid tested before and was told that she can test positive for up to 3 months so she has canceled that appointment.   Dr. Silas Flood please advise on how long patient should wait to reschedule PFT she is not due back to see you for 6 months

## 2020-03-10 NOTE — Telephone Encounter (Signed)
Called and spoke with patient to let her know Dr. Daryl Eastern recs about PFT. Let her know to call the office back around March-April to schedule PFT and OV. She expressed understanding. Nothing further needed at this time.

## 2020-03-12 ENCOUNTER — Other Ambulatory Visit (HOSPITAL_COMMUNITY): Payer: Medicare Other

## 2020-03-23 ENCOUNTER — Other Ambulatory Visit: Payer: Self-pay | Admitting: Family Medicine

## 2020-03-23 DIAGNOSIS — M25561 Pain in right knee: Secondary | ICD-10-CM

## 2020-03-28 ENCOUNTER — Other Ambulatory Visit: Payer: Self-pay | Admitting: Family Medicine

## 2020-03-28 DIAGNOSIS — F32A Depression, unspecified: Secondary | ICD-10-CM

## 2020-03-28 NOTE — Telephone Encounter (Signed)
Please see patient's medication request

## 2020-03-29 ENCOUNTER — Telehealth: Payer: Self-pay | Admitting: Family Medicine

## 2020-03-29 ENCOUNTER — Other Ambulatory Visit: Payer: Self-pay | Admitting: Family Medicine

## 2020-03-29 DIAGNOSIS — M25561 Pain in right knee: Secondary | ICD-10-CM

## 2020-03-29 MED ORDER — MELOXICAM 15 MG PO TABS: 15.0000 mg | ORAL_TABLET | Freq: Every day | ORAL | 1 refills | Status: DC

## 2020-03-29 NOTE — Progress Notes (Signed)
Meds ordered this encounter  Medications  . meloxicam (MOBIC) 15 MG tablet    Sig: Take 1 tablet (15 mg total) by mouth daily.    Dispense:  30 tablet    Refill:  1    Order Specific Question:   Supervising Provider    Answer:   Tresa Garter [[id number]]    Donia Pounds  APRN, MSN, FNP-C Patient [facility] [address] [place], [place] [address] [phone]

## 2020-03-30 NOTE — Telephone Encounter (Signed)
Done

## 2020-04-09 ENCOUNTER — Other Ambulatory Visit: Payer: Self-pay | Admitting: Family Medicine

## 2020-04-09 DIAGNOSIS — K219 Gastro-esophageal reflux disease without esophagitis: Secondary | ICD-10-CM

## 2020-04-13 NOTE — Telephone Encounter (Signed)
Please see refill request.

## 2020-04-15 ENCOUNTER — Other Ambulatory Visit: Payer: Self-pay | Admitting: Family Medicine

## 2020-04-15 DIAGNOSIS — M25561 Pain in right knee: Secondary | ICD-10-CM

## 2020-04-15 MED ORDER — MELOXICAM 15 MG PO TABS: 15.0000 mg | ORAL_TABLET | Freq: Every day | ORAL | 1 refills | Status: DC

## 2020-05-12 ENCOUNTER — Other Ambulatory Visit: Payer: Self-pay | Admitting: Family Medicine

## 2020-05-12 DIAGNOSIS — K219 Gastro-esophageal reflux disease without esophagitis: Secondary | ICD-10-CM

## 2020-05-17 ENCOUNTER — Telehealth (INDEPENDENT_AMBULATORY_CARE_PROVIDER_SITE_OTHER): Payer: Medicare Other | Admitting: Family Medicine

## 2020-05-17 DIAGNOSIS — K219 Gastro-esophageal reflux disease without esophagitis: Secondary | ICD-10-CM

## 2020-05-17 DIAGNOSIS — G8929 Other chronic pain: Secondary | ICD-10-CM | POA: Diagnosis not present

## 2020-05-17 DIAGNOSIS — J449 Chronic obstructive pulmonary disease, unspecified: Secondary | ICD-10-CM | POA: Diagnosis not present

## 2020-05-17 DIAGNOSIS — Z8669 Personal history of other diseases of the nervous system and sense organs: Secondary | ICD-10-CM | POA: Diagnosis not present

## 2020-05-17 DIAGNOSIS — F32A Depression, unspecified: Secondary | ICD-10-CM | POA: Diagnosis not present

## 2020-05-17 DIAGNOSIS — M25561 Pain in right knee: Secondary | ICD-10-CM

## 2020-05-17 MED ORDER — MELOXICAM 15 MG PO TABS: 15.0000 mg | ORAL_TABLET | Freq: Every day | ORAL | 5 refills | Status: DC

## 2020-05-17 MED ORDER — OMEPRAZOLE 20 MG PO CPDR: DELAYED_RELEASE_CAPSULE | ORAL | 5 refills | Status: DC

## 2020-05-17 MED ORDER — LEVETIRACETAM 500 MG PO TABS
500.0000 mg | ORAL_TABLET | Freq: Two times a day (BID) | ORAL | 5 refills | Status: DC
Start: 2020-05-17 — End: 2020-06-21

## 2020-05-17 NOTE — Progress Notes (Signed)
Virtual Visit via Telephone Note  I connected with Sharyl Panchal on 05/17/20 at  1:20 PM EST by telephone and verified that I am speaking with the correct person using two identifiers.  Location: Patient: Home Provider: [facility]   I discussed the limitations, risks, security and privacy concerns of performing an evaluation and management service by telephone and the availability of in person appointments. I also discussed with the patient that there may be a patient responsible charge related to this service. The patient expressed understanding and agreed to proceed.   History of Present Illness:  Aiza Vollrath is a 70 year old female with a medical history significant for COPD, history of pulmonary embolism, chronic knee pain, and history of seizure disorder presents for follow-up of chronic conditions.  Patient says that she has been doing well and is without complaint today.  She has been taking all medication consistently.  She is following up with pulmonology consistently for COPD.  She has not had any exacerbations over the past year. Also, patient has a history of seizure disorder.  She takes Keppra consistently.  No seizure activity noted.  She denies any headache, dizziness, paresthesias, urinary symptoms, nausea, vomiting, or diarrhea.   Past Medical History:  Diagnosis Date  . COPD (chronic obstructive pulmonary disease) ([facility])   . Pulmonary embolism ([facility])   . Seizures ([facility])    Social History   Socioeconomic History  . Marital status: Single    Spouse name: Not on file  . Number of children: Not on file  . Years of education: Not on file  . Highest education level: Not on file  Occupational History  . Not on file  Tobacco Use  . Smoking status: Former Smoker    Packs/day: 1.00    Years: 40.00    Pack years: 40.00    Types: Cigarettes    Quit date: 03/08/2019    Years since quitting: 1.1  . Smokeless tobacco: Never Used  Vaping Use  . Vaping Use:  Never used  Substance and Sexual Activity  . Alcohol use: Not Currently  . Drug use: Never  . Sexual activity: Not on file  Other Topics Concern  . Not on file  Social History Narrative  . Not on file   Social Determinants of Health   Financial Resource Strain: Not on file  Food Insecurity: Not on file  Transportation Needs: Not on file  Physical Activity: Not on file  Stress: Not on file  Social Connections: Not on file  Intimate Partner Violence: Not on file    There is no immunization history on file for this patient. Review of Systems  Constitutional: Negative for chills and fever.  HENT: Negative.   Eyes: Negative.   Respiratory: Negative.   Gastrointestinal: Negative.   Genitourinary: Negative.   Musculoskeletal: Positive for joint pain.  Skin: Negative.   Neurological: Negative.   Psychiatric/Behavioral: Negative.        Assessment and Plan: 1. Chronic obstructive pulmonary disease, unspecified COPD type ([facility]) Recommend the patient follows up with pulmonology as scheduled.  2. Chronic pain of right knee Continue meloxicam, no changes in dosage on today.  3. Depression, unspecified depression type Patient denies any suicidal homicidal ideations.  Continue Wellbutrin as previously prescribed.  4. History of seizure disorder  - levETIRAcetam (KEPPRA) 500 MG tablet; Take 1 tablet (500 mg total) by mouth 2 (two) times daily.  Dispense: 60 tablet; Refill: 5  5. GERD without esophagitis  - omeprazole (PRILOSEC)  20 MG capsule; TAKE 1 CAPSULE BY MOUTH EVERY DAY  Dispense: 30 capsule; Refill: 5  6. Acute pain of right knee  - meloxicam (MOBIC) 15 MG tablet; Take 1 tablet (15 mg total) by mouth daily.  Dispense: 30 tablet; Refill: 5  Follow Up Instructions:    I discussed the assessment and treatment plan with the patient. The patient was provided an opportunity to ask questions and all were answered. The patient agreed with the plan and demonstrated an  understanding of the instructions.   The patient was advised to call back or seek an in-person evaluation if the symptoms worsen or if the condition fails to improve as anticipated.  I provided 10 minutes of non-face-to-face time during this encounter.   Donia Pounds  APRN, MSN, FNP-C Patient [facility] [address] [place], [place] [address] [phone]

## 2020-06-21 ENCOUNTER — Other Ambulatory Visit: Payer: Self-pay

## 2020-06-21 ENCOUNTER — Ambulatory Visit (INDEPENDENT_AMBULATORY_CARE_PROVIDER_SITE_OTHER): Payer: Medicare Other | Admitting: Family Medicine

## 2020-06-21 ENCOUNTER — Encounter: Payer: Self-pay | Admitting: Family Medicine

## 2020-06-21 VITALS — BP 153/72 | HR 75 | Temp 97.5°F | Ht 64.0 in | Wt 201.0 lb

## 2020-06-21 DIAGNOSIS — Z1231 Encounter for screening mammogram for malignant neoplasm of breast: Secondary | ICD-10-CM | POA: Diagnosis not present

## 2020-06-21 DIAGNOSIS — Z23 Encounter for immunization: Secondary | ICD-10-CM | POA: Diagnosis not present

## 2020-06-21 DIAGNOSIS — Z8669 Personal history of other diseases of the nervous system and sense organs: Secondary | ICD-10-CM

## 2020-06-21 DIAGNOSIS — N3281 Overactive bladder: Secondary | ICD-10-CM | POA: Diagnosis not present

## 2020-06-21 DIAGNOSIS — R030 Elevated blood-pressure reading, without diagnosis of hypertension: Secondary | ICD-10-CM

## 2020-06-21 DIAGNOSIS — Z1211 Encounter for screening for malignant neoplasm of colon: Secondary | ICD-10-CM

## 2020-06-21 DIAGNOSIS — R399 Unspecified symptoms and signs involving the genitourinary system: Secondary | ICD-10-CM

## 2020-06-21 DIAGNOSIS — N3289 Other specified disorders of bladder: Secondary | ICD-10-CM

## 2020-06-21 DIAGNOSIS — J449 Chronic obstructive pulmonary disease, unspecified: Secondary | ICD-10-CM

## 2020-06-21 MED ORDER — LEVETIRACETAM 500 MG PO TABS
500.0000 mg | ORAL_TABLET | Freq: Two times a day (BID) | ORAL | 5 refills | Status: DC
Start: 2020-06-21 — End: 2021-01-06

## 2020-06-21 MED ORDER — OXYBUTYNIN CHLORIDE ER 10 MG PO TB24
10.0000 mg | ORAL_TABLET | Freq: Every day | ORAL | 1 refills | Status: DC
Start: 2020-06-21 — End: 2020-12-09

## 2020-06-21 NOTE — Patient Instructions (Addendum)
We will follow-up by phone with urine culture results. Start a trial of oxybutynin 10 mg XL daily for overactive bladder.  We will continue all other medications as previously prescribed.  When you return in 6 months, let us do a complete physical exam.    Oxybutynin extended-release tablets What is this medicine? OXYBUTYNIN (ox i BYOO ti nin) is used to treat overactive bladder. This medicine reduces the amount of bathroom visits. It may also help to control wetting accidents. This medicine may be used for other purposes; ask your health care provider or pharmacist if you have questions. COMMON BRAND NAME(S): Ditropan XL What should I tell my health care provider before I take this medicine? They need to know if you have any of these conditions:  autonomic neuropathy  dementia  difficulty passing urine  glaucoma  intestinal obstruction  kidney disease  liver disease  myasthenia gravis  Parkinson's disease  an unusual or allergic reaction to oxybutynin, other medicines, foods, dyes, or preservatives  pregnant or trying to get pregnant  breast-feeding How should I use this medicine? Take this medicine by mouth with a glass of water. Swallow whole, do not crush, cut, or chew. Follow the directions on the prescription label. You can take this medicine with or without food. Take your doses at regular intervals. Do not take your medicine more often than directed. Talk to your pediatrician regarding the use of this medicine in children. Special care may be needed. While this drug may be prescribed for children as young as 6 years for selected conditions, precautions do apply. Overdosage: If you think you have taken too much of this medicine contact a poison control center or emergency room at once. NOTE: This medicine is only for you. Do not share this medicine with others. What if I miss a dose? If you miss a dose, take it as soon as you can. If it is almost time for your next  dose, take only that dose. Do not take double or extra doses. What may interact with this medicine?  antihistamines for allergy, cough and cold  atropine  certain medicines for bladder problems like oxybutynin, tolterodine  certain medicines for Parkinson's disease like benztropine, trihexyphenidyl  certain medicines for stomach problems like dicyclomine, hyoscyamine  certain medicines for travel sickness like scopolamine  clarithromycin  erythromycin  ipratropium  medicines for fungal infections, like fluconazole, itraconazole, ketoconazole or voriconazole This list may not describe all possible interactions. Give your health care provider a list of all the medicines, herbs, non-prescription drugs, or dietary supplements you use. Also tell them if you smoke, drink alcohol, or use illegal drugs. Some items may interact with your medicine. What should I watch for while using this medicine? It may take a few weeks to notice the full benefit from this medicine. You may need to limit your intake tea, coffee, caffeinated sodas, and alcohol. These drinks may make your symptoms worse. You may get drowsy or dizzy. Do not drive, use machinery, or do anything that needs mental alertness until you know how this medicine affects you. Do not stand or sit up quickly, especially if you are an older patient. This reduces the risk of dizzy or fainting spells. Alcohol may interfere with the effect of this medicine. Avoid alcoholic drinks. Your mouth may get dry. Chewing sugarless gum or sucking hard candy, and drinking plenty of water may help. Contact your doctor if the problem does not go away or is severe. This medicine may cause dry  eyes and blurred vision. If you wear contact lenses, you may feel some discomfort. Lubricating drops may help. See your eyecare professional if the problem does not go away or is severe. You may notice the shells of the tablets in your stool from time to time. This is  normal. Avoid extreme heat. This medicine can cause you to sweat less than normal. Your body temperature could increase to dangerous levels, which may lead to heat stroke. What side effects may I notice from receiving this medicine? Side effects that you should report to your doctor or health care professional as soon as possible:  allergic reactions like skin rash, itching or hives, swelling of the face, lips, or tongue  agitation  breathing problems  confusion  fever  flushing (reddening of the skin)  hallucinations  memory loss  pain or difficulty passing urine  palpitations  unusually weak or tired Side effects that usually do not require medical attention (report to your doctor or health care professional if they continue or are bothersome):  constipation  headache  sexual difficulties (impotence) This list may not describe all possible side effects. Call your doctor for medical advice about side effects. You may report side effects to FDA at 1-800-FDA-1088. Where should I keep my medicine? Keep out of the reach of children. Store at room temperature between 15 and 30 degrees C (59 and 86 degrees F). Protect from moisture and humidity. Throw away any unused medicine after the expiration date. NOTE: This sheet is a summary. It may not cover all possible information. If you have questions about this medicine, talk to your doctor, pharmacist, or health care provider.  2021 Elsevier/Gold Standard (2013-07-09 10:57:06) Kegel Exercises  Kegel exercises can help strengthen your pelvic floor muscles. The pelvic floor is a group of muscles that support your rectum, small intestine, and bladder. In females, pelvic floor muscles also help support the womb (uterus). These muscles help you control the flow of urine and stool. Kegel exercises are painless and simple, and they do not require any equipment. Your provider may suggest Kegel exercises to:  Improve bladder and bowel  control.  Improve sexual response.  Improve weak pelvic floor muscles after surgery to remove the uterus (hysterectomy) or pregnancy (females).  Improve weak pelvic floor muscles after prostate gland removal or surgery (males). Kegel exercises involve squeezing your pelvic floor muscles, which are the same muscles you squeeze when you try to stop the flow of urine or keep from passing gas. The exercises can be done while sitting, standing, or lying down, but it is best to vary your position. Exercises How to do Kegel exercises: 1. Squeeze your pelvic floor muscles tight. You should feel a tight lift in your rectal area. If you are a female, you should also feel a tightness in your vaginal area. Keep your stomach, buttocks, and legs relaxed. 2. Hold the muscles tight for up to 10 seconds. 3. Breathe normally. 4. Relax your muscles. 5. Repeat as told by your health care provider. Repeat this exercise daily as told by your health care provider. Continue to do this exercise for at least 4-6 weeks, or for as long as told by your health care provider. You may be referred to a physical therapist who can help you learn more about how to do Kegel exercises. Depending on your condition, your health care provider may recommend:  Varying how long you squeeze your muscles.  Doing several sets of exercises every day.  Doing exercises for  several weeks.  Making Kegel exercises a part of your regular exercise routine. This information is not intended to replace advice given to you by your health care provider. Make sure you discuss any questions you have with your health care provider. Document Revised: 08/28/2019 Document Reviewed: 12/11/2017 Elsevier Patient Education  [place].

## 2020-06-21 NOTE — Progress Notes (Signed)
Patient [facility]   Established Patient Office Visit  Subjective:  Patient ID: Eileen Morgan, female    DOB: June 02, 1950  Age: 70 y.o. MRN: [id number]  CC:  Chief Complaint  Patient presents with  . Follow-up    Follow up     HPI Eileen Morgan is a 70 year old female with a medical history significant for seizure disorder, depression, chronic knee pain, and COPD presents for a follow-up of chronic conditions.  Also, patient has a complaint of urinary urgency, decreased bladder emptying, bladder spasms, and dysuria.  Patient states that she had a urinary tract infection greater than a month ago and was treated at urgent care.  She states that she continues to have symptoms that are consistent with previous urinary tract infection.  She states that she completed antibiotic.  She denies any blood in urine.  Patient also denies abdominal pain or vaginal bleeding at this time.  She also denies low back pain. Patient has been taking all chronic medications without interruption.  She has a history of seizure disorder, but has not had any seizure activity in greater than 1 year.  Patient has a history of COPD and is followed by pulmonology.  She has not had any recent COPD exacerbations. As far as health maintenance, patient has been fully vaccinated against COVID-19.  She has not received booster as of yet.  She is overdue for pneumococcal 13 vaccination.  Patient is also overdue for routine screening colonoscopy.  She states that she has not had a mammogram and greater than 2 years and previous mammogram was unremarkable.  Patient has no family history of breast cancer that she can recall.   Past Medical History:  Diagnosis Date  . COPD (chronic obstructive pulmonary disease) ([place])   . Pulmonary embolism ([place])   . Seizures ([place])     Past Surgical History:  Procedure Laterality Date  . TONSILLECTOMY    . TUBAL LIGATION      Family History  Problem  Relation Age of Onset  . COPD Father   . Emphysema Brother   . CAD Neg Hx   . Diabetes Mellitus II Neg Hx     Social History   Socioeconomic History  . Marital status: Single    Spouse name: Not on file  . Number of children: Not on file  . Years of education: Not on file  . Highest education level: Not on file  Occupational History  . Not on file  Tobacco Use  . Smoking status: Former Smoker    Packs/day: 1.00    Years: 40.00    Pack years: 40.00    Types: Cigarettes    Quit date: 03/08/2019    Years since quitting: 1.2  . Smokeless tobacco: Never Used  Vaping Use  . Vaping Use: Never used  Substance and Sexual Activity  . Alcohol use: Not Currently  . Drug use: Never  . Sexual activity: Not on file  Other Topics Concern  . Not on file  Social History Narrative  . Not on file   Social Determinants of Health   Financial Resource Strain: Not on file  Food Insecurity: Not on file  Transportation Needs: Not on file  Physical Activity: Not on file  Stress: Not on file  Social Connections: Not on file  Intimate Partner Violence: Not on file    Outpatient Medications Prior to Visit  Medication Sig Dispense Refill  . buPROPion (WELLBUTRIN SR) 200  MG 12 hr tablet TAKE 1 TABLET BY MOUTH TWICE A DAY 180 tablet 2  . levETIRAcetam (KEPPRA) 500 MG tablet Take 1 tablet (500 mg total) by mouth 2 (two) times daily. 60 tablet 5  . meloxicam (MOBIC) 15 MG tablet Take 1 tablet (15 mg total) by mouth daily. 30 tablet 5  . omeprazole (PRILOSEC) 20 MG capsule TAKE 1 CAPSULE BY MOUTH EVERY DAY 30 capsule 5  . OXYBUTYNIN CHLORIDE PO Take by mouth.    . VENTOLIN HFA 108 (90 Base) MCG/ACT inhaler      No facility-administered medications prior to visit.    Allergies  Allergen Reactions  . Penicillins Anaphylaxis    ROS Review of Systems  Constitutional: Negative for chills and diaphoresis.  HENT: Negative.   Eyes: Negative.   Respiratory: Negative.   Cardiovascular:  Negative.   Gastrointestinal: Negative.   Endocrine: Negative for polydipsia, polyphagia and polyuria.  Genitourinary: Positive for dysuria and urgency. Negative for decreased urine volume, frequency and hematuria.  Musculoskeletal: Positive for arthralgias.  Neurological: Negative.   Hematological: Negative.   Psychiatric/Behavioral: Negative.       Objective:    Physical Exam Constitutional:      Appearance: She is obese.  HENT:     Nose: Nose normal.     Mouth/Throat:     Mouth: Mucous membranes are moist.  Eyes:     Pupils: Pupils are equal, round, and reactive to light.  Cardiovascular:     Rate and Rhythm: Normal rate and regular rhythm.     Pulses: Normal pulses.  Pulmonary:     Effort: Pulmonary effort is normal.  Abdominal:     General: Bowel sounds are normal.  Musculoskeletal:        General: Normal range of motion.  Skin:    General: Skin is warm.  Neurological:     General: No focal deficit present.     Mental Status: Mental status is at baseline.  Psychiatric:        Mood and Affect: Mood normal.        Thought Content: Thought content normal.        Judgment: Judgment normal.     BP (!) 153/72   Pulse 75   Temp (!) 97.5 F (36.4 C) (Temporal)   Ht 5\' 4"  (1.626 m)   Wt 201 lb (91.2 kg)   SpO2 95%   BMI 34.50 kg/m  Wt Readings from Last 3 Encounters:  06/21/20 201 lb (91.2 kg)  02/16/20 197 lb 9.6 oz (89.6 kg)  07/14/19 197 lb (89.4 kg)     Health Maintenance Due  Topic Date Due  . Hepatitis C Screening  Never done  . COVID-19 Vaccine (1) Never done  . TETANUS/TDAP  Never done  . COLONOSCOPY (Pts 45-59yrs Insurance coverage will need to be confirmed)  Never done  . MAMMOGRAM  Never done  . DEXA SCAN  Never done  . PNA vac Low Risk Adult (1 of 2 - PCV13) Never done  . INFLUENZA VACCINE  Never done    There are no preventive care reminders to display for this patient.  No results found for: TSH Lab Results  Component Value Date    WBC 6.2 06/16/2019   HGB 13.5 06/16/2019   HCT 41.9 06/16/2019   MCV 92 06/16/2019   PLT 240 06/16/2019   Lab Results  Component Value Date   NA 141 06/16/2019   K 4.5 06/16/2019   CO2 21 06/16/2019  GLUCOSE 86 06/16/2019   BUN 14 06/16/2019   CREATININE 0.71 06/16/2019   CALCIUM 9.4 06/16/2019   ANIONGAP 9 03/21/2019   No results found for: CHOL No results found for: HDL No results found for: LDLCALC No results found for: TRIG No results found for: CHOLHDL No results found for: HGBA1C    Assessment & Plan:   Problem List Items Addressed This Visit      Other   History of seizure disorder   Relevant Medications   levETIRAcetam (KEPPRA) 500 MG tablet    Other Visit Diagnoses    Urinary tract infection symptoms    -  Primary   Relevant Orders   Urinalysis Dipstick   Urine Culture (Completed)   OAB (overactive bladder)       Relevant Medications   oxybutynin (DITROPAN-XL) 10 MG 24 hr tablet   Bladder spasms       Relevant Medications   oxybutynin (DITROPAN-XL) 10 MG 24 hr tablet   Chronic obstructive pulmonary disease, unspecified COPD type ([place])       Immunization due       Relevant Orders   Pneumococcal conjugate vaccine 13-valent (Completed)   Breast cancer screening by mammogram       Relevant Orders   MM Digital Screening   Colon cancer screening       Relevant Orders   Ambulatory referral to Gastroenterology   Elevated blood-pressure reading without diagnosis of hypertension        1. Urinary tract infection symptoms - Urinalysis Dipstick - Urine Culture  2. OAB (overactive bladder) - oxybutynin (DITROPAN-XL) 10 MG 24 hr tablet; Take 1 tablet (10 mg total) by mouth at bedtime.  Dispense: 30 tablet; Refill: 1  3. Bladder spasms - oxybutynin (DITROPAN-XL) 10 MG 24 hr tablet; Take 1 tablet (10 mg total) by mouth at bedtime.  Dispense: 30 tablet; Refill: 1  4. Chronic obstructive pulmonary disease, unspecified COPD type [facility]) Follow-up with  pulmonologist as scheduled.  5. History of seizure disorder No seizure activity in greater than 1 year.  No medication changes warranted at this time. - levETIRAcetam (KEPPRA) 500 MG tablet; Take 1 tablet (500 mg total) by mouth 2 (two) times daily.  Dispense: 60 tablet; Refill: 5  6. Immunization due - Pneumococcal conjugate vaccine 13-valent  7. Breast cancer screening by mammogram - MM Digital Screening; Future  8. Colon cancer screening Patient's previous colonoscopy was in [place]. - Ambulatory referral to Gastroenterology  9. Elevated blood-pressure reading without diagnosis of hypertension Patient will return to clinic in 1 week for blood pressure check.  No medications will be initiated at this time.   Follow-up: Return in about 6 months (around 12/19/2020).    Donia Pounds  APRN, MSN, FNP-C Patient [facility] [address] [place], [place] [address] [phone]

## 2020-06-23 LAB — URINE CULTURE

## 2020-06-28 ENCOUNTER — Other Ambulatory Visit: Payer: Self-pay

## 2020-06-28 ENCOUNTER — Ambulatory Visit: Payer: Medicare Other

## 2020-06-28 VITALS — BP 137/80 | HR 66 | Temp 97.7°F | Ht 64.0 in | Wt 201.0 lb

## 2020-06-28 DIAGNOSIS — R030 Elevated blood-pressure reading, without diagnosis of hypertension: Secondary | ICD-10-CM

## 2020-06-28 NOTE — Progress Notes (Unsigned)
Pt is here today for blood pressure check only, pt was a week ago and was told to follow up  Regarding blood pressure. Rt arm 146/76 pluse 62 and  Lt 149/78. Pt said that she has been check it at home however not sure if monitor is working correctly . I told her she can bring by the office and we can compare it to our to see if running in the same range.    Wait 15 min. Before I recheck patient b/p again.   Recheck b/p after 15 min  137/80 pluse 63

## 2020-09-27 ENCOUNTER — Telehealth: Payer: Self-pay | Admitting: *Deleted

## 2020-09-27 NOTE — Telephone Encounter (Signed)
Spoke with pt- she states she wears oxygen at this time.  She has been wearing this for "quite a while now, since my seizure."  I explained need for OV and to have procedure done at [facility].  Understanding voiced.  PV and colonoscopy at [facility] cancelled.  OV made with Alonza Bogus, new patient appt, on 10-21-20 at 11:30 am

## 2020-10-18 ENCOUNTER — Encounter: Payer: Self-pay | Admitting: Nurse Practitioner

## 2020-10-21 ENCOUNTER — Encounter: Payer: Medicare Other | Admitting: Gastroenterology

## 2020-10-21 ENCOUNTER — Ambulatory Visit: Payer: Medicare Other | Admitting: Gastroenterology

## 2020-11-03 ENCOUNTER — Ambulatory Visit: Payer: Medicare Other

## 2020-11-09 ENCOUNTER — Other Ambulatory Visit: Payer: Self-pay | Admitting: Family Medicine

## 2020-11-09 DIAGNOSIS — K219 Gastro-esophageal reflux disease without esophagitis: Secondary | ICD-10-CM

## 2020-11-17 ENCOUNTER — Other Ambulatory Visit: Payer: Self-pay | Admitting: Family Medicine

## 2020-11-17 ENCOUNTER — Ambulatory Visit (INDEPENDENT_AMBULATORY_CARE_PROVIDER_SITE_OTHER): Payer: Medicare Other | Admitting: Nurse Practitioner

## 2020-11-17 ENCOUNTER — Encounter: Payer: Self-pay | Admitting: Nurse Practitioner

## 2020-11-17 VITALS — BP 158/72 | HR 62 | Ht 64.0 in | Wt 202.0 lb

## 2020-11-17 DIAGNOSIS — K219 Gastro-esophageal reflux disease without esophagitis: Secondary | ICD-10-CM

## 2020-11-17 DIAGNOSIS — Z1211 Encounter for screening for malignant neoplasm of colon: Secondary | ICD-10-CM | POA: Diagnosis not present

## 2020-11-17 DIAGNOSIS — F32A Depression, unspecified: Secondary | ICD-10-CM

## 2020-11-17 MED ORDER — BUPROPION HCL ER (SR) 200 MG PO TB12: 200.0000 mg | ORAL_TABLET | Freq: Two times a day (BID) | ORAL | 2 refills | Status: DC

## 2020-11-17 MED ORDER — SUPREP BOWEL PREP KIT 17.5-3.13-1.6 GM/177ML PO SOLN: 1.0000 | ORAL | 0 refills | Status: DC

## 2020-11-17 NOTE — Patient Instructions (Addendum)
If you are age 72 or older, your body mass index should be between 23-30. Your Body mass index is 34.67 kg/m. If this is out of the aforementioned range listed, please consider follow up with your Primary Care Provider.  The [facility] providers would like to encourage you to use [facility] to communicate with providers for non-urgent requests or questions.  Due to long hold times on the telephone, sending your provider a message by [facility] may be faster and more efficient way to get a response. Please allow 48 business hours for a response.  Please remember that this is for non-urgent requests/questions.  PROCEDURES: You have been scheduled for a colonoscopy at [facility] on 01/12/21, arrival time 6:00am. Please follow the written instructions given to you at your visit today. Please pick up your prep supplies at the pharmacy within the next 1-3 days. If you use inhalers (even only as needed), please bring them with you on the day of your procedure.  Please call us if left lower abdominal pain recurs. Please follow GERD information. It was great seeing you today! Thank you for entrusting me with your care and choosing [facility].  Noralyn Pick, CRNP  Gastroesophageal Reflux Disease, Adult  Gastroesophageal reflux (GER) happens when acid from the stomach flows up into the tube that connects the mouth and the stomach (esophagus). Normally, food travels down the esophagus and stays in the stomach to be digested. With GER, food and stomach acid sometimes move back up into theesophagus. You may have a disease called gastroesophageal reflux disease (GERD) if the reflux: Happens often. Causes frequent or very bad symptoms. Causes problems such as damage to the esophagus. When this happens, the esophagus becomes sore and swollen. Over time, GERD can make small holes (ulcers) in the lining of the esophagus. What are the causes? This condition is caused by a problem with  the muscle between the esophagus and the stomach. When this muscle is weak or not normal, it does not close properlyto keep food and acid from coming back up from the stomach. The muscle can be weak because of: Tobacco use. Pregnancy. Having a certain type of hernia (hiatal hernia). Alcohol use. Certain foods and drinks, such as coffee, chocolate, onions, and peppermint. What increases the risk? Being overweight. Having a disease that affects your connective tissue. Taking NSAIDs, such a ibuprofen. What are the signs or symptoms? Heartburn. Difficult or painful swallowing. The feeling of having a lump in the throat. A bitter taste in the mouth. Bad breath. Having a lot of saliva. Having an upset or bloated stomach. Burping. Chest pain. Different conditions can cause chest pain. Make sure you see your doctor if you have chest pain. Shortness of breath or wheezing. A long-term cough or a cough at night. Wearing away of the surface of teeth (tooth enamel). Weight loss. How is this treated? Making changes to your diet. Taking medicine. Having surgery. Treatment will depend on how bad your symptoms are. Follow these instructions at home: Eating and drinking  Follow a diet as told by your doctor. You may need to avoid foods and drinks such as: Coffee and tea, with or without caffeine. Drinks that contain alcohol. Energy drinks and sports drinks. Bubbly (carbonated) drinks or sodas. Chocolate and cocoa. Peppermint and mint flavorings. Garlic and onions. Horseradish. Spicy and acidic foods. These include peppers, chili powder, curry powder, vinegar, hot sauces, and BBQ sauce. Citrus fruit juices and citrus fruits, such as oranges, lemons, and  limes. Tomato-based foods. These include red sauce, chili, salsa, and pizza with red sauce. Fried and fatty foods. These include donuts, french fries, potato chips, and high-fat dressings. High-fat meats. These include hot dogs, rib eye  steak, sausage, ham, and bacon. High-fat dairy items, such as whole milk, butter, and cream cheese. Eat small meals often. Avoid eating large meals. Avoid drinking large amounts of liquid with your meals. Avoid eating meals during the 2-3 hours before bedtime. Avoid lying down right after you eat. Do not exercise right after you eat.  Lifestyle  Do not smoke or use any products that contain nicotine or tobacco. If you need help quitting, ask your doctor. Try to lower your stress. If you need help doing this, ask your doctor. If you are overweight, lose an amount of weight that is healthy for you. Ask your doctor about a safe weight loss goal.  General instructions Pay attention to any changes in your symptoms. Take over-the-counter and prescription medicines only as told by your doctor. Do not take aspirin, ibuprofen, or other NSAIDs unless your doctor says it is okay. Wear loose clothes. Do not wear anything tight around your waist. Raise (elevate) the head of your bed about 6 inches (15 cm). You may need to use a wedge to do this. Avoid bending over if this makes your symptoms worse. Keep all follow-up visits. Contact a doctor if: You have new symptoms. You lose weight and you do not know why. You have trouble swallowing or it hurts to swallow. You have wheezing or a cough that keeps happening. You have a hoarse voice. Your symptoms do not get better with treatment. Get help right away if: You have sudden pain in your arms, neck, jaw, teeth, or back. You suddenly feel sweaty, dizzy, or light-headed. You have chest pain or shortness of breath. You vomit and the vomit is green, yellow, or black, or it looks like blood or coffee grounds. You faint. Your poop (stool) is red, bloody, or black. You cannot swallow, drink, or eat. These symptoms may represent a serious problem that is an emergency. Do not wait to see if the symptoms will go away. Get medical help right away. Call your  local emergency services (911 in the [place] Do not drive yourself to the hospital. Summary If a person has gastroesophageal reflux disease (GERD), food and stomach acid move back up into the esophagus and cause symptoms or problems such as damage to the esophagus. Treatment will depend on how bad your symptoms are. Follow a diet as told by your doctor. Take all medicines only as told by your doctor. This information is not intended to replace advice given to you by your health care provider. Make sure you discuss any questions you have with your healthcare provider. Document Revised: 11/02/2019 Document Reviewed: 11/02/2019 Elsevier Patient Education  [place].

## 2020-11-17 NOTE — Progress Notes (Signed)
Meds ordered this encounter  Medications   buPROPion (WELLBUTRIN SR) 200 MG 12 hr tablet    Sig: Take 1 tablet (200 mg total) by mouth 2 (two) times daily.    Dispense:  180 tablet    Refill:  2    Order Specific Question:   Supervising Provider    Answer:   Tresa Garter [[id number]]      Donia Pounds  APRN, MSN, FNP-C Patient [facility] [address] [place], [place] [address] [phone]

## 2020-11-17 NOTE — Progress Notes (Signed)
11/17/2020 Latausha Flamm [id number] 05/17/1950   CHIEF COMPLAINT: Acid reflux, schedule a colonoscopy   HISTORY OF PRESENT ILLNESS: Dorien Bessent is a 70 year old female with a past medical history of PE 2019 not on anticoagulation, COPD uses oxygen Q HS and during the daytime as needed, seizures (last seizure was 02/18/2019) and GERD.  Covid 19 positive 01/2020. She presents to our office today as referred by Cammie Sickle NP to schedule a screening colonoscopy.  She also complains of having acid reflux for the past 3 years.  She takes Omeprazole 20 mg daily for the past 3 years which significantly improved her heartburn symptoms.  However, if she skips 2 days of omeprazole she develops severe burning acid reflux.  She takes Meloxicam 15 mg once daily since 06/2020.  No other NSAID use.  No upper abdominal pain.  No history of ulcers.  She has intermittent left lower quadrant abdominal pain which comes and goes, no LLQ pain for the past month.  No history of diverticulitis.  She is passing normal formed brown bowel movement daily.  No rectal bleeding or black stools.  She underwent an EGD 30 years ago in [place] which possibly showed a hiatal hernia or some abnormality to her duodenum.  She underwent a colonoscopy 30 years ago as well which she reported was normal, no polyps.  No known family history of esophageal, gastric or colon cancer.  No other complaints at this time.  CMP Latest Ref Rng & Units 06/16/2019 03/21/2019 03/20/2019  Glucose 65 - 99 mg/dL 86 169(H) -  BUN 8 - 27 mg/dL 14 15 -  Creatinine 0.57 - 1.00 mg/dL 0.71 0.75 0.75  Sodium 134 - 144 mmol/L 141 137 -  Potassium 3.5 - 5.2 mmol/L 4.5 4.6 -  Chloride 96 - 106 mmol/L 102 102 -  CO2 20 - 29 mmol/L 21 26 -  Calcium 8.7 - 10.3 mg/dL 9.4 9.0 -    CBC Latest Ref Rng & Units 06/16/2019 03/21/2019 03/20/2019  WBC 3.4 - 10.8 x10E3/uL 6.2 5.9 8.6  Hemoglobin 11.1 - 15.9 g/dL 13.5 13.3 13.0  Hematocrit 34.0 - 46.6 % 41.9 41.5 39.9   Platelets 150 - 450 x10E3/uL 240 233 231    Past Medical History:  Diagnosis Date   COPD (chronic obstructive pulmonary disease) ([place])    Pulmonary embolism (H[facility])    Seizures (HCC)    Past Surgical History:  Procedure Laterality Date   TONSILLECTOMY     TUBAL LIGATION    She denies any problems with past sedation, general anesthesia or airway management/intubation with any past procedure or surgery.  Social History: She is divorced.  She has 1 son and 3 daughters.  She is retired.  She smoked cigarettes for 40 years, quit smoking about 2 years ago.  No alcohol use.  No drug use.  Family History: No family history of esophageal, gastric or colon cancer.  Father with history of COPD.  Brother with emphysema.  Allergies  Allergen Reactions   Penicillins Anaphylaxis     Outpatient Encounter Medications as of 11/17/2020  Medication Sig   buPROPion (WELLBUTRIN SR) 200 MG 12 hr tablet TAKE 1 TABLET BY MOUTH TWICE A DAY   meloxicam (MOBIC) 15 MG tablet Take 1 tablet (15 mg total) by mouth daily.   omeprazole (PRILOSEC) 20 MG capsule TAKE 1 CAPSULE BY MOUTH EVERY DAY   oxybutynin (DITROPAN-XL) 10 MG 24 hr tablet Take 1 tablet (10 mg total) by mouth  at bedtime.   OXYGEN Inhale into the lungs as needed.   SUPREP BOWEL PREP KIT 17.5-3.13-1.6 GM/177ML SOLN Take 1 kit by mouth as directed. For colonoscopy prep   levETIRAcetam (KEPPRA) 500 MG tablet Take 1 tablet (500 mg total) by mouth 2 (two) times daily.   [DISCONTINUED] VENTOLIN HFA 108 (90 Base) MCG/ACT inhaler    No facility-administered encounter medications on file as of 11/17/2020.    REVIEW OF SYSTEMS:  Gen: Denies fever, sweats or chills. No weight loss.  CV: Denies chest pain, palpitations or edema. Resp: See HPI.  Denies cough, shortness of breath of hemoptysis.  GI: See HPI. GU : + Urinary leakage, increased urinary frequency. MS: + Arthritis.  Derm: Denies rash, itchiness, skin lesions or unhealing ulcers. Psych: Denies  depression, anxiety or memory loss. Heme: Denies bruising, bleeding. Neuro: History of seizures disorder, last seizure was 02/18/2019. Endo:  Denies any problems with DM, thyroid or adrenal function.   PHYSICAL EXAM: BP (!) 158/72   Pulse 62   Ht '5\' 4"'  (1.626 m)   Wt 202 lb (91.6 kg)   BMI 34.67 kg/m  General: 70 year old female no acute distress. Head: Normocephalic and atraumatic. Eyes:  Sclerae non-icteric, conjunctive pink. Ears: Normal auditory acuity. Mouth: Scattered missing dentition. No ulcers or lesions.  Neck: Supple, no lymphadenopathy or thyromegaly.  Lungs: Clear bilaterally to auscultation without wheezes, crackles or rhonchi. Heart: Regular rate and rhythm. No murmur, rub or gallop appreciated.  Abdomen: Soft, nontender, non distended. No masses. No hepatosplenomegaly. Normoactive bowel sounds x 4 quadrants.  Rectal: Deferred.  Musculoskeletal: Symmetrical with no gross deformities. Skin: Warm and dry. No rash or lesions on visible extremities. Extremities: No edema. Neurological: Alert oriented x 4, no focal deficits.  Psychological:  Alert and cooperative. Normal mood and affect.  ASSESSMENT AND PLAN:  66.  70 year old female presents to schedule a screening colonoscopy.  Colonoscopy 30 years ago reported as normal by the patient. -Colonoscopy to be scheduled at [facility] benefits and risks discussed including risk with sedation, risk of bleeding, perforation and infection   2. GERD, dependent on PPI. + NSAID use.  -EGD to be scheduled at [facility], rule out Barrett's esophagus/GERD/PUD. EGD benefits and risks discussed including risk with sedation, risk of bleeding, perforation and infection  -Continue Omeprazole 20 mg daily -GERD Diet handout   3.  History of COPD, patient utilizes oxygen 2 L nasal cannula at home nightly and as needed during the daytime hours.   4.  History of seizure disorder on Keppra.  Last seizure was on 02/18/2019  5. History of PE, patient  reports due to tobacco use (records of PE not found in Epic or Care everywhere). Not on anticoagulation.             CC:  Dorena Dew, FNP

## 2020-11-18 ENCOUNTER — Telehealth: Payer: Self-pay

## 2020-11-18 NOTE — Telephone Encounter (Signed)
Author: Mansouraty, Telford Nab., MD Service: Gastroenterology Author Type: Physician  Filed: 11/18/2020  4:12 AM Encounter Date: 11/17/2020 Status: Signed  Editor: Mansouraty, Telford Nab., MD (Physician)                       Attending 68 Attestation    I have reviewed the chart.   I agree with the Advanced Practitioner's note, impression, and recommendations with any updates as below. EGD/colonoscopy in hospital-based setting reasonable.  Please move forward with scheduling in August/September (preference on [facility] yellow block morning).     Justice Britain, MD [facility]

## 2020-11-18 NOTE — Telephone Encounter (Signed)
The pt has been scheduled colon EGD at her office visit.

## 2020-11-18 NOTE — Progress Notes (Signed)
Attending Physician's Attestation   I have reviewed the chart.   I agree with the Advanced Practitioner's note, impression, and recommendations with any updates as below. EGD/colonoscopy in hospital-based setting reasonable.  Please move forward with scheduling in August/September (preference on [facility] yellow block morning).   Justice Britain, MD [facility] Office # [phone]

## 2020-11-18 NOTE — Telephone Encounter (Signed)
-----   Message from Irving Copas., MD sent at 11/18/2020  4:08 AM EDT -----    ----- Message ----- From: Noralyn Pick, NP Sent: 11/17/2020   4:34 PM EDT To: Irving Copas., MD

## 2020-11-22 DIAGNOSIS — Z20822 Contact with and (suspected) exposure to covid-19: Secondary | ICD-10-CM | POA: Diagnosis not present

## 2020-11-25 ENCOUNTER — Telehealth: Payer: Self-pay | Admitting: Nurse Practitioner

## 2020-11-25 NOTE — Telephone Encounter (Signed)
Called to confirm appt pt confirmed they will be here for Monday appt

## 2020-11-28 ENCOUNTER — Ambulatory Visit: Payer: Medicare Other

## 2020-12-09 ENCOUNTER — Other Ambulatory Visit: Payer: Self-pay | Admitting: Family Medicine

## 2020-12-09 DIAGNOSIS — N3289 Other specified disorders of bladder: Secondary | ICD-10-CM

## 2020-12-09 DIAGNOSIS — N3281 Overactive bladder: Secondary | ICD-10-CM

## 2020-12-20 ENCOUNTER — Encounter: Payer: Medicare Other | Admitting: Family Medicine

## 2020-12-22 ENCOUNTER — Ambulatory Visit (INDEPENDENT_AMBULATORY_CARE_PROVIDER_SITE_OTHER): Payer: Medicare Other | Admitting: Family Medicine

## 2020-12-22 ENCOUNTER — Encounter: Payer: Self-pay | Admitting: Family Medicine

## 2020-12-22 ENCOUNTER — Other Ambulatory Visit: Payer: Self-pay | Admitting: Family Medicine

## 2020-12-22 ENCOUNTER — Other Ambulatory Visit: Payer: Self-pay

## 2020-12-22 VITALS — BP 151/64 | HR 57 | Temp 97.3°F | Ht 64.0 in | Wt 205.0 lb

## 2020-12-22 DIAGNOSIS — F32A Depression, unspecified: Secondary | ICD-10-CM

## 2020-12-22 DIAGNOSIS — R739 Hyperglycemia, unspecified: Secondary | ICD-10-CM

## 2020-12-22 DIAGNOSIS — Z9981 Dependence on supplemental oxygen: Secondary | ICD-10-CM | POA: Diagnosis not present

## 2020-12-22 DIAGNOSIS — I10 Essential (primary) hypertension: Secondary | ICD-10-CM

## 2020-12-22 DIAGNOSIS — J449 Chronic obstructive pulmonary disease, unspecified: Secondary | ICD-10-CM | POA: Diagnosis not present

## 2020-12-22 DIAGNOSIS — R635 Abnormal weight gain: Secondary | ICD-10-CM

## 2020-12-22 DIAGNOSIS — Z8669 Personal history of other diseases of the nervous system and sense organs: Secondary | ICD-10-CM | POA: Diagnosis not present

## 2020-12-22 DIAGNOSIS — E669 Obesity, unspecified: Secondary | ICD-10-CM

## 2020-12-22 LAB — GLUCOSE, POCT (MANUAL RESULT ENTRY): POC Glucose: 106 mg/dl — AB (ref 70–99)

## 2020-12-22 LAB — POCT URINALYSIS DIPSTICK
Bilirubin, UA: NEGATIVE
Blood, UA: NEGATIVE
Glucose, UA: NEGATIVE
Ketones, UA: NEGATIVE
Leukocytes, UA: NEGATIVE
Nitrite, UA: NEGATIVE
Protein, UA: NEGATIVE
Spec Grav, UA: 1.025 (ref 1.010–1.025)
Urobilinogen, UA: 0.2 E.U./dL
pH, UA: 6.5 (ref 5.0–8.0)

## 2020-12-22 LAB — POCT GLYCOSYLATED HEMOGLOBIN (HGB A1C): Hemoglobin A1C: 5.5 % (ref 4.0–5.6)

## 2020-12-22 MED ORDER — HYDROCHLOROTHIAZIDE 12.5 MG PO TABS: 12.5000 mg | ORAL_TABLET | Freq: Every day | ORAL | 3 refills | Status: DC

## 2020-12-22 NOTE — Patient Instructions (Signed)
Hydrochlorothiazide Capsules or Tablets What is this medication? HYDROCHLOROTHIAZIDE (hye droe klor oh THYE a zide) treats high blood pressure. It may also be used to reduce swelling related to heart, kidney, or liver disease. It helps your kidneys remove more fluid and salt from your bloodthrough the urine. It belongs to a group of medications called diuretics. This medicine may be used for other purposes; ask your health care provider orpharmacist if you have questions. COMMON BRAND NAME(S): Esidrix, Ezide, HydroDIURIL, Microzide, Oretic, Zide What should I tell my care team before I take this medication? They need to know if you have any of these conditions: Diabetes Gout Kidney disease Liver disease Lupus Pancreatitis An unusual or allergic reaction to hydrochlorothiazide, sulfa drugs, other medications, foods, dyes, or preservatives Pregnant or trying to get pregnant Breast-feeding How should I use this medication? Take this medication by mouth. Take it as directed on the prescription label at the same time every day. You can take it with or without food. If it upsets your stomach, take it with food. Keep taking it unless your care team tells youto stop. Talk to your care team about the use of this medication in children. While it may be prescribed for children as young as newborns for selected conditions,precautions do apply. Overdosage: If you think you have taken too much of this medicine contact apoison control center or emergency room at once. NOTE: This medicine is only for you. Do not share this medicine with others. What if I miss a dose? If you miss a dose, take it as soon as you can. If it is almost time for yournext dose, take only that dose. Do not take double or extra doses. What may interact with this medication? Cholestyramine Colestipol Digoxin Dofetilide Lithium Medications for blood pressure Medications for diabetes Medications that relax muscles for  surgery Other diuretics Steroid medications like prednisone or cortisone This list may not describe all possible interactions. Give your health care provider a list of all the medicines, herbs, non-prescription drugs, or dietary supplements you use. Also tell them if you smoke, drink alcohol, or use illegaldrugs. Some items may interact with your medicine. What should I watch for while using this medication? Visit your health care provider for regular check-ups. Check your blood pressure as directed. Ask your health care provider what your blood pressureshould be. Also, find out when you should contact him or her. Do not treat yourself for coughs, colds, or pain while you are using this medication without asking your health care provider for advice. Somemedications may increase your blood pressure. You may get drowsy or dizzy. Do not drive, use machinery, or do anything that needs mental alertness until you know how this medication affects you. Do not stand or sit up quickly, especially if you are an older patient. This reduces the risk of dizzy or fainting spells. Alcohol can make you more drowsy anddizzy. Avoid alcoholic drinks. Talk to your health care professional about your risk of skin cancer. You maybe more at risk for skin cancer if you take this medication. This medication can make you more sensitive to the sun. Keep out of the sun. If you cannot avoid being in the sun, wear protective clothing and use sunscreen.Do not use sun lamps or tanning beds/booths. You may need to be on a special diet while taking this medication. Ask your health care provider. Also, find out how many glasses of fluids you need todrink each day. Check with your health care provider if you  get an attack of severe diarrhea, nausea and vomiting, or if you sweat a lot. The loss of too much body fluid canmake it dangerous for you to take this medication. This medication may increase blood sugar. Ask your healthcare provider  ifchanges in diet or medications are needed if you have diabetes. What side effects may I notice from receiving this medication? Side effects that you should report to your care team as soon as possible: Allergic reactions-skin rash, itching, hives, swelling of the face, lips, tongue, or throat Dehydration-increased thirst, dry mouth, feeling faint or lightheaded, headache, dark yellow or brown urine Gout-severe pain, redness, warmth, or swelling in joints, such as the big toe Kidney injury-decrease in the amount of urine, swelling of the ankles, hands, or feet Low blood pressure-dizziness, feeling faint or lightheaded, blurry vision Low potassium level-muscle pain or cramps, unusual weakness, fatigue, fast or irregular heartbeat, constipation Sudden eye pain or change in vision such as blurred vision, seeing halos around lights, vision loss Side effects that usually do not require medical attention (report to your careteam if they continue or are bothersome): Change in sex drive or performance Headache Upset stomach This list may not describe all possible side effects. Call your doctor for medical advice about side effects. You may report side effects to FDA at1-800-FDA-1088. Where should I keep my medication? Keep out of the reach of children and pets. Store at room temperature between 20 and 25 degrees C (68 and 77 degrees F). Protect from light and moisture. Keep the container tightly closed. Do notfreeze. Get rid of any unused medication after the expiration date. To get rid of medications that are no longer needed or have expired: Take the medication to a medication take-back program. Check with your pharmacy or law enforcement to find a location. If you cannot return the medication, check the label or package insert to see if the medication should be thrown out in the garbage or flushed down the toilet. If you are not sure, ask your care team. If it is safe to put in the trash, empty the  medication out of the container. Mix the medication with cat litter, dirt, coffee grounds, or other unwanted substance. Seal the mixture in a bag or container. Put it in the trash. NOTE: This sheet is a summary. It may not cover all possible information. If you have questions about this medicine, talk to your doctor, pharmacist, orhealth care provider.  2022 Elsevier/Gold Standard (2020-05-20 12:36:20)

## 2020-12-22 NOTE — Progress Notes (Signed)
[facility]  Established Patient Office Visit  Subjective:  Patient ID: Eileen Morgan, female    DOB: Jan 21, 1951  Age: 70 y.o. MRN: [id number]  CC:  Chief Complaint  Patient presents with   Follow-up    Continues to be on 2L of oxygen, needs to follow up with [facility]     HPI Eileen Morgan is a 70 year old female with a medical history significant for COPD, history of pulmonary embolism, completed Xarelto, history of seizure disorder, and history of obesity presents for follow-up of chronic conditions.  Patient is complaining of low oxygen saturation.  She states that her oxygen saturation has been between 87 and 89%.  She endorses some shortness of breath with exertion.  She denies any chest pain, dizziness, or paresthesias.  Patient is followed by pulmonology every 3 months.  She has not had an exacerbation of COPD in greater than a year.  Patient does not have a rescue inhaler at this time.  Eileen Morgan is on 2 L supplemental oxygen at home. Patient also states that she has had elevated blood pressures over the past 3 months.  She does not have a diagnosis of hypertension.  She states that she has a family history of high blood pressure.  Patient does not follow a low-fat, low carbohydrate diet.  She does not exercise consistently due to chronic knee pain.  Patient endorses a 20 pound weight gain over the past year.  She attributes weight gain to smoking cessation and sugary drinks.  She has not been screened for diabetes.  She does not have a family history of type 2 diabetes.  She denies chest pains, orthopnea, heart palpitations, or lower extremity swelling.  Past Medical History:  Diagnosis Date   COPD (chronic obstructive pulmonary disease) ([place])    Pulmonary embolism (HCC)    Seizures (HCC)     Past Surgical History:  Procedure Laterality Date   TONSILLECTOMY     TUBAL LIGATION      Family History  Problem Relation Age  of Onset   COPD Father    Emphysema Brother    CAD Neg Hx    Diabetes Mellitus II Neg Hx     Social History   Socioeconomic History   Marital status: Single    Spouse name: Not on file   Number of children: Not on file   Years of education: Not on file   Highest education level: Not on file  Occupational History   Not on file  Tobacco Use   Smoking status: Former    Packs/day: 1.00    Years: 40.00    Pack years: 40.00    Types: Cigarettes    Quit date: 03/08/2019    Years since quitting: 1.7   Smokeless tobacco: Never  Vaping Use   Vaping Use: Never used  Substance and Sexual Activity   Alcohol use: Not Currently   Drug use: Never   Sexual activity: Not on file  Other Topics Concern   Not on file  Social History Narrative   Not on file   Social Determinants of Health   Financial Resource Strain: Not on file  Food Insecurity: Not on file  Transportation Needs: Not on file  Physical Activity: Not on file  Stress: Not on file  Social Connections: Not on file  Intimate Partner Violence: Not on file    Outpatient Medications Prior to Visit  Medication Sig Dispense Refill  buPROPion (WELLBUTRIN SR) 200 MG 12 hr tablet Take 1 tablet (200 mg total) by mouth 2 (two) times daily. 180 tablet 2   meloxicam (MOBIC) 15 MG tablet Take 1 tablet (15 mg total) by mouth daily. 30 tablet 5   omeprazole (PRILOSEC) 20 MG capsule TAKE 1 CAPSULE BY MOUTH EVERY DAY 90 capsule 1   oxybutynin (DITROPAN-XL) 10 MG 24 hr tablet TAKE 1 TABLET BY MOUTH EVERYDAY AT BEDTIME 30 tablet 1   OXYGEN Inhale into the lungs as needed.     SUPREP BOWEL PREP KIT 17.5-3.13-1.6 GM/177ML SOLN Take 1 kit by mouth as directed. For colonoscopy prep 354 mL 0   levETIRAcetam (KEPPRA) 500 MG tablet Take 1 tablet (500 mg total) by mouth 2 (two) times daily. 60 tablet 5   No facility-administered medications prior to visit.    Allergies  Allergen Reactions   Penicillins Anaphylaxis    ROS Review of  Systems  Constitutional:  Negative for activity change and appetite change.  HENT: Negative.    Eyes: Negative.   Respiratory: Negative.    Cardiovascular: Negative.   Endocrine: Negative for polydipsia, polyphagia and polyuria.  Genitourinary: Negative.   Musculoskeletal: Negative.   Skin: Negative.   Allergic/Immunologic: Negative.   Neurological:  Negative for dizziness and facial asymmetry.  Psychiatric/Behavioral: Negative.       Objective:    Physical Exam Constitutional:      Appearance: Normal appearance. She is obese.  Eyes:     Pupils: Pupils are equal, round, and reactive to light.  Cardiovascular:     Rate and Rhythm: Normal rate and regular rhythm.     Pulses: Normal pulses.  Pulmonary:     Effort: Pulmonary effort is normal.  Abdominal:     General: Bowel sounds are normal.  Skin:    General: Skin is warm.  Neurological:     General: No focal deficit present.     Mental Status: She is alert. Mental status is at baseline.  Psychiatric:        Mood and Affect: Mood normal.        Behavior: Behavior normal.        Thought Content: Thought content normal.        Judgment: Judgment normal.    BP (!) 151/64 (BP Location: Left Arm, Patient Position: Sitting)   Pulse (!) 57   Temp (!) 97.3 F (36.3 C)   Ht '5\' 4"'  (1.626 m)   Wt 205 lb 0.6 oz (93 kg)   SpO2 (!) 89%   BMI 35.19 kg/m  Wt Readings from Last 3 Encounters:  12/22/20 205 lb 0.6 oz (93 kg)  11/17/20 202 lb (91.6 kg)  06/29/20 201 lb (91.2 kg)     Health Maintenance Due  Topic Date Due   Hepatitis C Screening  Never done   TETANUS/TDAP  Never done   COLONOSCOPY (Pts 45-37yr Insurance coverage will need to be confirmed)  Never done   MAMMOGRAM  Never done   Zoster Vaccines- Shingrix (1 of 2) Never done   DEXA SCAN  Never done   COVID-19 Vaccine (3 - Booster for Pfizer series) 10/09/2020   INFLUENZA VACCINE  12/05/2020    There are no preventive care reminders to display for this  patient.  No results found for: TSH Lab Results  Component Value Date   WBC 6.2 06/16/2019   HGB 13.5 06/16/2019   HCT 41.9 06/16/2019   MCV 92 06/16/2019   PLT 240 06/16/2019  Lab Results  Component Value Date   NA 141 06/16/2019   K 4.5 06/16/2019   CO2 21 06/16/2019   GLUCOSE 86 06/16/2019   BUN 14 06/16/2019   CREATININE 0.71 06/16/2019   CALCIUM 9.4 06/16/2019   ANIONGAP 9 03/21/2019   No results found for: CHOL No results found for: HDL No results found for: LDLCALC No results found for: TRIG No results found for: CHOLHDL No results found for: HGBA1C    Assessment & Plan:   Problem List Items Addressed This Visit       Other   History of seizure disorder   Depression - Primary   Other Visit Diagnoses     Chronic obstructive pulmonary disease, unspecified COPD type ([place])       On home oxygen therapy       Obesity (BMI 35.0-39.9 without comorbidity)       Essential hypertension       Relevant Medications   hydrochlorothiazide (HYDRODIURIL) 12.5 MG tablet   Other Relevant Orders   CMP and Liver (Completed)   Urinalysis Dipstick (Completed)   Lipid Panel (Completed)   Weight gain       Relevant Orders   TSH (Completed)   Hyperglycemia       Relevant Orders   HgB A1c (Completed)   Glucose (CBG) (Completed)      1. Depression, unspecified depression type Flowsheet Row Office Visit from 12/22/2020 in [place]  PHQ-2 Total Score 0      No medication changes warranted on today.   2. Chronic obstructive pulmonary disease, unspecified COPD type ([place]) Follow up with pulmonologist at scheduled   3. History of seizure disorder Continue Keppra 500 mg BID  4. On home oxygen therapy Discussed the importance of utilizing home oxygen consistently in order to achieve positive outcomes.  Patient expressed understanding.  5. Obesity (BMI 35.0-39.9 without comorbidity) The patient is asked to make an attempt to improve diet and  exercise patterns to aid in medical management of this problem.   6. Essential hypertension BP (!) 151/64 (BP Location: Left Arm, Patient Position: Sitting)   Pulse (!) 57   Temp (!) 97.3 F (36.3 C)   Ht '5\' 4"'  (1.626 m)   Wt 205 lb 0.6 oz (93 kg)   SpO2 (!) 89%   BMI 35.19 kg/m  - Continue medication, monitor blood pressure at home. Continue DASH diet.  Reminder to go to the ER if any CP, SOB, nausea, dizziness, severe HA, changes vision/speech, left arm numbness and tingling and jaw pain.  Start hydrochlorothiazide 12.5 mg daily for hypertension. Blood pressure check in 2 weeks - CMP and Liver - Urinalysis Dipstick - Lipid Panel  7. Weight gain  - TSH  8. Hyperglycemia  - HgB A1c - Glucose (CBG)  Follow-up: No follow-ups on file.    Donia Pounds  APRN, MSN, FNP-C Patient [employer] [address] [place], [place] [address] [phone]

## 2020-12-23 LAB — TSH: TSH: 3.54 u[IU]/mL (ref 0.450–4.500)

## 2020-12-23 LAB — LIPID PANEL
Chol/HDL Ratio: 2.8 ratio (ref 0.0–4.4)
Cholesterol, Total: 177 mg/dL (ref 100–199)
HDL: 63 mg/dL (ref >39)
LDL Chol Calc (NIH): 87 mg/dL (ref 0–99)
Triglycerides: 161 mg/dL — ABNORMAL HIGH (ref 0–149)
VLDL Cholesterol Cal: 27 mg/dL (ref 5–40)

## 2020-12-23 LAB — CMP AND LIVER
ALT: 21 IU/L (ref 0–32)
AST: 18 IU/L (ref 0–40)
Albumin: 4.3 g/dL (ref 3.7–4.7)
Alkaline Phosphatase: 74 IU/L (ref 44–121)
BUN: 17 mg/dL (ref 8–27)
Bilirubin Total: 0.4 mg/dL (ref 0.0–1.2)
Bilirubin, Direct: 0.11 mg/dL (ref 0.00–0.40)
CO2: 25 mmol/L (ref 20–29)
Calcium: 9.2 mg/dL (ref 8.7–10.3)
Chloride: 103 mmol/L (ref 96–106)
Creatinine, Ser: 0.87 mg/dL (ref 0.57–1.00)
Glucose: 93 mg/dL (ref 65–99)
Potassium: 4.7 mmol/L (ref 3.5–5.2)
Sodium: 141 mmol/L (ref 134–144)
Total Protein: 6.1 g/dL (ref 6.0–8.5)
eGFR: 71 mL/min/{1.73_m2} (ref >59)

## 2020-12-26 ENCOUNTER — Ambulatory Visit
Admission: RE | Admit: 2020-12-26 | Discharge: 2020-12-26 | Disposition: A | Discharge status: Home / Self Care | Payer: Medicare Other | Source: Ambulatory Visit | Attending: Family Medicine | Admitting: Family Medicine

## 2020-12-26 ENCOUNTER — Other Ambulatory Visit: Payer: Self-pay

## 2020-12-26 DIAGNOSIS — Z1231 Encounter for screening mammogram for malignant neoplasm of breast: Secondary | ICD-10-CM

## 2020-12-28 ENCOUNTER — Other Ambulatory Visit: Payer: Self-pay | Admitting: Family Medicine

## 2020-12-28 DIAGNOSIS — R928 Other abnormal and inconclusive findings on diagnostic imaging of breast: Secondary | ICD-10-CM

## 2021-01-03 ENCOUNTER — Other Ambulatory Visit: Payer: Self-pay | Admitting: Family Medicine

## 2021-01-03 DIAGNOSIS — M25561 Pain in right knee: Secondary | ICD-10-CM

## 2021-01-11 NOTE — Progress Notes (Signed)
Spoke with pt. Given pre-op instructions.

## 2021-01-12 ENCOUNTER — Ambulatory Visit (HOSPITAL_COMMUNITY): Payer: Medicare Other | Admitting: Certified Registered Nurse Anesthetist

## 2021-01-12 ENCOUNTER — Other Ambulatory Visit: Payer: Self-pay

## 2021-01-12 ENCOUNTER — Encounter (HOSPITAL_COMMUNITY)
Admission: RE | Disposition: A | Discharge status: Home / Self Care | Payer: Self-pay | Source: Home / Self Care | Attending: Gastroenterology

## 2021-01-12 ENCOUNTER — Ambulatory Visit (HOSPITAL_COMMUNITY)
Admission: RE | Admit: 2021-01-12 | Discharge: 2021-01-12 | Disposition: A | Discharge status: Home / Self Care | Payer: Medicare Other | Attending: Gastroenterology | Admitting: Gastroenterology

## 2021-01-12 ENCOUNTER — Encounter (HOSPITAL_COMMUNITY): Payer: Self-pay | Admitting: Gastroenterology

## 2021-01-12 ENCOUNTER — Other Ambulatory Visit: Payer: Self-pay | Admitting: Family Medicine

## 2021-01-12 DIAGNOSIS — K219 Gastro-esophageal reflux disease without esophagitis: Secondary | ICD-10-CM | POA: Diagnosis present

## 2021-01-12 DIAGNOSIS — K641 Second degree hemorrhoids: Secondary | ICD-10-CM | POA: Insufficient documentation

## 2021-01-12 DIAGNOSIS — K449 Diaphragmatic hernia without obstruction or gangrene: Secondary | ICD-10-CM | POA: Insufficient documentation

## 2021-01-12 DIAGNOSIS — Z880 Allergy status to penicillin: Secondary | ICD-10-CM | POA: Insufficient documentation

## 2021-01-12 DIAGNOSIS — K635 Polyp of colon: Secondary | ICD-10-CM | POA: Insufficient documentation

## 2021-01-12 DIAGNOSIS — Z79899 Other long term (current) drug therapy: Secondary | ICD-10-CM | POA: Diagnosis not present

## 2021-01-12 DIAGNOSIS — K3189 Other diseases of stomach and duodenum: Secondary | ICD-10-CM | POA: Insufficient documentation

## 2021-01-12 DIAGNOSIS — Q438 Other specified congenital malformations of intestine: Secondary | ICD-10-CM | POA: Insufficient documentation

## 2021-01-12 DIAGNOSIS — R6881 Early satiety: Secondary | ICD-10-CM | POA: Diagnosis not present

## 2021-01-12 DIAGNOSIS — Z87891 Personal history of nicotine dependence: Secondary | ICD-10-CM | POA: Diagnosis not present

## 2021-01-12 DIAGNOSIS — N3289 Other specified disorders of bladder: Secondary | ICD-10-CM

## 2021-01-12 DIAGNOSIS — Z86711 Personal history of pulmonary embolism: Secondary | ICD-10-CM | POA: Diagnosis not present

## 2021-01-12 DIAGNOSIS — K209 Esophagitis, unspecified without bleeding: Secondary | ICD-10-CM | POA: Insufficient documentation

## 2021-01-12 DIAGNOSIS — K644 Residual hemorrhoidal skin tags: Secondary | ICD-10-CM | POA: Insufficient documentation

## 2021-01-12 DIAGNOSIS — Q399 Congenital malformation of esophagus, unspecified: Secondary | ICD-10-CM | POA: Diagnosis not present

## 2021-01-12 DIAGNOSIS — N3281 Overactive bladder: Secondary | ICD-10-CM

## 2021-01-12 DIAGNOSIS — Z9981 Dependence on supplemental oxygen: Secondary | ICD-10-CM | POA: Diagnosis not present

## 2021-01-12 DIAGNOSIS — Z1211 Encounter for screening for malignant neoplasm of colon: Secondary | ICD-10-CM | POA: Insufficient documentation

## 2021-01-12 DIAGNOSIS — K5730 Diverticulosis of large intestine without perforation or abscess without bleeding: Secondary | ICD-10-CM | POA: Diagnosis not present

## 2021-01-12 DIAGNOSIS — K633 Ulcer of intestine: Secondary | ICD-10-CM

## 2021-01-12 HISTORY — PX: ESOPHAGOGASTRODUODENOSCOPY (EGD) WITH PROPOFOL: SHX5813

## 2021-01-12 HISTORY — PX: BIOPSY: SHX5522

## 2021-01-12 HISTORY — PX: POLYPECTOMY: SHX5525

## 2021-01-12 HISTORY — PX: COLONOSCOPY WITH PROPOFOL: SHX5780

## 2021-01-12 SURGERY — COLONOSCOPY WITH PROPOFOL
Anesthesia: Monitor Anesthesia Care

## 2021-01-12 MED ORDER — PROPOFOL 500 MG/50ML IV EMUL
INTRAVENOUS | Status: DC | PRN
  Administered 2021-01-12: 100 ug/kg/min via INTRAVENOUS

## 2021-01-12 MED ORDER — PROPOFOL 10 MG/ML IV BOLUS
INTRAVENOUS | Status: DC | PRN
  Administered 2021-01-12 (×2): 20 mg via INTRAVENOUS

## 2021-01-12 MED ORDER — LIDOCAINE 2% (20 MG/ML) 5 ML SYRINGE
INTRAMUSCULAR | Status: DC | PRN
  Administered 2021-01-12: 100 mg via INTRAVENOUS

## 2021-01-12 MED ORDER — LACTATED RINGERS IV SOLN
INTRAVENOUS | Status: DC | PRN
Start: 2021-01-12 — End: 2021-01-12

## 2021-01-12 MED ORDER — EPHEDRINE SULFATE-NACL 50-0.9 MG/10ML-% IV SOSY
PREFILLED_SYRINGE | INTRAVENOUS | Status: DC | PRN
  Administered 2021-01-12: 10 mg via INTRAVENOUS

## 2021-01-12 MED ORDER — SODIUM CHLORIDE 0.9 % IV SOLN: INTRAVENOUS | Status: DC

## 2021-01-12 SURGICAL SUPPLY — 24 items

## 2021-01-12 NOTE — Anesthesia Postprocedure Evaluation (Signed)
Anesthesia Post Note  Patient: Eileen Morgan  Procedure(s) Performed: COLONOSCOPY WITH PROPOFOL ESOPHAGOGASTRODUODENOSCOPY (EGD) WITH PROPOFOL BIOPSY POLYPECTOMY     Patient location during evaluation: Endoscopy Anesthesia Type: MAC Level of consciousness: awake and alert Pain management: pain level controlled Vital Signs Assessment: post-procedure vital signs reviewed and stable Respiratory status: spontaneous breathing, nonlabored ventilation and respiratory function stable Cardiovascular status: blood pressure returned to baseline and stable Postop Assessment: no apparent nausea or vomiting Anesthetic complications: no   No notable events documented.  Last Vitals:  Vitals:   01/12/21 0839 01/12/21 0848  BP: (!) 121/43 (!) 144/65  Pulse: 62 (!) 57  Resp: 15 14  Temp:    SpO2: 100% 95%    Last Pain:  Vitals:   01/12/21 0848  TempSrc:   PainSc: 0-No pain                 Lidia Collum

## 2021-01-12 NOTE — Discharge Instructions (Signed)
YOU HAD AN ENDOSCOPIC PROCEDURE TODAY: Refer to the procedure report and other information in the discharge instructions given to you for any specific questions about what was found during the examination. If this information does not answer your questions, please call [place] office at [phone] to clarify.  ° °YOU SHOULD EXPECT: Some feelings of bloating in the abdomen. Passage of more gas than usual. Walking can help get rid of the air that was put into your GI tract during the procedure and reduce the bloating. If you had a lower endoscopy (such as a colonoscopy or flexible sigmoidoscopy) you may notice spotting of blood in your stool or on the toilet paper. Some abdominal soreness may be present for a day or two, also. ° °DIET: Your first meal following the procedure should be a light meal and then it is ok to progress to your normal diet. A half-sandwich or bowl of soup is an example of a good first meal. Heavy or fried foods are harder to digest and may make you feel nauseous or bloated. Drink plenty of fluids but you should avoid alcoholic beverages for 24 hours. If you had a esophageal dilation, please see attached instructions for diet.   ° °ACTIVITY: Your care partner should take you home directly after the procedure. You should plan to take it easy, moving slowly for the rest of the day. You can resume normal activity the day after the procedure however YOU SHOULD NOT DRIVE, use power tools, machinery or perform tasks that involve climbing or major physical exertion for 24 hours (because of the sedation medicines used during the test).  ° °SYMPTOMS TO REPORT IMMEDIATELY: °A gastroenterologist can be reached at any hour. Please call [phone]  for any of the following symptoms:  °Following lower endoscopy (colonoscopy, flexible sigmoidoscopy) °Excessive amounts of blood in the stool  °Significant tenderness, worsening of abdominal pains  °Swelling of the abdomen that is new, acute  °Fever of 100° or  higher  °Following upper endoscopy (EGD, EUS, ERCP, esophageal dilation) °Vomiting of blood or coffee ground material  °New, significant abdominal pain  °New, significant chest pain or pain under the shoulder blades  °Painful or persistently difficult swallowing  °New shortness of breath  °Black, tarry-looking or red, bloody stools ° °FOLLOW UP:  °If any biopsies were taken you will be contacted by phone or by letter within the next 1-3 weeks. Call [phone]  if you have not heard about the biopsies in 3 weeks.  °Please also call with any specific questions about appointments or follow up tests. ° °

## 2021-01-12 NOTE — Anesthesia Preprocedure Evaluation (Signed)
Anesthesia Evaluation  Patient identified by MRN, date of birth, ID band Patient awake    Reviewed: Allergy & Precautions, NPO status , Patient's Chart, lab work & pertinent test results  History of Anesthesia Complications Negative for: history of anesthetic complications  Airway Mallampati: II  TM Distance: >3 FB Neck ROM: Full    Dental  (+) Chipped,    Pulmonary COPD,  oxygen dependent, former smoker, PE   Pulmonary exam normal        Cardiovascular hypertension, Pt. on medications Normal cardiovascular exam     Neuro/Psych Seizures -,  Depression    GI/Hepatic Neg liver ROS, GERD  ,  Endo/Other  negative endocrine ROS  Renal/GU negative Renal ROS  negative genitourinary   Musculoskeletal negative musculoskeletal ROS (+)   Abdominal   Peds  Hematology negative hematology ROS (+)   Anesthesia Other Findings   Reproductive/Obstetrics                            Anesthesia Physical Anesthesia Plan  ASA: 3  Anesthesia Plan: MAC   Post-op Pain Management:    Induction: Intravenous  PONV Risk Score and Plan: Propofol infusion, TIVA and Treatment may vary due to age or medical condition  Airway Management Planned: Natural Airway, Nasal Cannula and Simple Face Mask  Additional Equipment: None  Intra-op Plan:   Post-operative Plan:   Informed Consent: I have reviewed the patients History and Physical, chart, labs and discussed the procedure including the risks, benefits and alternatives for the proposed anesthesia with the patient or authorized representative who has indicated his/her understanding and acceptance.       Plan Discussed with:   Anesthesia Plan Comments:         Anesthesia Quick Evaluation

## 2021-01-12 NOTE — H&P (Signed)
GASTROENTEROLOGY PROCEDURE H&P NOTE   Primary Care Physician: Dorena Dew, FNP  HPI: Eileen Morgan is a 70 y.o. female who presents for EGD/Colonoscopy. EGD for history of GERD.  Colonoscopy for screening.  Past Medical History:  Diagnosis Date   COPD (chronic obstructive pulmonary disease) ([place])    Pulmonary embolism ([facility])    Seizures ([facility])    Past Surgical History:  Procedure Laterality Date   TONSILLECTOMY     TUBAL LIGATION     Current Facility-Administered Medications  Medication Dose Route Frequency Provider Last Rate Last Admin   0.9 %  sodium chloride infusion   Intravenous Continuous Noralyn Pick, NP        Current Facility-Administered Medications:    0.9 %  sodium chloride infusion, , Intravenous, Continuous, Kennedy-Smith, Patrecia Pour, NP Allergies  Allergen Reactions   Penicillins Anaphylaxis   Family History  Problem Relation Age of Onset   COPD Father    Emphysema Brother    CAD Neg Hx    Diabetes Mellitus II Neg Hx    Social History   Socioeconomic History   Marital status: Single    Spouse name: Not on file   Number of children: Not on file   Years of education: Not on file   Highest education level: Not on file  Occupational History   Not on file  Tobacco Use   Smoking status: Former    Packs/day: 1.00    Years: 40.00    Pack years: 40.00    Types: Cigarettes    Quit date: 03/08/2019    Years since quitting: 1.8   Smokeless tobacco: Never  Vaping Use   Vaping Use: Never used  Substance and Sexual Activity   Alcohol use: Not Currently   Drug use: Never   Sexual activity: Not on file  Other Topics Concern   Not on file  Social History Narrative   Not on file   Social Determinants of Health   Financial Resource Strain: Not on file  Food Insecurity: Not on file  Transportation Needs: Not on file  Physical Activity: Not on file  Stress: Not on file  Social Connections: Not on file  Intimate Partner Violence: Not  on file    Physical Exam: Vital signs in last 24 hours: Temp:  [97.3 F (36.3 C)] 97.3 F (36.3 C) (09/08 0656) Pulse Rate:  [73] 73 (09/08 0656) Resp:  [16] 16 (09/08 0656) BP: (151)/(81) 151/81 (09/08 0656) SpO2:  [95 %] 95 % (09/08 0656) Weight:  [90.7 kg] 90.7 kg (09/08 0656)   GEN: NAD EYE: Sclerae anicteric ENT: MMM CV: Non-tachycardic GI: Soft, NT/ND NEURO:  Alert & Oriented x 3  Lab Results: No results for input(s): WBC, HGB, HCT, PLT in the last 72 hours. BMET No results for input(s): NA, K, CL, CO2, GLUCOSE, BUN, CREATININE, CALCIUM in the last 72 hours. LFT No results for input(s): PROT, ALBUMIN, AST, ALT, ALKPHOS, BILITOT, BILIDIR, IBILI in the last 72 hours. PT/INR No results for input(s): LABPROT, INR in the last 72 hours.   Impression / Plan: This is a 70 y.o.female who presents for EGD/Colonoscopy. EGD for history of GERD.  Colonoscopy for screening.  The risks and benefits of endoscopic evaluation/treatment were discussed with the patient and/or family; these include but are not limited to the risk of perforation, infection, bleeding, missed lesions, lack of diagnosis, severe illness requiring hospitalization, as well as anesthesia and sedation related illnesses.  The patient's history has been reviewed,  patient examined, no change in status, and deemed stable for procedure.  The patient and/or family is agreeable to proceed.    Justice Britain, MD [facility] Office # [phone]

## 2021-01-12 NOTE — Transfer of Care (Signed)
Immediate Anesthesia Transfer of Care Note  Patient: Eileen Morgan  Procedure(s) Performed: COLONOSCOPY WITH PROPOFOL ESOPHAGOGASTRODUODENOSCOPY (EGD) WITH PROPOFOL BIOPSY POLYPECTOMY  Patient Location: Endoscopy Unit  Anesthesia Type:MAC  Level of Consciousness: drowsy and patient cooperative  Airway & Oxygen Therapy: Patient Spontanous Breathing and Patient connected to face mask oxygen  Post-op Assessment: Report given to RN and Post -op Vital signs reviewed and stable  Post vital signs: Reviewed and stable  Last Vitals:  Vitals Value Taken Time  BP 122/74   Temp    Pulse 62 01/12/21 0830  Resp 16 01/12/21 0830  SpO2 100 % 01/12/21 0830  Vitals shown include unvalidated device data.  Last Pain:  Vitals:   01/12/21 0656  TempSrc: Temporal  PainSc: 0-No pain         Complications: No notable events documented.

## 2021-01-12 NOTE — Op Note (Signed)
[facility] Patient Name: Eileen Morgan Procedure Date : 01/12/2021 MRN: [id number] Attending MD: Justice Britain , MD Date of Birth: Mar 01, 1951 CSN: [id number] Age: 70 Admit Type: Inpatient Procedure:                Upper GI endoscopy Indications:              Heartburn, Esophageal reflux symptoms that persist                            despite appropriate therapy, Early satiety Providers:                Justice Britain, MD, Burtis Junes, RN, Tyna Jaksch Technician Referring MD:             Noralyn Pick, Asencion Partridge. [place] Medicines:                Monitored Anesthesia Care Complications:            No immediate complications. Estimated Blood Loss:     Estimated blood loss was minimal. Procedure:                Pre-Anesthesia Assessment:                           - Prior to the procedure, a History and Physical                            was performed, and patient medications and                            allergies were reviewed. The patient's tolerance of                            previous anesthesia was also reviewed. The risks                            and benefits of the procedure and the sedation                            options and risks were discussed with the patient.                            All questions were answered, and informed consent                            was obtained. Prior Anticoagulants: The patient has                            taken no previous anticoagulant or antiplatelet                            agents except for NSAID medication. ASA Grade  Assessment: III - A patient with severe systemic                            disease. After reviewing the risks and benefits,                            the patient was deemed in satisfactory condition to                            undergo the procedure.                           After obtaining informed consent, the endoscope  was                            passed under direct vision. Throughout the                            procedure, the patient's blood pressure, pulse, and                            oxygen saturations were monitored continuously. The                            [id number]) Olympus endoscope was introduced                            through the mouth, and advanced to the second part                            of duodenum. The upper GI endoscopy was                            accomplished without difficulty. The patient                            tolerated the procedure. Scope In: Scope Out: Findings:      The examined esophagus was grossly tortuous.      One tongue of salmon-colored mucosa was present from 30 to 31 cm. No       other visible abnormalities were present. Biopsies were taken with a       cold forceps for histology to rule in/out Barrett's.      A 9 cm hiatal hernia was found. The proximal extent of the gastric folds       (end of tubular esophagus) was 32 cm from the incisors. The hiatal       narrowing was 39 cm from the incisors. The Z-line was 31 cm from the       incisors.      Patchy mildly erythematous mucosa without bleeding was found in the       entire examined stomach. Biopsies were taken with a cold forceps for       histology and Helicobacter pylori testing.      No gross lesions were noted in the duodenal bulb, in the first portion  of the duodenum and in the second portion of the duodenum. Impression:               - Tortuous esophagus.                           - Salmon-colored mucosa suggestive of Barrett's                            esophagus distally. Biopsied.                           - 9 cm hiatal hernia.                           - Erythematous mucosa in the stomach. Biopsied.                           - No gross lesions in the duodenal bulb, in the                            first portion of the duodenum and in the second                             portion of the duodenum. Recommendation:           - Proceed to scheduled colonoscopy.                           - Observe patient's clinical course.                           - Continue present medications.                           - Follow an antireflux regimen.                           - Await pathology results.                           - Repeat upper endoscopy for surveillance based on                            pathology results.                           - Pending patient's overall symptoms could consider                            the role of hiatal hernia repair but would                            recommend manometry prior to this depending on                            maximization of acid suppression if necessary.                           -  The findings and recommendations were discussed                            with the patient.                           - The findings and recommendations were discussed                            with the patient's family. Procedure Code(s):        --- Professional ---                           [phone], Esophagogastroduodenoscopy, flexible,                            transoral; with biopsy, single or multiple Diagnosis Code(s):        --- Professional ---                           Q39.9, Congenital malformation of esophagus,                            unspecified                           K22.8, Other specified diseases of esophagus                           K44.9, Diaphragmatic hernia without obstruction or                            gangrene                           K31.89, Other diseases of stomach and duodenum                           R12, Heartburn                           K21.9, Gastro-esophageal reflux disease without                            esophagitis                           R68.81, Early satiety CPT copyright 2019 American [employer] Association. All rights reserved. The codes documented in this report are  preliminary and upon coder review may  be revised to meet current compliance requirements. Justice Britain, MD 01/12/2021 8:37:51 AM Number of Addenda: 0

## 2021-01-12 NOTE — Op Note (Signed)
[facility] Patient Name: Eileen Morgan Procedure Date : 01/12/2021 MRN: [id number] Attending MD: Justice Britain , MD Date of Birth: May 19, 1950 CSN: [id number] Age: 70 Admit Type: Inpatient Procedure:                Colonoscopy Indications:              Screening for colorectal malignant neoplasm Providers:                Justice Britain, MD, Burtis Junes, RN, Tyna Jaksch Technician Referring MD:             Asencion Partridge. Gwenlyn Found Dorathy Daft Medicines:                Monitored Anesthesia Care Complications:            No immediate complications. Estimated Blood Loss:     Estimated blood loss was minimal. Procedure:                Pre-Anesthesia Assessment:                           - Prior to the procedure, a History and Physical                            was performed, and patient medications and                            allergies were reviewed. The patient's tolerance of                            previous anesthesia was also reviewed. The risks                            and benefits of the procedure and the sedation                            options and risks were discussed with the patient.                            All questions were answered, and informed consent                            was obtained. Prior Anticoagulants: The patient has                            taken no previous anticoagulant or antiplatelet                            agents except for NSAID medication. ASA Grade                            Assessment: III - A patient with severe systemic  disease. After reviewing the risks and benefits,                            the patient was deemed in satisfactory condition to                            undergo the procedure.                           After obtaining informed consent, the colonoscope                            was passed under direct vision. Throughout the                             procedure, the patient's blood pressure, pulse, and                            oxygen saturations were monitored continuously. The                            [id number] ([id number]) Olympus colonoscope was                            introduced through the anus and advanced to the 8                            cm into the ileum. The colonoscopy was somewhat                            difficult due to a redundant colon. Successful                            completion of the procedure was aided by changing                            the patient's position, using manual pressure,                            straightening and shortening the scope to obtain                            bowel loop reduction and using scope torsion. The                            patient tolerated the procedure. The quality of the                            bowel preparation was good. The terminal ileum,                            ileocecal valve, appendiceal orifice, and rectum  were photographed. Scope In: 8:01:49 AM Scope Out: 8:21:28 AM Scope Withdrawal Time: 0 hours 12 minutes 53 seconds  Total Procedure Duration: 0 hours 19 minutes 39 seconds  Findings:      The digital rectal exam findings include hemorrhoids. Pertinent       negatives include no palpable rectal lesions.      The terminal ileum contained multiple scattered non-bleeding erosions.       Biopsies were taken with a cold forceps for histology to rule out       chronic ileitis.      Four sessile polyps were found in the recto-sigmoid colon (3) and       descending colon (1). The polyps were 2 to 6 mm in size. These polyps       were removed with a cold snare. Resection and retrieval were complete.      A few small-mouthed diverticula were found in the recto-sigmoid colon,       sigmoid colon and descending colon.      Normal mucosa was found in the entire colon otherwise.      Non-bleeding non-thrombosed  external and internal hemorrhoids were found       during retroflexion, during perianal exam and during digital exam. The       hemorrhoids were Grade II (internal hemorrhoids that prolapse but reduce       spontaneously). Impression:               - Hemorrhoids found on digital rectal exam.                           - Multiple erosions in the terminal ileum. Biopsied.                           - Four, 2 to 6 mm polyps at the recto-sigmoid colon                            and in the descending colon, removed with a cold                            snare. Resected and retrieved.                           - Diverticulosis in the recto-sigmoid colon, in the                            sigmoid colon and in the descending colon.                           - Normal mucosa in the entire examined colon                            otherwise.                           - Non-bleeding non-thrombosed external and internal                            hemorrhoids. Recommendation:           -  The patient will be observed post-procedure,                            until all discharge criteria are met.                           - Discharge patient to home.                           - Patient has a contact number available for                            emergencies. The signs and symptoms of potential                            delayed complications were discussed with the                            patient. Return to normal activities tomorrow.                            Written discharge instructions were provided to the                            patient.                           - High fiber diet.                           - Use FiberCon 1-2 tablets PO daily.                           - Continue present medications.                           - Minimize NSAIDs as able as they could contribute                            to erosions in TI.                           - Await pathology results.                            - Repeat colonoscopy in 3/09/10/08 years for                            surveillance based on pathology results and                            findings of adenomatous tissue.                           - The findings and recommendations were discussed  with the patient.                           - The findings and recommendations were discussed                            with the patient's family. Procedure Code(s):        --- Professional ---                           [phone], Colonoscopy, flexible; with removal of                            tumor(s), polyp(s), or other lesion(s) by snare                            technique                           45380, 69, Colonoscopy, flexible; with biopsy,                            single or multiple Diagnosis Code(s):        --- Professional ---                           Z12.11, Encounter for screening for malignant                            neoplasm of colon                           K64.1, Second degree hemorrhoids                           K63.3, Ulcer of intestine                           K63.5, Polyp of colon                           K57.30, Diverticulosis of large intestine without                            perforation or abscess without bleeding CPT copyright 2019 American [employer] Association. All rights reserved. The codes documented in this report are preliminary and upon coder review may  be revised to meet current compliance requirements. Justice Britain, MD 01/12/2021 8:43:09 AM Number of Addenda: 0

## 2021-01-13 ENCOUNTER — Encounter (HOSPITAL_COMMUNITY): Payer: Self-pay | Admitting: Gastroenterology

## 2021-01-13 ENCOUNTER — Ambulatory Visit
Admission: RE | Admit: 2021-01-13 | Discharge: 2021-01-13 | Disposition: A | Discharge status: Home / Self Care | Payer: Medicare Other | Source: Ambulatory Visit | Attending: Family Medicine | Admitting: Family Medicine

## 2021-01-13 ENCOUNTER — Other Ambulatory Visit: Payer: Self-pay | Admitting: Family Medicine

## 2021-01-13 DIAGNOSIS — N6489 Other specified disorders of breast: Secondary | ICD-10-CM

## 2021-01-13 DIAGNOSIS — R928 Other abnormal and inconclusive findings on diagnostic imaging of breast: Secondary | ICD-10-CM

## 2021-01-13 LAB — SURGICAL PATHOLOGY

## 2021-01-16 ENCOUNTER — Encounter: Payer: Self-pay | Admitting: Gastroenterology

## 2021-01-23 ENCOUNTER — Telehealth: Payer: Self-pay

## 2021-01-23 NOTE — Telephone Encounter (Signed)
Patient called to schedule medicare wellness visit, no answer, no voicemail set up.

## 2021-01-30 ENCOUNTER — Other Ambulatory Visit: Payer: Self-pay

## 2021-01-30 ENCOUNTER — Ambulatory Visit (INDEPENDENT_AMBULATORY_CARE_PROVIDER_SITE_OTHER): Payer: Medicare Other

## 2021-01-30 DIAGNOSIS — Z0000 Encounter for general adult medical examination without abnormal findings: Secondary | ICD-10-CM | POA: Diagnosis not present

## 2021-01-30 NOTE — Progress Notes (Signed)
Subjective:   Eileen Morgan is a 70 y.o. female who presents for an Initial Medicare Annual Wellness Visit.  I connected with  [name] on 01/30/21 by a audio enabled telemedicine application and verified that I am speaking with the correct person using two identifiers.   I discussed the limitations of evaluation and management by telemedicine. The patient expressed understanding and agreed to proceed.   Location of Patient: Home Location of Provider: Office   List any persons and their roles that are participating in the visit with patient.  [place]  Review of Systems    Defer to PCP       Objective:    There were no vitals filed for this visit. There is no height or weight on file to calculate BMI.  Advanced Directives 01/12/2021 03/19/2019  Does Patient Have a Medical Advance Directive? No No  Would patient like information on creating a medical advance directive? No - Patient declined No - Patient declined    Current Medications (verified) Outpatient Encounter Medications as of 01/30/2021  Medication Sig   buPROPion (WELLBUTRIN SR) 200 MG 12 hr tablet TAKE 1 TABLET BY MOUTH TWICE A DAY (Patient taking differently: Take 200 mg by mouth 2 (two) times daily.)   hydrochlorothiazide (HYDRODIURIL) 12.5 MG tablet Take 1 tablet (12.5 mg total) by mouth daily.   levETIRAcetam (KEPPRA) 500 MG tablet Take 500 mg by mouth 2 (two) times daily.   meloxicam (MOBIC) 15 MG tablet TAKE 1 TABLET BY MOUTH EVERY DAY (Patient taking differently: Take 15 mg by mouth daily.)   omeprazole (PRILOSEC) 20 MG capsule TAKE 1 CAPSULE BY MOUTH EVERY DAY (Patient taking differently: Take 20 mg by mouth daily. TAKE 1 CAPSULE BY MOUTH EVERY DAY)   oxybutynin (DITROPAN-XL) 10 MG 24 hr tablet TAKE 1 TABLET BY MOUTH EVERYDAY AT BEDTIME   OXYGEN Inhale 2 L into the lungs daily as needed (oxygen).   No facility-administered encounter medications on file as of 01/30/2021.    Allergies  (verified) Penicillins   History: Past Medical History:  Diagnosis Date   COPD (chronic obstructive pulmonary disease) ([place])    Pulmonary embolism (HCC)    Seizures ([place])    Past Surgical History:  Procedure Laterality Date   BIOPSY  01/12/2021   Procedure: BIOPSY;  Surgeon: Rush Landmark Telford Nab., MD;  Location: [place];  Service: Gastroenterology;;  EGD and COLON   COLONOSCOPY WITH PROPOFOL N/A 01/12/2021   Procedure: COLONOSCOPY WITH PROPOFOL;  Surgeon: Irving Copas., MD;  Location: [place];  Service: Gastroenterology;  Laterality: N/A;   ESOPHAGOGASTRODUODENOSCOPY (EGD) WITH PROPOFOL N/A 01/12/2021   Procedure: ESOPHAGOGASTRODUODENOSCOPY (EGD) WITH PROPOFOL;  Surgeon: Rush Landmark Telford Nab., MD;  Location: [place];  Service: Gastroenterology;  Laterality: N/A;   POLYPECTOMY  01/12/2021   Procedure: POLYPECTOMY;  Surgeon: Mansouraty, Telford Nab., MD;  Location: [facility] ENDOSCOPY;  Service: Gastroenterology;;   TONSILLECTOMY     TUBAL LIGATION     Family History  Problem Relation Age of Onset   COPD Father    Emphysema Brother    CAD Neg Hx    Diabetes Mellitus II Neg Hx    Social History   Socioeconomic History   Marital status: Single    Spouse name: Not on file   Number of children: Not on file   Years of education: Not on file   Highest education level: Not on file  Occupational History   Not on file  Tobacco Use   Smoking status: Former  Packs/day: 1.00    Years: 40.00    Pack years: 40.00    Types: Cigarettes    Quit date: 03/08/2019    Years since quitting: 1.9   Smokeless tobacco: Never  Vaping Use   Vaping Use: Never used  Substance and Sexual Activity   Alcohol use: Not Currently   Drug use: Never   Sexual activity: Not on file  Other Topics Concern   Not on file  Social History Narrative   Not on file   Social Determinants of Health   Financial Resource Strain: Not on file  Food Insecurity: Not on file  Transportation Needs:  Not on file  Physical Activity: Not on file  Stress: Not on file  Social Connections: Not on file    Tobacco Counseling Counseling given: Non-Smoker  Clinical Intake:  Pre-Preparation completed: Yes Pain: No/denies pain       Nutritional Risks: None Diabetic? No   How often do you need to have someone help instructions, pamphlets, or other written materials from the pharmacy? Never      Activities of Daily Living No flowsheet data found.  Patient Care Team: Dorena Dew, FNP as PCP - General (Family Medicine)  Indicate any recent Medical Services you may have received from other than [facility] providers in the past year (date may be approximate).     Assessment:   This is a routine wellness examination for [name].  Hearing/Vision screen No results found.  Dietary issues and exercise activities discussed:     Goals Addressed   None    Depression Screen PHQ 2/9 Scores 12/22/2020 05/17/2020 11/17/2019 07/14/2019 07/14/2019 06/16/2019  PHQ - 2 Score 0 0 1 0 1 1  PHQ- 9 Score - - 5 - - -    Fall Risk Fall Risk  12/22/2020 07/14/2019 06/16/2019  Falls in the past year? 0 0 0  Number falls in past yr: 0 - -  Injury with Fall? 0 - -    FALL RISK PREVENTION PERTAINING TO THE HOME:  Any stairs in or around the home? Yes  If so, are there any without handrails? Yes  Home free of loose throw rugs in walkways, pet beds, electrical cords, etc? Yes  Adequate lighting in your home to reduce risk of falls? Yes   ASSISTIVE DEVICES UTILIZED TO PREVENT FALLS:  Life alert? No  Use of a cane, walker or w/c? No  Grab bars in the bathroom? No  Shower chair or bench in shower? No  Elevated toilet seat or a handicapped toilet? Yes   TIMED UP AND GO:  Was the test performed?  No .  Length of time to ambulate 10 feet:   sec.   Gait steady and fast without use of assistive device  Cognitive Function:        Immunizations Immunization History  Administered Date(s)  Administered   Influenza,inj,Quad PF,6+ Mos 04/23/2020   PFIZER(Purple Top)SARS-COV-2 Vaccination 04/23/2020, 05/11/2020   Pneumococcal Conjugate-13 06/21/2020    TDAP status: Up to date  Flu Vaccine status: Up to date  Pneumococcal vaccine status: Up to date  Covid-19 vaccine status: Completed vaccines  Qualifies for Shingles Vaccine? Yes   Zostavax completed Yes   Shingrix Completed?: No.    Education has been provided regarding the importance of this vaccine. Patient has been advised to call insurance company to determine out of pocket expense if they have not yet received this vaccine. Advised may also receive vaccine at local pharmacy or Health Dept.  Verbalized acceptance and understanding.  Screening Tests Health Maintenance  Topic Date Due   Hepatitis C Screening  Never done   TETANUS/TDAP  Never done   Zoster Vaccines- Shingrix (1 of 2) Never done   DEXA SCAN  Never done   COVID-19 Vaccine (3 - Booster for Pfizer series) 10/09/2020   INFLUENZA VACCINE  12/05/2020   MAMMOGRAM  12/27/2022   COLONOSCOPY (Pts 45-22yrs Insurance coverage will need to be confirmed)  01/13/2031   HPV VACCINES  Aged Out    Health Maintenance  Health Maintenance Due  Topic Date Due   Hepatitis C Screening  Never done   TETANUS/TDAP  Never done   Zoster Vaccines- Shingrix (1 of 2) Never done   DEXA SCAN  Never done   COVID-19 Vaccine (3 - Booster for Pfizer series) 10/09/2020   INFLUENZA VACCINE  12/05/2020    Colorectal cancer screening: Type of screening: Colonoscopy. Completed 01/23/2021. Repeat every 10 years  Mammogram status: Completed 01/13/2021. Repeat every year  Bone Density status: Ordered Message sent to the provider. Pt provided with contact info and advised to call to schedule appt.  Lung Cancer Screening: (Low Dose CT Chest recommended if Age 71-80 years, 30 pack-year currently smoking OR have quit w/in 15years.) does qualify.   Lung Cancer Screening Referral: Message  sent to provider  Additional Screening:  Hepatitis C Screening: does qualify; Completed   Vision Screening: Recommended annual ophthalmology exams for early detection of glaucoma and other disorders of the eye. Is the patient up to date with their annual eye exam?  No  Who is the provider or what is the name of the office in which the patient attends annual eye exams? No If pt is not established with a provider, would they like to be referred to a provider to establish care? No .   Dental Screening: Recommended annual dental exams for proper oral hygiene  Community Resource Referral / Chronic Care Management: CRR required this visit?  Yes   CCM required this visit?  Yes      Plan:     I have personally reviewed and noted the following in the patient's chart:   Medical and social history Use of alcohol, tobacco or illicit drugs  Current medications and supplements including opioid prescriptions. Patient is not currently taking opioid prescriptions. Functional ability and status Nutritional status Physical activity Advanced directives List of other physicians Hospitalizations, surgeries, and ER visits in previous 12 months Vitals Screenings to include cognitive, depression, and falls Referrals and appointments  In addition, I have reviewed and discussed with patient certain preventive protocols, quality metrics, and best practice recommendations. A written personalized care plan for preventive services as well as general preventive health recommendations were provided to patient.     Sinclaire Artiga Jeanella Anton, [place]   01/30/2021   Nurse Notes: Non- Face to Face  25 minutes  Ms. Kellie Simmering , Thank you for taking time to come for your Medicare Wellness Visit. I appreciate your ongoing commitment to your health goals. Please review the following plan we discussed and let me know if I can assist you in the future.   These are the goals we discussed:  Goals   None     This is a list  of the screening recommended for you and due dates:  Health Maintenance  Topic Date Due   Hepatitis C Screening: USPSTF Recommendation to screen - Ages 65-79 yo.  Never done   Tetanus Vaccine  Never done  Zoster (Shingles) Vaccine (1 of 2) Never done   DEXA scan (bone density measurement)  Never done   COVID-19 Vaccine (3 - Booster for Pfizer series) 10/09/2020   Flu Shot  12/05/2020   Mammogram  12/27/2022   Colon Cancer Screening  01/13/2031   HPV Vaccine  Aged Out

## 2021-02-04 ENCOUNTER — Other Ambulatory Visit: Payer: Self-pay | Admitting: Family Medicine

## 2021-02-04 DIAGNOSIS — N3281 Overactive bladder: Secondary | ICD-10-CM

## 2021-02-04 DIAGNOSIS — N3289 Other specified disorders of bladder: Secondary | ICD-10-CM

## 2021-02-14 ENCOUNTER — Ambulatory Visit: Payer: Medicare Other | Admitting: Family Medicine

## 2021-02-14 ENCOUNTER — Other Ambulatory Visit: Payer: Self-pay

## 2021-02-14 VITALS — BP 148/64 | HR 65

## 2021-02-14 DIAGNOSIS — I10 Essential (primary) hypertension: Secondary | ICD-10-CM

## 2021-02-14 NOTE — Progress Notes (Signed)
Patient in for 1 month blood pressure check.

## 2021-03-02 ENCOUNTER — Other Ambulatory Visit: Payer: Self-pay | Admitting: Family Medicine

## 2021-03-02 DIAGNOSIS — N3289 Other specified disorders of bladder: Secondary | ICD-10-CM

## 2021-03-02 DIAGNOSIS — N3281 Overactive bladder: Secondary | ICD-10-CM

## 2021-03-28 ENCOUNTER — Other Ambulatory Visit: Payer: Self-pay

## 2021-03-28 ENCOUNTER — Encounter: Payer: Self-pay | Admitting: Family Medicine

## 2021-03-28 ENCOUNTER — Ambulatory Visit (INDEPENDENT_AMBULATORY_CARE_PROVIDER_SITE_OTHER): Payer: Medicare Other | Admitting: Family Medicine

## 2021-03-28 VITALS — BP 139/75 | HR 60 | Temp 97.8°F | Wt 206.4 lb

## 2021-03-28 DIAGNOSIS — I10 Essential (primary) hypertension: Secondary | ICD-10-CM

## 2021-03-28 DIAGNOSIS — Z1159 Encounter for screening for other viral diseases: Secondary | ICD-10-CM | POA: Diagnosis not present

## 2021-03-28 DIAGNOSIS — Z23 Encounter for immunization: Secondary | ICD-10-CM

## 2021-03-28 DIAGNOSIS — J441 Chronic obstructive pulmonary disease with (acute) exacerbation: Secondary | ICD-10-CM | POA: Diagnosis not present

## 2021-03-28 DIAGNOSIS — R569 Unspecified convulsions: Secondary | ICD-10-CM

## 2021-03-28 LAB — POCT URINALYSIS DIP (CLINITEK)
Bilirubin, UA: NEGATIVE
Blood, UA: NEGATIVE
Glucose, UA: NEGATIVE mg/dL
Ketones, POC UA: NEGATIVE mg/dL
Leukocytes, UA: NEGATIVE
Nitrite, UA: NEGATIVE
POC PROTEIN,UA: NEGATIVE
Spec Grav, UA: 1.015 (ref 1.010–1.025)
Urobilinogen, UA: 0.2 E.U./dL
pH, UA: 6 (ref 5.0–8.0)

## 2021-03-28 MED ORDER — HYDROCHLOROTHIAZIDE 25 MG PO TABS: 25.0000 mg | ORAL_TABLET | Freq: Every day | ORAL | 1 refills | Status: DC

## 2021-03-28 NOTE — Progress Notes (Signed)
[name]   Established Patient Office Visit  Subjective:  Patient ID: Eileen Morgan, female    DOB: Nov 10, 1950  Age: 70 y.o. MRN: [id number]  CC:  Chief Complaint  Patient presents with   Follow-up    3 month follow-up pt states she has numbness on the left leg for the pass 3 months    HPI Eileen Morgan is a 70 year old female with a medical history significant for essential hypertension, COPD, seizure disorder, and history of pulmonary embolism completed anticoagulation presents for follow-up of chronic condition.  Patient states that she has been doing well and is without complaint.  Patient has been following a low-fat diet.  She does not exercise due to chronic knee pain, however she remains very active. Patient has a history of hypertension.  She does not check her blood pressure at home.  She does have a family history of hypertension and hyperlipidemia and heart disease.  She currently denies chest pain, lower extremity swelling, urinary symptoms, nausea, vomiting, or diarrhea.  Past Medical History:  Diagnosis Date   COPD (chronic obstructive pulmonary disease) ([place])    Pulmonary embolism (HCC)    Seizures ([place])     Past Surgical History:  Procedure Laterality Date   BIOPSY  01/12/2021   Procedure: BIOPSY;  Surgeon: Rush Landmark Telford Nab., MD;  Location: [place];  Service: Gastroenterology;;  EGD and COLON   COLONOSCOPY WITH PROPOFOL N/A 01/12/2021   Procedure: COLONOSCOPY WITH PROPOFOL;  Surgeon: Irving Copas., MD;  Location: [place];  Service: Gastroenterology;  Laterality: N/A;   ESOPHAGOGASTRODUODENOSCOPY (EGD) WITH PROPOFOL N/A 01/12/2021   Procedure: ESOPHAGOGASTRODUODENOSCOPY (EGD) WITH PROPOFOL;  Surgeon: Rush Landmark Telford Nab., MD;  Location: [place];  Service: Gastroenterology;  Laterality: N/A;   POLYPECTOMY  01/12/2021   Procedure: POLYPECTOMY;  Surgeon: Mansouraty, Telford Nab., MD;  Location: [facility]  ENDOSCOPY;  Service: Gastroenterology;;   TONSILLECTOMY     TUBAL LIGATION      Family History  Problem Relation Age of Onset   COPD Father    Emphysema Brother    CAD Neg Hx    Diabetes Mellitus II Neg Hx     Social History   Socioeconomic History   Marital status: Single    Spouse name: Not on file   Number of children: Not on file   Years of education: Not on file   Highest education level: Not on file  Occupational History   Not on file  Tobacco Use   Smoking status: Former    Packs/day: 1.00    Years: 40.00    Pack years: 40.00    Types: Cigarettes    Quit date: 03/08/2019    Years since quitting: 2.0   Smokeless tobacco: Never  Vaping Use   Vaping Use: Never used  Substance and Sexual Activity   Alcohol use: Not Currently   Drug use: Never   Sexual activity: Not on file  Other Topics Concern   Not on file  Social History Narrative   Not on file   Social Determinants of Health   Financial Resource Strain: Not on file  Food Insecurity: Not on file  Transportation Needs: Not on file  Physical Activity: Not on file  Stress: Not on file  Social Connections: Not on file  Intimate Partner Violence: Not on file    Outpatient Medications Prior to Visit  Medication Sig Dispense Refill   buPROPion (WELLBUTRIN SR) 200 MG 12 hr tablet TAKE 1 TABLET  BY MOUTH TWICE A DAY (Patient taking differently: Take 200 mg by mouth 2 (two) times daily.) 180 tablet 2   hydrochlorothiazide (HYDRODIURIL) 12.5 MG tablet Take 1 tablet (12.5 mg total) by mouth daily. 90 tablet 3   levETIRAcetam (KEPPRA) 500 MG tablet Take 500 mg by mouth 2 (two) times daily.     meloxicam (MOBIC) 15 MG tablet TAKE 1 TABLET BY MOUTH EVERY DAY (Patient taking differently: Take 15 mg by mouth daily.) 30 tablet 5   omeprazole (PRILOSEC) 20 MG capsule TAKE 1 CAPSULE BY MOUTH EVERY DAY (Patient taking differently: Take 20 mg by mouth daily. TAKE 1 CAPSULE BY MOUTH EVERY DAY) 90 capsule 1   oxybutynin  (DITROPAN-XL) 10 MG 24 hr tablet TAKE 1 TABLET BY MOUTH EVERYDAY AT BEDTIME 30 tablet 1   OXYGEN Inhale 2 L into the lungs daily as needed (oxygen).     No facility-administered medications prior to visit.   Immunization History  Administered Date(s) Administered   Influenza,inj,Quad PF,6+ Mos 04/23/2020   PFIZER(Purple Top)SARS-COV-2 Vaccination 04/23/2020, 05/11/2020   Pneumococcal Conjugate-13 06/21/2020    Allergies  Allergen Reactions   Penicillins Anaphylaxis    ROS Review of Systems  Constitutional: Negative.  Negative for activity change and appetite change.  HENT: Negative.    Respiratory: Negative.    Cardiovascular: Negative.   Gastrointestinal: Negative.   Endocrine: Negative for polydipsia, polyphagia and polyuria.  Genitourinary: Negative.   Musculoskeletal: Negative.   Skin: Negative.   Neurological: Negative.   Psychiatric/Behavioral: Negative.       Objective:    Physical Exam  BP 139/75   Pulse 60   Temp 97.8 F (36.6 C)   Wt 206 lb 6 oz (93.6 kg)   SpO2 100%   BMI 35.42 kg/m  Wt Readings from Last 3 Encounters:  03/28/21 206 lb 6 oz (93.6 kg)  01/12/21 200 lb (90.7 kg)  12/22/20 205 lb 0.6 oz (93 kg)     Health Maintenance Due  Topic Date Due   Hepatitis C Screening  Never done   TETANUS/TDAP  Never done   Zoster Vaccines- Shingrix (1 of 2) Never done   DEXA SCAN  Never done   COVID-19 Vaccine (3 - Booster for Pfizer series) 07/06/2020   INFLUENZA VACCINE  12/05/2020   Pneumonia Vaccine 42+ Years old (2 - PPSV23 if available, else PCV20) 06/21/2021    There are no preventive care reminders to display for this patient.  Lab Results  Component Value Date   TSH 3.540 12/22/2020   Lab Results  Component Value Date   WBC 6.2 06/16/2019   HGB 13.5 06/16/2019   HCT 41.9 06/16/2019   MCV 92 06/16/2019   PLT 240 06/16/2019   Lab Results  Component Value Date   NA 141 12/22/2020   K 4.7 12/22/2020   CO2 25 12/22/2020   GLUCOSE  93 12/22/2020   BUN 17 12/22/2020   CREATININE 0.87 12/22/2020   BILITOT 0.4 12/22/2020   ALKPHOS 74 12/22/2020   AST 18 12/22/2020   ALT 21 12/22/2020   PROT 6.1 12/22/2020   ALBUMIN 4.3 12/22/2020   CALCIUM 9.2 12/22/2020   ANIONGAP 9 03/21/2019   EGFR 71 12/22/2020   Lab Results  Component Value Date   CHOL 177 12/22/2020   Lab Results  Component Value Date   HDL 63 12/22/2020   Lab Results  Component Value Date   LDLCALC 87 12/22/2020   Lab Results  Component Value Date   TRIG  161 (H) 12/22/2020   Lab Results  Component Value Date   CHOLHDL 2.8 12/22/2020   Lab Results  Component Value Date   HGBA1C 5.5 12/22/2020      Assessment & Plan:   Problem List Items Addressed This Visit   None Visit Diagnoses     Essential hypertension    -  Primary   Relevant Orders   POCT URINALYSIS DIP (CLINITEK)     1. Essential hypertension BP 139/75   Pulse 60   Temp 97.8 F (36.6 C)   Wt 206 lb 6 oz (93.6 kg)   SpO2 100%   BMI 35.42 kg/m  - Continue medication, monitor blood pressure at home. Continue DASH diet.  Reminder to go to the ER if any CP, SOB, nausea, dizziness, severe HA, changes vision/speech, left arm numbness and tingling and jaw pain.   - POCT URINALYSIS DIP (CLINITEK) - hydrochlorothiazide (HYDRODIURIL) 25 MG tablet; Take 1 tablet (25 mg total) by mouth daily.  Dispense: 90 tablet; Refill: 1 - CMP and Liver  2. Need for hepatitis C screening test  - Hepatitis C Antibody  3. Flu vaccine need  - Flu Vaccine QUAD 36+ mos IM (Fluarix, Fluzone & Afluria Quad PF  4. Need for Tdap vaccination  - Tdap vaccine greater than or equal to 7yo IM  5. COPD with acute exacerbation [facility]) Follow-up with pulmonology as scheduled, no medication changes warranted on today.  6. Seizure ([place]) Well-controlled.  No medication changes warranted.  No orders of the defined types were placed in this encounter.   Follow-up: 6 months for chronic  conditions [place]  APRN, MSN, FNP-C Patient [place] [address] [place], [place] [phone]

## 2021-03-29 LAB — CMP AND LIVER
ALT: 23 IU/L (ref 0–32)
AST: 19 IU/L (ref 0–40)
Albumin: 4.3 g/dL (ref 3.7–4.7)
Alkaline Phosphatase: 72 IU/L (ref 44–121)
BUN: 21 mg/dL (ref 8–27)
Bilirubin Total: 0.3 mg/dL (ref 0.0–1.2)
Bilirubin, Direct: <0.1 mg/dL (ref 0.00–0.40)
CO2: 27 mmol/L (ref 20–29)
Calcium: 9.6 mg/dL (ref 8.7–10.3)
Chloride: 99 mmol/L (ref 96–106)
Creatinine, Ser: 0.86 mg/dL (ref 0.57–1.00)
Glucose: 80 mg/dL (ref 70–99)
Potassium: 4.5 mmol/L (ref 3.5–5.2)
Sodium: 139 mmol/L (ref 134–144)
Total Protein: 6.3 g/dL (ref 6.0–8.5)
eGFR: 72 mL/min/{1.73_m2} (ref >59)

## 2021-03-29 LAB — HEPATITIS C ANTIBODY: Hep C Virus Ab: 0.2 s/co ratio (ref 0.0–0.9)

## 2021-05-05 ENCOUNTER — Other Ambulatory Visit: Payer: Self-pay | Admitting: Family Medicine

## 2021-05-05 DIAGNOSIS — K219 Gastro-esophageal reflux disease without esophagitis: Secondary | ICD-10-CM

## 2021-06-27 ENCOUNTER — Other Ambulatory Visit: Payer: Self-pay | Admitting: Family Medicine

## 2021-06-27 DIAGNOSIS — Z8669 Personal history of other diseases of the nervous system and sense organs: Secondary | ICD-10-CM

## 2021-07-02 ENCOUNTER — Other Ambulatory Visit: Payer: Self-pay | Admitting: Family Medicine

## 2021-07-02 DIAGNOSIS — M25561 Pain in right knee: Secondary | ICD-10-CM

## 2021-07-04 ENCOUNTER — Ambulatory Visit: Payer: Medicare Other | Admitting: Family Medicine

## 2021-07-14 ENCOUNTER — Other Ambulatory Visit: Payer: Self-pay | Admitting: Family Medicine

## 2021-07-14 ENCOUNTER — Ambulatory Visit
Admission: RE | Admit: 2021-07-14 | Discharge: 2021-07-14 | Disposition: A | Discharge status: Home / Self Care | Payer: Medicare Other | Source: Ambulatory Visit | Attending: Family Medicine | Admitting: Family Medicine

## 2021-07-14 DIAGNOSIS — N6489 Other specified disorders of breast: Secondary | ICD-10-CM

## 2021-07-14 DIAGNOSIS — R922 Inconclusive mammogram: Secondary | ICD-10-CM | POA: Diagnosis not present

## 2021-08-01 DIAGNOSIS — Z20822 Contact with and (suspected) exposure to covid-19: Secondary | ICD-10-CM | POA: Diagnosis not present

## 2021-08-01 DIAGNOSIS — R0602 Shortness of breath: Secondary | ICD-10-CM | POA: Diagnosis not present

## 2021-08-02 DIAGNOSIS — Z20822 Contact with and (suspected) exposure to covid-19: Secondary | ICD-10-CM | POA: Diagnosis not present

## 2021-08-03 ENCOUNTER — Encounter: Payer: Self-pay | Admitting: Family Medicine

## 2021-08-03 ENCOUNTER — Ambulatory Visit (INDEPENDENT_AMBULATORY_CARE_PROVIDER_SITE_OTHER): Payer: Medicare Other | Admitting: Family Medicine

## 2021-08-03 VITALS — BP 132/79 | HR 77 | Temp 97.3°F | Ht 64.0 in | Wt 194.2 lb

## 2021-08-03 DIAGNOSIS — I10 Essential (primary) hypertension: Secondary | ICD-10-CM

## 2021-08-03 DIAGNOSIS — M25561 Pain in right knee: Secondary | ICD-10-CM

## 2021-08-03 DIAGNOSIS — J441 Chronic obstructive pulmonary disease with (acute) exacerbation: Secondary | ICD-10-CM | POA: Diagnosis not present

## 2021-08-03 DIAGNOSIS — N3281 Overactive bladder: Secondary | ICD-10-CM | POA: Diagnosis not present

## 2021-08-03 DIAGNOSIS — N898 Other specified noninflammatory disorders of vagina: Secondary | ICD-10-CM

## 2021-08-03 DIAGNOSIS — R399 Unspecified symptoms and signs involving the genitourinary system: Secondary | ICD-10-CM

## 2021-08-03 DIAGNOSIS — Z8669 Personal history of other diseases of the nervous system and sense organs: Secondary | ICD-10-CM

## 2021-08-03 DIAGNOSIS — N3289 Other specified disorders of bladder: Secondary | ICD-10-CM

## 2021-08-03 LAB — POCT URINALYSIS DIP (CLINITEK)
Blood, UA: NEGATIVE
Glucose, UA: NEGATIVE mg/dL
Ketones, POC UA: NEGATIVE mg/dL
Leukocytes, UA: NEGATIVE
Nitrite, UA: NEGATIVE
POC PROTEIN,UA: 30 — AB
Spec Grav, UA: >=1.03 — AB (ref 1.010–1.025)
Urobilinogen, UA: 0.2 E.U./dL
pH, UA: 6 (ref 5.0–8.0)

## 2021-08-03 MED ORDER — MELOXICAM 15 MG PO TABS: 15.0000 mg | ORAL_TABLET | Freq: Every day | ORAL | 0 refills | Status: AC

## 2021-08-03 MED ORDER — OXYBUTYNIN CHLORIDE ER 10 MG PO TB24: 10.0000 mg | ORAL_TABLET | Freq: Every day | ORAL | 2 refills | Status: DC

## 2021-08-03 NOTE — Progress Notes (Signed)
? ? ?Patient Eileen Morgan ?[facility] ? ?Established Patient Office Visit ? ?Subjective:  ?Patient ID: Eileen Morgan, female    DOB: [birth year]  Age: 71 y.o. MRN: [id number] ? ?CC:  ?Chief Complaint  ?Patient presents with  ? Follow-up  ?  Pt is here for 3 month follow up. Pt stated she has vaginal irritation and pressure when urinating   ? ?[name] is a very pleasant 71 year old female with a medical history significant for hypertension, COPD, history of pulmonary embolism, and history of seizure disorder presents for follow-up of her chronic conditions.  Also, patient has a primary complaint of vaginal irritation and lower abdominal pressure over the past several days. ?Patient states that she has had some discomfort with urination and some vaginal soreness.  She does not have any abnormal vaginal discharge.  She is not sexually active.  No vaginal bleeding. ?Patient has a history of hypertension.  She has not been taking hydrochlorothiazide consistently.  She does not check her blood pressure at home.  She checks it periodically at her pharmacy.  She denies any lower extremity swelling, chest pain, shortness of breath, heart palpitation, or orthopnea.  ? ?Patient has a history of seizure disorder. Patient has not experienced seizure in well over 2 years. She has been taking Keppra consistently. She is not under the care of neurology at this time.  ? ?Patient also has a history of COPD that has been well controlled. Patient has been lost to follow up with pulmonology ? ? ? ? ? ?Past Medical History:  ?Diagnosis Date  ? COPD (chronic obstructive pulmonary disease) ([place])   ? Pulmonary embolism ([place])   ? Seizures ([place])   ? ? ?Past Surgical History:  ?Procedure Laterality Date  ? BIOPSY  01/12/2021  ? Procedure: BIOPSY;  Surgeon: Irving Copas., MD;  Location: [facility];  Service: Gastroenterology;;  EGD and COLON  ? COLONOSCOPY WITH PROPOFOL N/A 01/12/2021  ? Procedure:  COLONOSCOPY WITH PROPOFOL;  Surgeon: Mansouraty, Telford Nab., MD;  Location: [facility];  Service: Gastroenterology;  Laterality: N/A;  ? ESOPHAGOGASTRODUODENOSCOPY (EGD) WITH PROPOFOL N/A 01/12/2021  ? Procedure: ESOPHAGOGASTRODUODENOSCOPY (EGD) WITH PROPOFOL;  Surgeon: Rush Landmark Telford Nab., MD;  Location: [facility];  Service: Gastroenterology;  Laterality: N/A;  ? POLYPECTOMY  01/12/2021  ? Procedure: POLYPECTOMY;  Surgeon: Mansouraty, Telford Nab., MD;  Location: [facility];  Service: Gastroenterology;;  ? TONSILLECTOMY    ? TUBAL LIGATION    ? ? ?Family History  ?Problem Relation Age of Onset  ? COPD Father   ? Emphysema Brother   ? CAD Neg Hx   ? Diabetes Mellitus II Neg Hx   ? ? ?Social History  ? ?Socioeconomic History  ? Marital status: Single  ?  Spouse name: Not on file  ? Number of children: Not on file  ? Years of education: Not on file  ? Highest education level: Not on file  ?Occupational History  ? Not on file  ?Tobacco Use  ? Smoking status: Former  ?  Packs/day: 1.00  ?  Years: 40.00  ?  Pack years: 40.00  ?  Types: Cigarettes  ?  Quit date: 03/08/2019  ?  Years since quitting: 2.4  ? Smokeless tobacco: Never  ?Vaping Use  ? Vaping Use: Never used  ?Substance and Sexual Activity  ? Alcohol use: Not Currently  ? Drug use: Never  ? Sexual activity: Not on file  ?Other Topics Concern  ? Not on file  ?  Social History Narrative  ? Not on file  ? ?Social Determinants of Health  ? ?Financial Resource Strain: Not on file  ?Food Insecurity: Not on file  ?Transportation Needs: Not on file  ?Physical Activity: Not on file  ?Stress: Not on file  ?Social Connections: Not on file  ?Intimate Partner Violence: Not on file  ? ? ?Outpatient Medications Prior to Visit  ?Medication Sig Dispense Refill  ? buPROPion (WELLBUTRIN SR) 200 MG 12 hr tablet TAKE 1 TABLET BY MOUTH TWICE A DAY (Patient taking differently: Take 200 mg by mouth 2 (two) times daily.) 180 tablet 2  ? hydrochlorothiazide (HYDRODIURIL) 25 MG tablet  Take 1 tablet (25 mg total) by mouth daily. 90 tablet 1  ? levETIRAcetam (KEPPRA) 500 MG tablet TAKE 1 TABLET BY MOUTH TWICE A DAY 180 tablet 1  ? meloxicam (MOBIC) 15 MG tablet Take 1 tablet (15 mg total) by mouth daily. 30 tablet 3  ? omeprazole (PRILOSEC) 20 MG capsule TAKE 1 CAPSULE BY MOUTH EVERY DAY 90 capsule 1  ? oxybutynin (DITROPAN-XL) 10 MG 24 hr tablet TAKE 1 TABLET BY MOUTH EVERYDAY AT BEDTIME 30 tablet 1  ? OXYGEN Inhale 2 L into the lungs daily as needed (oxygen).    ? ?No facility-administered medications prior to visit.  ? ? ?Allergies  ?Allergen Reactions  ? Penicillins Anaphylaxis  ? ?Immunization History  ?Administered Date(s) Administered  ? Influenza,inj,Quad PF,6+ Mos 04/23/2020, 03/28/2021  ? PFIZER(Purple Top)SARS-COV-2 Vaccination 04/23/2020, 05/11/2020  ? Pneumococcal Conjugate-13 06/21/2020  ? Tdap 03/28/2021  ? ? ?ROS ?Review of Systems  ?Constitutional: Negative.   ?HENT: Negative.  Negative for sinus pressure and sinus pain.   ?Respiratory: Negative.    ?Cardiovascular: Negative.   ?Gastrointestinal: Negative.   ?Genitourinary:  Positive for decreased urine volume and difficulty urinating. Negative for frequency and hematuria.  ?Hematological: Negative.   ?Psychiatric/Behavioral: Negative.    ? ?  ?Objective:  ?  ?Physical Exam ?Constitutional:   ?   Appearance: She is obese.  ?Eyes:  ?   Pupils: Pupils are equal, round, and reactive to light.  ?Cardiovascular:  ?   Rate and Rhythm: Normal rate and regular rhythm.  ?   Pulses: Normal pulses.  ?Pulmonary:  ?   Effort: Pulmonary effort is normal.  ?   Breath sounds: Normal breath sounds.  ?Abdominal:  ?   General: Abdomen is flat.  ?Skin: ?   General: Skin is warm.  ?Neurological:  ?   General: No focal deficit present.  ?   Mental Status: She is alert. Mental status is at baseline.  ?Psychiatric:     ?   Mood and Affect: Mood normal.     ?   Behavior: Behavior normal.     ?   Thought Content: Thought content normal.     ?   Judgment:  Judgment normal.  ? ? ?BP 132/79 (BP Location: Right Arm, Patient Position: Sitting)   Pulse 77   Temp (!) 97.3 ?F (36.3 ?C)   Ht '5\' 4"'  (1.626 m)   Wt 194 lb 3.2 oz (88.1 kg)   SpO2 93%   BMI 33.33 kg/m?  ?Wt Readings from Last 3 Encounters:  ?08/03/21 194 lb 3.2 oz (88.1 kg)  ?03/28/21 206 lb 6 oz (93.6 kg)  ?01/12/21 200 lb (90.7 kg)  ? ? ? ?Health Maintenance Due  ?Topic Date Due  ? Zoster Vaccines- Shingrix (1 of 2) Never done  ? DEXA SCAN  Never done  ? COVID-19  Vaccine (3 - Booster for Pfizer series) 07/06/2020  ? Pneumonia Vaccine 14+ Years old (2 - PPSV23 if available, else PCV20) 06/21/2021  ? ? ?There are no preventive care reminders to display for this patient. ? ?Lab Results  ?Component Value Date  ? TSH 3.540 12/22/2020  ? ?Lab Results  ?Component Value Date  ? WBC 6.2 06/16/2019  ? HGB 13.5 06/16/2019  ? HCT 41.9 06/16/2019  ? MCV 92 06/16/2019  ? PLT 240 06/16/2019  ? ?Lab Results  ?Component Value Date  ? NA 139 03/28/2021  ? K 4.5 03/28/2021  ? CO2 27 03/28/2021  ? GLUCOSE 80 03/28/2021  ? BUN 21 03/28/2021  ? CREATININE 0.86 03/28/2021  ? BILITOT 0.3 03/28/2021  ? ALKPHOS 72 03/28/2021  ? AST 19 03/28/2021  ? ALT 23 03/28/2021  ? PROT 6.3 03/28/2021  ? ALBUMIN 4.3 03/28/2021  ? CALCIUM 9.6 03/28/2021  ? ANIONGAP 9 03/21/2019  ? EGFR 72 03/28/2021  ? ?Lab Results  ?Component Value Date  ? CHOL 177 12/22/2020  ? ?Lab Results  ?Component Value Date  ? HDL 63 12/22/2020  ? ?Lab Results  ?Component Value Date  ? Concrete 87 12/22/2020  ? ?Lab Results  ?Component Value Date  ? TRIG 161 (H) 12/22/2020  ? ?Lab Results  ?Component Value Date  ? CHOLHDL 2.8 12/22/2020  ? ?Lab Results  ?Component Value Date  ? HGBA1C 5.5 12/22/2020  ? ? ?  ?Assessment & Plan:  ? ?Problem List Items Addressed This Visit   ?None ?Visit Diagnoses   ? ? Essential hypertension    -  Primary  ? Relevant Orders  ? POCT URINALYSIS DIP (CLINITEK)  ? Vaginal irritation      ? Relevant Orders  ? NuSwab Vaginitis Plus (VG+)  ?  Urinary tract infection symptoms      ? Relevant Orders  ? Urine Culture  ? ?  ? ?1. Essential hypertension ?BP 132/79 (BP Location: Right Arm, Patient Position: Sitting)   Pulse 77   Temp (!) 97.3 ?F (36.3 ?C)   Ht '5\' 4"'

## 2021-08-03 NOTE — Patient Instructions (Signed)

## 2021-08-04 LAB — BASIC METABOLIC PANEL
BUN/Creatinine Ratio: 30 — ABNORMAL HIGH (ref 12–28)
BUN: 25 mg/dL (ref 8–27)
CO2: 29 mmol/L (ref 20–29)
Calcium: 9.6 mg/dL (ref 8.7–10.3)
Chloride: 95 mmol/L — ABNORMAL LOW (ref 96–106)
Creatinine, Ser: 0.84 mg/dL (ref 0.57–1.00)
Glucose: 105 mg/dL — ABNORMAL HIGH (ref 70–99)
Potassium: 3.6 mmol/L (ref 3.5–5.2)
Sodium: 140 mmol/L (ref 134–144)
eGFR: 74 mL/min/{1.73_m2} (ref >59)

## 2021-08-06 LAB — NUSWAB VAGINITIS PLUS (VG+)
Candida albicans, NAA: NEGATIVE
Candida glabrata, NAA: NEGATIVE
Chlamydia trachomatis, NAA: NEGATIVE
Neisseria gonorrhoeae, NAA: NEGATIVE
Trich vag by NAA: NEGATIVE

## 2021-08-07 ENCOUNTER — Telehealth: Payer: Self-pay | Admitting: Family Medicine

## 2021-08-07 DIAGNOSIS — B379 Candidiasis, unspecified: Secondary | ICD-10-CM

## 2021-08-07 DIAGNOSIS — N10 Acute tubulo-interstitial nephritis: Secondary | ICD-10-CM

## 2021-08-07 MED ORDER — FLUCONAZOLE 150 MG PO TABS: 150.0000 mg | ORAL_TABLET | Freq: Once | ORAL | 0 refills | Status: AC

## 2021-08-07 MED ORDER — SULFAMETHOXAZOLE-TRIMETHOPRIM 800-160 MG PO TABS: 1.0000 | ORAL_TABLET | Freq: Two times a day (BID) | ORAL | 0 refills | Status: AC

## 2021-08-07 NOTE — Telephone Encounter (Signed)
Kenni Newton is a 71 year old female with a medical history significant for hypertension, history of seizure disorder, and depression presented for follow-up of chronic conditions.  Reviewed urine culture, yielded E. coli.  Will initiate the following medication regimen for urinary tract infection: ? ?Meds ordered this encounter  ?Medications  ? sulfamethoxazole-trimethoprim (BACTRIM DS) 800-160 MG tablet  ?  Sig: Take 1 tablet by mouth 2 (two) times daily for 7 days.  ?  Dispense:  14 tablet  ?  Refill:  0  ?  Order Specific Question:   Supervising Provider  ?  AnswerTresa Garter [[id number]]  ? fluconazole (DIFLUCAN) 150 MG tablet  ?  Sig: Take 1 tablet (150 mg total) by mouth once for 1 dose.  ?  Dispense:  1 tablet  ?  Refill:  0  ?  Order Specific Question:   Supervising Provider  ?  AnswerTresa Garter [[id number]]  ?  ? ?Notify patient, discussed laboratory results at length.  She expressed understanding of medication regimen.  Follow-up as scheduled. ? ? ?Donia Pounds  APRN, MSN, FNP-C ?Patient [place] ?[facility] ?[address]  ?[place], [place] [address] ?[phone] ? ?

## 2021-08-08 LAB — URINE CULTURE

## 2021-08-19 ENCOUNTER — Other Ambulatory Visit: Payer: Self-pay | Admitting: Family Medicine

## 2021-08-19 DIAGNOSIS — F32A Depression, unspecified: Secondary | ICD-10-CM

## 2021-08-19 DIAGNOSIS — I10 Essential (primary) hypertension: Secondary | ICD-10-CM

## 2021-08-24 DIAGNOSIS — Z20822 Contact with and (suspected) exposure to covid-19: Secondary | ICD-10-CM | POA: Diagnosis not present

## 2021-09-01 DIAGNOSIS — Z20822 Contact with and (suspected) exposure to covid-19: Secondary | ICD-10-CM | POA: Diagnosis not present

## 2021-09-09 DIAGNOSIS — Z20822 Contact with and (suspected) exposure to covid-19: Secondary | ICD-10-CM | POA: Diagnosis not present

## 2021-10-17 ENCOUNTER — Other Ambulatory Visit: Payer: Self-pay | Admitting: Family Medicine

## 2021-10-17 DIAGNOSIS — Z8669 Personal history of other diseases of the nervous system and sense organs: Secondary | ICD-10-CM

## 2021-10-17 DIAGNOSIS — K219 Gastro-esophageal reflux disease without esophagitis: Secondary | ICD-10-CM

## 2021-11-14 ENCOUNTER — Ambulatory Visit (INDEPENDENT_AMBULATORY_CARE_PROVIDER_SITE_OTHER): Payer: Medicare Other | Admitting: Family Medicine

## 2021-11-14 ENCOUNTER — Encounter: Payer: Self-pay | Admitting: Family Medicine

## 2021-11-14 VITALS — BP 130/67 | HR 57 | Temp 97.3°F | Ht 64.0 in | Wt 204.2 lb

## 2021-11-14 DIAGNOSIS — M25561 Pain in right knee: Secondary | ICD-10-CM

## 2021-11-14 DIAGNOSIS — J449 Chronic obstructive pulmonary disease, unspecified: Secondary | ICD-10-CM

## 2021-11-14 DIAGNOSIS — E669 Obesity, unspecified: Secondary | ICD-10-CM | POA: Diagnosis not present

## 2021-11-14 DIAGNOSIS — G8929 Other chronic pain: Secondary | ICD-10-CM

## 2021-11-14 DIAGNOSIS — D229 Melanocytic nevi, unspecified: Secondary | ICD-10-CM

## 2021-11-14 DIAGNOSIS — Z8669 Personal history of other diseases of the nervous system and sense organs: Secondary | ICD-10-CM

## 2021-11-14 DIAGNOSIS — I10 Essential (primary) hypertension: Secondary | ICD-10-CM

## 2021-11-14 NOTE — Progress Notes (Signed)
Pt presents for hypertension f/u. Pt states her legs and feet have felt numb "for a while now". Pt also requesting dermatology referral.

## 2021-11-14 NOTE — Patient Instructions (Signed)

## 2021-11-14 NOTE — Progress Notes (Signed)
Patient Eileen Morgan    Subjective   Patient ID: Eileen Morgan, female    DOB: February 23, 1951  Age: 71 y.o. MRN: [id number]  Chief Complaint  Patient presents with   Hypertension    Eileen Morgan is a very pleasant a 71 year old female with a medical history significant for hypertension, COPD, and history of seizure disorder presents for 49-monthfollow-up of chronic conditions.  The patient is doing fairly well and is without complaint on today.  [place]  Ms. LDrewhas a long history of hypertension that has been well controlled on hydrochlorothiazide.  She has been taking medication consistently.  She does not check blood pressure at home.  She has not been following a low-fat, low carbohydrate diet.  Patient does not exercise due to bilateral knee pain, however she stays active in her community she denies any lower extremity swelling, chest pain, shortness of breath, or heart palpitations.   Patient Active Problem List   Diagnosis Date Noted   History of seizure disorder 06/21/2019   Tobacco dependence in remission 06/21/2019   Depression 06/21/2019   Bladder leak 06/21/2019   GERD without esophagitis 06/21/2019   Anemia of chronic disease 06/21/2019   COPD with acute exacerbation ([facility] 03/21/2019   Chronic respiratory failure with hypoxia ([facility] 03/20/2019   Seizure ([facility] 03/20/2019   Past Medical History:  Diagnosis Date   COPD (chronic obstructive pulmonary disease) ([facility]    Pulmonary embolism ([facility])    Seizures ([facility]    Past Surgical History:  Procedure Laterality Date   BIOPSY  01/12/2021   Procedure: BIOPSY;  Surgeon: MIrving Copas, MD;  [facility]  Service: Gastroenterology;;  EGD and COLON   COLONOSCOPY WITH PROPOFOL N/A 01/12/2021   Procedure: COLONOSCOPY WITH PROPOFOL;  Surgeon: MIrving Copas, MD;  [place] [facility]  Service:  Gastroenterology;  Laterality: N/A;   ESOPHAGOGASTRODUODENOSCOPY (EGD) WITH PROPOFOL N/A 01/12/2021   Procedure: ESOPHAGOGASTRODUODENOSCOPY (EGD) WITH PROPOFOL;  Surgeon: MRush LandmarkGTelford Nab, MD;  [facility]  Service: Gastroenterology;  Laterality: N/A;   POLYPECTOMY  01/12/2021   Procedure: POLYPECTOMY;  Surgeon: Mansouraty, GTelford Nab, MD;  [facility];  Service: Gastroenterology;;   TONSILLECTOMY     TUBAL LIGATION     Social History   Tobacco Use   Smoking status: Former    Packs/day: 1.00    Years: 40.00    Total pack years: 40.00    Types: Cigarettes    Quit date: 03/08/2019    Years since quitting: 2.6   Smokeless tobacco: Never  Vaping Use   Vaping Use: Never used  Substance Use Topics   Alcohol use: Not Currently   Drug use: Never   Social History   Socioeconomic History   Marital status: Single    Spouse name: Not on file   Number of children: Not on file   Years of education: Not on file   Highest education level: Not on file  Occupational History   Not on file  Tobacco Use   Smoking status: Former    Packs/day: 1.00    Years: 40.00    Total pack years: 40.00    Types: Cigarettes    Quit date: 03/08/2019    Years since quitting: 2.6   Smokeless tobacco: Never  Vaping Use   Vaping Use: Never used  Substance and Sexual Activity   Alcohol use:  Not Currently   Drug use: Never   Sexual activity: Not on file  Other Topics Concern   Not on file  Social History Narrative   Not on file   Social Determinants of Health   Financial Resource Strain: Not on file  Food Insecurity: Not on file  Transportation Needs: Not on file  Physical Activity: Not on file  Stress: Not on file  Social Connections: Not on file  Intimate Partner Violence: Not on file   Family Status  Relation Name Status   Father  (Not Specified)   Brother  (Not Specified)   Neg Hx  (Not Specified)   Family History  Problem Relation Age of Onset   COPD Father     Emphysema Brother    CAD Neg Hx    Diabetes Mellitus II Neg Hx    Allergies  Allergen Reactions   Penicillins Anaphylaxis      Review of Systems  Constitutional: Negative.   HENT: Negative.    Eyes: Negative.   Respiratory: Negative.    Cardiovascular: Negative.   Genitourinary: Negative.   Musculoskeletal: Negative.   Skin: Negative.   Neurological: Negative.   Psychiatric/Behavioral: Negative.        Objective:     BP 130/67   Pulse (!) 57   Temp (!) 97.3 F (36.3 C)   Ht '5\' 4"'  (1.626 m)   Wt 204 lb 3.2 oz (92.6 kg)   BMI 35.05 kg/m  BP Readings from Last 3 Encounters:  11/14/21 130/67  08/03/21 132/79  03/28/21 139/75   Wt Readings from Last 3 Encounters:  11/14/21 204 lb 3.2 oz (92.6 kg)  08/03/21 194 lb 3.2 oz (88.1 kg)  03/28/21 206 lb 6 oz (93.6 kg)      Physical Exam Constitutional:      Appearance: Normal appearance. She is obese.  HENT:     Mouth/Throat:     Mouth: Mucous membranes are moist.  Eyes:     Pupils: Pupils are equal, round, and reactive to light.  Cardiovascular:     Rate and Rhythm: Normal rate and regular rhythm.     Pulses: Normal pulses.  Abdominal:     General: Abdomen is flat. Bowel sounds are normal.  Musculoskeletal:        General: Normal range of motion.  Skin:    General: Skin is warm.  Neurological:     General: No focal deficit present.     Mental Status: She is alert. Mental status is at baseline.  Psychiatric:        Mood and Affect: Mood normal.        Behavior: Behavior normal.        Thought Content: Thought content normal.        Judgment: Judgment normal.     No results found for any visits on 11/14/21.  Last CBC Lab Results  Component Value Date   WBC 6.2 06/16/2019   HGB 13.5 06/16/2019   HCT 41.9 06/16/2019   MCV 92 06/16/2019   MCH 29.6 06/16/2019   RDW 12.3 06/16/2019   PLT 240 72/53/6644   Last metabolic panel Lab Results  Component Value Date   GLUCOSE 105 (H) 08/03/2021    NA 140 08/03/2021   K 3.6 08/03/2021   CL 95 (L) 08/03/2021   CO2 29 08/03/2021   BUN 25 08/03/2021   CREATININE 0.84 08/03/2021   EGFR 74 08/03/2021   CALCIUM 9.6 08/03/2021   PROT 6.3 03/28/2021  ALBUMIN 4.3 03/28/2021   BILITOT 0.3 03/28/2021   ALKPHOS 72 03/28/2021   AST 19 03/28/2021   ALT 23 03/28/2021   ANIONGAP 9 03/21/2019   Last lipids Lab Results  Component Value Date   CHOL 177 12/22/2020   HDL 63 12/22/2020   LDLCALC 87 12/22/2020   TRIG 161 (H) 12/22/2020   CHOLHDL 2.8 12/22/2020   Last hemoglobin A1c Lab Results  Component Value Date   HGBA1C 5.5 12/22/2020   Last thyroid functions Lab Results  Component Value Date   TSH 3.540 12/22/2020   Last vitamin B12 and Folate No results found for: "VITAMINB12", "FOLATE"    The 10-year ASCVD risk score (Arnett DK, et al., 2019) is: 15.4%    Assessment & Plan:   Problem List Items Addressed This Visit       Other   History of seizure disorder   Other Visit Diagnoses     Essential hypertension    -  Primary   Relevant Orders   Lipid Panel   Comprehensive metabolic panel   Obesity (BMI 35.0-39.9 without comorbidity)       Chronic obstructive pulmonary disease, unspecified COPD type ([place])       Change in color of skin mole       Relevant Orders   Ambulatory referral to Dermatology     1. Essential hypertension BP 130/67   Pulse (!) 57   Temp (!) 97.3 F (36.3 C)   Ht '5\' 4"'  (1.626 m)   Wt 204 lb 3.2 oz (92.6 kg)   BMI 35.05 kg/m  - Continue medication, monitor blood pressure at home. Continue DASH diet.  Reminder to go to the ER if any CP, SOB, nausea, dizziness, severe HA, changes vision/speech, left arm numbness and tingling and jaw pain.   - Lipid Panel - Comprehensive metabolic panel  2. Obesity (BMI 35.0-39.9 without comorbidity) The patient is asked to make an attempt to improve diet and exercise patterns to aid in medical management of this problem.   3. History of seizure  disorder   4. Chronic obstructive pulmonary disease, unspecified COPD type [facility]) Patient is followed by pulmonology  5. Change in color of skin mole  - Ambulatory referral to Dermatology   Return in about 5 months (around 04/16/2022) for hypertension.    Donia Pounds  APRN, MSN, FNP-C Patient [place] [address] [place], [place] [address] [phone]

## 2021-11-15 LAB — COMPREHENSIVE METABOLIC PANEL
ALT: 23 IU/L (ref 0–32)
AST: 18 IU/L (ref 0–40)
Albumin/Globulin Ratio: 1.8 (ref 1.2–2.2)
Albumin: 4.2 g/dL (ref 3.8–4.8)
Alkaline Phosphatase: 68 IU/L (ref 44–121)
BUN/Creatinine Ratio: 27 (ref 12–28)
BUN: 23 mg/dL (ref 8–27)
Bilirubin Total: 0.3 mg/dL (ref 0.0–1.2)
CO2: 23 mmol/L (ref 20–29)
Calcium: 9.9 mg/dL (ref 8.7–10.3)
Chloride: 102 mmol/L (ref 96–106)
Creatinine, Ser: 0.84 mg/dL (ref 0.57–1.00)
Globulin, Total: 2.4 g/dL (ref 1.5–4.5)
Glucose: 92 mg/dL (ref 70–99)
Potassium: 4.8 mmol/L (ref 3.5–5.2)
Sodium: 141 mmol/L (ref 134–144)
Total Protein: 6.6 g/dL (ref 6.0–8.5)
eGFR: 74 mL/min/{1.73_m2} (ref >59)

## 2021-11-15 LAB — LIPID PANEL
Chol/HDL Ratio: 2.8 ratio (ref 0.0–4.4)
Cholesterol, Total: 186 mg/dL (ref 100–199)
HDL: 66 mg/dL (ref >39)
LDL Chol Calc (NIH): 91 mg/dL (ref 0–99)
Triglycerides: 169 mg/dL — ABNORMAL HIGH (ref 0–149)
VLDL Cholesterol Cal: 29 mg/dL (ref 5–40)

## 2021-11-15 NOTE — Progress Notes (Signed)
Reviewed all labs, triglycerices 169, goal is <150. Recommend OTC fish oil capsule every evening otherwise continue medication, monitor blood pressure at home. Continue low fat, low carbohydrate diet. Reminder to go to the ER if any CP, SOB, nausea, dizziness, severe HA, changes vision/speech, left arm numbness and tingling and jaw pain.  Will continue to follow up every 3 months.    Donia Pounds  APRN, MSN, FNP-C Patient [place] [address] [place], [place] [address] [phone]

## 2022-01-19 ENCOUNTER — Ambulatory Visit
Admission: RE | Admit: 2022-01-19 | Discharge: 2022-01-19 | Disposition: A | Discharge status: Home / Self Care | Payer: Medicare Other | Source: Ambulatory Visit | Attending: Family Medicine | Admitting: Family Medicine

## 2022-01-19 DIAGNOSIS — N6489 Other specified disorders of breast: Secondary | ICD-10-CM

## 2022-01-19 DIAGNOSIS — R928 Other abnormal and inconclusive findings on diagnostic imaging of breast: Secondary | ICD-10-CM | POA: Diagnosis not present

## 2022-02-06 ENCOUNTER — Other Ambulatory Visit: Payer: Self-pay | Admitting: Family Medicine

## 2022-02-06 DIAGNOSIS — G8929 Other chronic pain: Secondary | ICD-10-CM

## 2022-02-13 ENCOUNTER — Ambulatory Visit: Payer: Medicare Other | Admitting: Family Medicine

## 2022-02-16 ENCOUNTER — Other Ambulatory Visit: Payer: Self-pay | Admitting: Family Medicine

## 2022-02-16 DIAGNOSIS — F32A Depression, unspecified: Secondary | ICD-10-CM

## 2022-02-16 DIAGNOSIS — I10 Essential (primary) hypertension: Secondary | ICD-10-CM

## 2022-03-27 ENCOUNTER — Encounter: Payer: Self-pay | Admitting: Family Medicine

## 2022-03-27 ENCOUNTER — Ambulatory Visit (INDEPENDENT_AMBULATORY_CARE_PROVIDER_SITE_OTHER): Payer: Medicare Other | Admitting: Family Medicine

## 2022-03-27 VITALS — BP 142/69 | HR 60 | Temp 97.3°F | Wt 210.2 lb

## 2022-03-27 DIAGNOSIS — R635 Abnormal weight gain: Secondary | ICD-10-CM

## 2022-03-27 DIAGNOSIS — E669 Obesity, unspecified: Secondary | ICD-10-CM

## 2022-03-27 DIAGNOSIS — R739 Hyperglycemia, unspecified: Secondary | ICD-10-CM | POA: Diagnosis not present

## 2022-03-27 DIAGNOSIS — E8881 Metabolic syndrome: Secondary | ICD-10-CM

## 2022-03-27 DIAGNOSIS — Z23 Encounter for immunization: Secondary | ICD-10-CM | POA: Diagnosis not present

## 2022-03-27 DIAGNOSIS — I10 Essential (primary) hypertension: Secondary | ICD-10-CM | POA: Diagnosis not present

## 2022-03-27 LAB — POCT GLYCOSYLATED HEMOGLOBIN (HGB A1C): Hemoglobin A1C: 5.8 % — AB (ref 4.0–5.6)

## 2022-03-27 MED ORDER — WEGOVY 0.25 MG/0.5ML ~~LOC~~ SOAJ: 0.2500 mg | SUBCUTANEOUS | 6 refills | Status: DC

## 2022-03-27 NOTE — Progress Notes (Signed)
Established Patient Office Visit  Subjective   Patient ID: Eileen Morgan, female    DOB: 16-Feb-1951  Age: 71 y.o. MRN: [id number]     HPI  Patient Active Problem List   Diagnosis Date Noted  . History of seizure disorder 06/21/2019  . Tobacco dependence in remission 06/21/2019  . Depression 06/21/2019  . Bladder leak 06/21/2019  . GERD without esophagitis 06/21/2019  . Anemia of chronic disease 06/21/2019  . COPD with acute exacerbation ([place]) 03/21/2019  . Chronic respiratory failure with hypoxia ([place]) 03/20/2019  . Seizure ([place]) 03/20/2019      Review of Systems  Constitutional: Negative.   HENT: Negative.    Eyes: Negative.   Respiratory: Negative.    Cardiovascular: Negative.   Gastrointestinal: Negative.   Genitourinary: Negative.   Musculoskeletal:  Positive for back pain and joint pain.  Skin: Negative.   Neurological: Negative.   Psychiatric/Behavioral: Negative.        Objective:     BP (!) 142/69   Pulse 60   Temp (!) 97.3 F (36.3 C)   Wt 210 lb 3.2 oz (95.3 kg)   SpO2 95%   BMI 36.08 kg/m  BP Readings from Last 3 Encounters:  03/27/22 (!) 142/69  11/14/21 130/67  08/03/21 132/79   Wt Readings from Last 3 Encounters:  03/27/22 210 lb 3.2 oz (95.3 kg)  11/14/21 204 lb 3.2 oz (92.6 kg)  08/03/21 194 lb 3.2 oz (88.1 kg)      Physical Exam Constitutional:      Appearance: She is obese.  Eyes:     Pupils: Pupils are equal, round, and reactive to light.  Cardiovascular:     Rate and Rhythm: Normal rate and regular rhythm.     Pulses: Normal pulses.  Pulmonary:     Effort: Pulmonary effort is normal.  Abdominal:     General: Bowel sounds are normal.  Musculoskeletal:        General: Normal range of motion.  Skin:    General: Skin is warm.  Neurological:     General: No focal deficit present.     Mental Status: She is alert. Mental status is at baseline.  Psychiatric:        Mood and Affect: Mood normal.        Behavior: Behavior  normal.        Thought Content: Thought content normal.        Judgment: Judgment normal.    No results found for any visits on 03/27/22.  Last CBC Lab Results  Component Value Date   WBC 6.2 06/16/2019   HGB 13.5 06/16/2019   HCT 41.9 06/16/2019   MCV 92 06/16/2019   MCH 29.6 06/16/2019   RDW 12.3 06/16/2019   PLT 240 15/72/6203   Last metabolic panel Lab Results  Component Value Date   GLUCOSE 92 11/14/2021   NA 141 11/14/2021   K 4.8 11/14/2021   CL 102 11/14/2021   CO2 23 11/14/2021   BUN 23 11/14/2021   CREATININE 0.84 11/14/2021   EGFR 74 11/14/2021   CALCIUM 9.9 11/14/2021   PROT 6.6 11/14/2021   ALBUMIN 4.2 11/14/2021   LABGLOB 2.4 11/14/2021   AGRATIO 1.8 11/14/2021   BILITOT 0.3 11/14/2021   ALKPHOS 68 11/14/2021   AST 18 11/14/2021   ALT 23 11/14/2021   ANIONGAP 9 03/21/2019   Last lipids Lab Results  Component Value Date   CHOL 186 11/14/2021   HDL 66 11/14/2021   LDLCALC  91 11/14/2021   TRIG 169 (H) 11/14/2021   CHOLHDL 2.8 11/14/2021   Last hemoglobin A1c Lab Results  Component Value Date   HGBA1C 5.5 12/22/2020   Last thyroid functions Lab Results  Component Value Date   TSH 3.540 12/22/2020   Last vitamin D No results found for: "25OHVITD2", "25OHVITD3", "VD25OH" Last vitamin B12 and Folate No results found for: "VITAMINB12", "FOLATE"    The 10-year ASCVD risk score (Arnett DK, et al., 2019) is: 20.3%    Assessment & Plan:   Problem List Items Addressed This Visit   None Visit Diagnoses     Flu vaccine need    -  Primary   Relevant Orders   Flu Vaccine QUAD High Dose([place])       No follow-ups on file.    Cammie Sickle, FNP

## 2022-03-27 NOTE — Patient Instructions (Signed)
Semaglutide Injection (Weight Management) What is this medication? SEMAGLUTIDE (SEM a GLOO tide) promotes weight loss. It may also be used to maintain weight loss. It works by decreasing appetite. Changes to diet and exercise are often combined with this medication. This medicine may be used for other purposes; ask your health care provider or pharmacist if you have questions. COMMON BRAND NAME(S): RKYHCW What should I tell my care team before I take this medication? They need to know if you have any of these conditions: Endocrine tumors (MEN 2) or if someone in your family had these tumors Eye disease, vision problems Gallbladder disease History of depression or mental health disease History of pancreatitis Kidney disease Stomach or intestine problems Suicidal thoughts, plans, or attempt; a previous suicide attempt by you or a family member Thyroid cancer or if someone in your family had thyroid cancer An unusual or allergic reaction to semaglutide, other medications, foods, dyes, or preservatives Pregnant or trying to get pregnant Breast-feeding How should I use this medication? This medication is injected under the skin. You will be taught how to prepare and give it. Take it as directed on the prescription label. It is given once every week (every 7 days). Keep taking it unless your care team tells you to stop. It is important that you put your used needles and pens in a special sharps container. Do not put them in a trash can. If you do not have a sharps container, call your pharmacist or care team to get one. A special MedGuide will be given to you by the pharmacist with each prescription and refill. Be sure to read this information carefully each time. This medication comes with INSTRUCTIONS FOR USE. Ask your pharmacist for directions on how to use this medication. Read the information carefully. Talk to your pharmacist or care team if you have questions. Talk to your care team about  the use of this medication in children. While it may be prescribed for children as young as 12 years for selected conditions, precautions do apply. Overdosage: If you think you have taken too much of this medicine contact a poison control center or emergency room at once. NOTE: This medicine is only for you. Do not share this medicine with others. What if I miss a dose? If you miss a dose and the next scheduled dose is more than 2 days away, take the missed dose as soon as possible. If you miss a dose and the next scheduled dose is less than 2 days away, do not take the missed dose. Take the next dose at your regular time. Do not take double or extra doses. If you miss your dose for 2 weeks or more, take the next dose at your regular time or call your care team to talk about how to restart this medication. What may interact with this medication? Insulin and other medications for diabetes This list may not describe all possible interactions. Give your health care provider a list of all the medicines, herbs, non-prescription drugs, or dietary supplements you use. Also tell them if you smoke, drink alcohol, or use illegal drugs. Some items may interact with your medicine. What should I watch for while using this medication? Visit your care team for regular checks on your progress. It may be some time before you see the benefit from this medication. Drink plenty of fluids while taking this medication. Check with your care team if you have severe diarrhea, nausea, and vomiting, or if you sweat a  lot. The loss of too much body fluid may make it dangerous for you to take this medication. This medication may affect blood sugar levels. Ask your care team if changes in diet or medications are needed if you have diabetes. If you or your family notice any changes in your behavior, such as new or worsening depression, thoughts of harming yourself, anxiety, other unusual or disturbing thoughts, or memory loss, call  your care team right away. Women should inform their care team if they wish to become pregnant or think they might be pregnant. Losing weight while pregnant is not advised and may cause harm to the unborn child. Talk to your care team for more information. What side effects may I notice from receiving this medication? Side effects that you should report to your care team as soon as possible: Allergic reactions--skin rash, itching, hives, swelling of the face, lips, tongue, or throat Change in vision Dehydration--increased thirst, dry mouth, feeling faint or lightheaded, headache, dark yellow or brown urine Gallbladder problems--severe stomach pain, nausea, vomiting, fever Heart palpitations--rapid, pounding, or irregular heartbeat Kidney injury--decrease in the amount of urine, swelling of the ankles, hands, or feet Pancreatitis--severe stomach pain that spreads to your back or gets worse after eating or when touched, fever, nausea, vomiting Thoughts of suicide or self-harm, worsening mood, feelings of depression Thyroid cancer--new mass or lump in the neck, pain or trouble swallowing, trouble breathing, hoarseness Side effects that usually do not require medical attention (report to your care team if they continue or are bothersome): Diarrhea Loss of appetite Nausea Stomach pain Vomiting This list may not describe all possible side effects. Call your doctor for medical advice about side effects. You may report side effects to FDA at 1-800-FDA-1088. Where should I keep my medication? Keep out of the reach of children and pets. Refrigeration (preferred): Store in the refrigerator. Do not freeze. Keep this medication in the original container until you are ready to take it. Get rid of any unused medication after the expiration date. Room temperature: If needed, prior to cap removal, the pen can be stored at room temperature for up to 28 days. Protect from light. If it is stored at room  temperature, get rid of any unused medication after 28 days or after it expires, whichever is first. It is important to get rid of the medication as soon as you no longer need it or it is expired. You can do this in two ways: Take the medication to a medication take-back program. Check with your pharmacy or law enforcement to find a location. If you cannot return the medication, follow the directions in the [place]. NOTE: This sheet is a summary. It may not cover all possible information. If you have questions about this medicine, talk to your doctor, pharmacist, or health care provider.  2023 Elsevier/Gold Standard (2020-07-07 00:00:00)  Preventing Unhealthy Weight Gain, Adult Staying at a healthy weight is important to your overall health. When fat builds up in your body, you may become overweight or obese. Being overweight or obese increases your risk of developing various health problems. Unhealthy weight gain is often the result of making unhealthy food choices or not getting enough exercise. You can make changes to your lifestyle to prevent obesity and stay as healthy as possible. How can unhealthy weight gain affect me? Being overweight or obese can cause you to develop joint or bone problems, which can make it hard for you to stay active or do activities you enjoy. Being  overweight also puts stress on your heart and lungs and can lead to health problems such as: Diabetes. Heart disease. Some types of cancer. Stroke. Eating healthy, staying active, and having healthy habits can help to prevent unhealthy weight gain and lower your risk for health problems. These lifestyle changes will also help you manage stress and emotions, improve your self-esteem, and connect with friends and family. What can increase my risk? In addition to certain eating and lifestyle choices, some other factors that may make you more likely to have unhealthy weight gain include: Having a family history of  obesity. Living in an area with limited access to: [place], recreation centers, or sidewalks. Healthy food choices, such as grocery stores and farmers' markets. What actions can I take to prevent unhealthy weight gain? Nutrition  Eat only as much as your body needs. To do this: Pay attention to signs that you are hungry or full. Stop eating as soon as you feel full. If you feel hungry, try drinking water first before eating. Drink enough water so your urine is pale yellow. Eat smaller portions. Pay attention to portion sizes when eating out. Look at serving sizes on food labels. Most foods contain more than one serving per container. Eat the recommended number of calories for your gender and activity level. For most active people, a daily total of 2,000 calories is appropriate. If you are trying to lose weight or are not very active, you may need to eat fewer calories. Talk with your health care provider or a dietitian about how many calories you need each day. Choose healthy foods, such as: Fruits and vegetables. At each meal, try to fill at least half of your plate with fruits and vegetables. Whole grains, such as whole-wheat bread, brown rice, and quinoa. Lean meats, such as chicken or fish. Other healthy proteins, such as beans, eggs, or tofu. Healthy fats, such as nuts, seeds, fatty fish, and olive oil. Low-fat or fat-free dairy products. Check food labels, and avoid food and drinks that: Are high in calories. Have added sugar. Are high in sodium. Have saturated fats or trans fats. Cook foods in healthier ways, such as by baking, broiling, or grilling. Make a meal plan for the week, and shop with a grocery list to help you stay on track with your purchases. Try to avoid going to the grocery store when you are hungry. When grocery shopping, try to shop around the outside of the store first, where the fresh foods are. Doing this helps you avoid prepackaged foods, which can be high in  sugar, salt (sodium), and fat. Lifestyle  Exercise for 30 or more minutes on 5 or more days each week. Exercising may include brisk walking, yard work, biking, running, swimming, and team sports like basketball and soccer. Ask your health care provider which exercises are safe for you. Do activities that strengthen the muscles, such as lifting weights or using resistance bands, on 2 or more days a week. Do not use any products that contain nicotine or tobacco. These products include cigarettes, chewing tobacco, and vaping devices, such as e-cigarettes. If you need help quitting, ask your health care provider. If you drink alcohol: Limit how much you have to: 0-1 drink a day for women who are not pregnant. 0-2 drinks a day for men. Know how much alcohol is in a drink. In the [place] one drink equals one 12 oz bottle of beer (355 mL), one 5 oz glass of wine (148 mL), or one  1 oz glass of hard liquor (44 mL). Try to get 7-9 hours of sleep each night. Other changes Keep a food and activity journal to keep track of: What you ate and how many calories you had. Remember to count the calories in sauces, dressings, and side dishes. Whether you were active, and what exercises you did. Your calorie, weight, and activity goals. Check your weight regularly. Track any changes. If you notice that you have gained weight, make changes to your diet or activity routine. Avoid taking weight-loss medicines or supplements. Talk to your health care provider before starting any new medicine or supplement. Talk to your health care provider before trying any new diet or exercise plan. Where to find more information Talk with your health care provider or a dietitian about healthy eating and healthy lifestyle choices. You may also find information from: [place]. Department of Agriculture, MyPlate: FormerBoss.no American Heart Association: www.heart.org Centers for Disease Control and Prevention:  http://www.wolf.info/ Summary Eating healthy, staying active, and having healthy habits can help to prevent unhealthy weight gain and lower your risk for health problems such as heart disease, diabetes, some types of cancer, and stroke. Being overweight or obese can cause you to develop joint or bone problems, which can make it hard for you to stay active or do activities you enjoy. You can prevent unhealthy weight gain by eating a healthy diet, exercising regularly, not smoking, limiting alcohol, and getting enough sleep. Talk with your health care provider or a dietitian for guidance about healthy eating and healthy lifestyle choices. This information is not intended to replace advice given to you by your health care provider. Make sure you discuss any questions you have with your health care provider. Document Revised: 11/18/2020 Document Reviewed: 11/18/2020 Elsevier Patient Education  [place].

## 2022-03-28 LAB — BASIC METABOLIC PANEL
BUN/Creatinine Ratio: 25 (ref 12–28)
BUN: 21 mg/dL (ref 8–27)
CO2: 24 mmol/L (ref 20–29)
Calcium: 9.4 mg/dL (ref 8.7–10.3)
Chloride: 100 mmol/L (ref 96–106)
Creatinine, Ser: 0.83 mg/dL (ref 0.57–1.00)
Glucose: 101 mg/dL — ABNORMAL HIGH (ref 70–99)
Potassium: 4.1 mmol/L (ref 3.5–5.2)
Sodium: 140 mmol/L (ref 134–144)
eGFR: 74 mL/min/{1.73_m2} (ref >59)

## 2022-03-28 LAB — TSH: TSH: 2.85 u[IU]/mL (ref 0.450–4.500)

## 2022-04-10 ENCOUNTER — Other Ambulatory Visit: Payer: Self-pay | Admitting: Family Medicine

## 2022-04-10 DIAGNOSIS — K219 Gastro-esophageal reflux disease without esophagitis: Secondary | ICD-10-CM

## 2022-04-10 DIAGNOSIS — Z8669 Personal history of other diseases of the nervous system and sense organs: Secondary | ICD-10-CM

## 2022-04-10 NOTE — Telephone Encounter (Signed)
[facility] is requesting to fill pt keppra. Please advise kH

## 2022-04-13 ENCOUNTER — Other Ambulatory Visit: Payer: Self-pay | Admitting: Family Medicine

## 2022-04-13 DIAGNOSIS — N3281 Overactive bladder: Secondary | ICD-10-CM

## 2022-04-13 DIAGNOSIS — N3289 Other specified disorders of bladder: Secondary | ICD-10-CM

## 2022-05-17 ENCOUNTER — Ambulatory Visit (INDEPENDENT_AMBULATORY_CARE_PROVIDER_SITE_OTHER): Payer: Medicare Other

## 2022-05-17 VITALS — Ht 64.0 in | Wt 211.0 lb

## 2022-05-17 DIAGNOSIS — Z780 Asymptomatic menopausal state: Secondary | ICD-10-CM

## 2022-05-17 DIAGNOSIS — Z0000 Encounter for general adult medical examination without abnormal findings: Secondary | ICD-10-CM

## 2022-05-17 DIAGNOSIS — Z122 Encounter for screening for malignant neoplasm of respiratory organs: Secondary | ICD-10-CM

## 2022-05-17 NOTE — Progress Notes (Signed)
Subjective:   Eileen Morgan is a 72 y.o. female who presents for Medicare Annual (Subsequent) preventive examination. I connected with  Eileen Morgan on 05/17/22 by a audio enabled telemedicine application and verified that I am speaking with the correct person using two identifiers.  Patient Location: Home  Provider Location: Home Office  I discussed the limitations of evaluation and management by telemedicine. The patient expressed understanding and agreed to proceed.  Review of Systems     Cardiac Risk Factors include: advanced age (>26mn, >>31women);hypertension     Objective:    Today's Vitals   05/17/22 1504  Weight: 211 lb (95.7 kg)  Height: '5\' 4"'$  (1.626 m)   Body mass index is 36.22 kg/m.     05/17/2022    3:07 PM 01/12/2021    6:43 AM 03/19/2019   11:42 PM  Advanced Directives  Does Patient Have a Medical Advance Directive? Yes No No  Type of AParamedicof [place]Living will    Copy of [place] Chart? No - copy requested    Would patient like information on creating a medical advance directive?  No - Patient declined No - Patient declined    Current Medications (verified) Outpatient Encounter Medications as of 05/17/2022  Medication Sig   buPROPion (WELLBUTRIN SR) 200 MG 12 hr tablet TAKE 1 TABLET BY MOUTH TWICE A DAY   hydrochlorothiazide (HYDRODIURIL) 25 MG tablet TAKE 1 TABLET (25 MG TOTAL) BY MOUTH DAILY.   levETIRAcetam (KEPPRA) 500 MG tablet TAKE 1 TABLET BY MOUTH TWICE A DAY   meloxicam (MOBIC) 15 MG tablet TAKE 1 TABLET (15 MG TOTAL) BY MOUTH DAILY.   omeprazole (PRILOSEC) 20 MG capsule TAKE 1 CAPSULE BY MOUTH EVERY DAY   oxybutynin (DITROPAN-XL) 10 MG 24 hr tablet TAKE 1 TABLET BY MOUTH EVERYDAY AT BEDTIME   OXYGEN Inhale 2 L into the lungs daily as needed (oxygen).   Semaglutide-Weight Management (WEGOVY) 0.25 MG/0.5ML SOAJ Inject 0.25 mg into the skin once a week.   No facility-administered encounter  medications on file as of 05/17/2022.    Allergies (verified) Penicillins   History: Past Medical History:  Diagnosis Date   COPD (chronic obstructive pulmonary disease) ([facility]    Pulmonary embolism ([facility])    Seizures ([facility]    Past Surgical History:  Procedure Laterality Date   BIOPSY  01/12/2021   Procedure: BIOPSY;  Surgeon: MRush LandmarkGTelford Nab, MD;  [place] [facility]  Service: Gastroenterology;;  EGD and COLON   COLONOSCOPY WITH PROPOFOL N/A 01/12/2021   Procedure: COLONOSCOPY WITH PROPOFOL;  Surgeon: MIrving Copas, MD;  [place] [facility]  Service: Gastroenterology;  Laterality: N/A;   ESOPHAGOGASTRODUODENOSCOPY (EGD) WITH PROPOFOL N/A 01/12/2021   Procedure: ESOPHAGOGASTRODUODENOSCOPY (EGD) WITH PROPOFOL;  Surgeon: MRush LandmarkGTelford Nab, MD;  [place] [facility]  Service: Gastroenterology;  Laterality: N/A;   POLYPECTOMY  01/12/2021   Procedure: POLYPECTOMY;  Surgeon: Mansouraty, GTelford Nab, MD;  [facility];  Service: Gastroenterology;;   TONSILLECTOMY     TUBAL LIGATION     Family History  Problem Relation Age of Onset   COPD Father    Emphysema Brother    CAD Neg Hx    Diabetes Mellitus II Neg Hx    Social History   Socioeconomic History   Marital status: Single    Spouse name: Not on file   Number of children: Not on file   Years of education: Not on file   Highest education level: Not  on file  Occupational History   Not on file  Tobacco Use   Smoking status: Former    Packs/day: 1.00    Years: 40.00    Total pack years: 40.00    Types: Cigarettes    Quit date: 03/08/2019    Years since quitting: 3.1   Smokeless tobacco: Never  Vaping Use   Vaping Use: Never used  Substance and Sexual Activity   Alcohol use: Not Currently   Drug use: Never   Sexual activity: Not on file  Other Topics Concern   Not on file  Social History Narrative   Not on file   Social Determinants of Health   Financial Resource Strain: Low Risk   (05/17/2022)   Overall Financial Resource Strain (CARDIA)    Difficulty of Paying Living Expenses: Not hard at all  Food Insecurity: No Food Insecurity (05/17/2022)   Hunger Vital Sign    Worried About Running Out of Food in the Last Year: Never true    Ran Out of Food in the Last Year: Never true  Transportation Needs: No Transportation Needs (05/17/2022)   PRAPARE - Hydrologist (Medical): No    Lack of Transportation (Non-Medical): No  Physical Activity: Insufficiently Active (05/17/2022)   Exercise Vital Sign    Days of Exercise per Week: 3 days    Minutes of Exercise per Session: 30 min  Stress: No Stress Concern Present (05/17/2022)   [place]    Feeling of Stress : Not at all  Social Connections: Socially Isolated (05/17/2022)   Social Connection and Isolation Panel [NHANES]    Frequency of Communication with Friends and Family: More than three times a week    Frequency of Social Gatherings with Friends and Family: More than three times a week    Attends Religious Services: Never    Marine scientist or Organizations: No    Attends Music therapist: Never    Marital Status: Divorced    Tobacco Counseling Counseling given: Not Answered   Clinical Intake:  Pre-visit preparation completed: Yes  Pain : No/denies pain     Nutritional Risks: None Diabetes: No  How often do you need to have someone help you when you read instructions, pamphlets, or other written materials from your doctor or pharmacy?: 1 - Never  Diabetic?no   Interpreter Needed?: No  Information entered by :: Jadene Pierini, LPN   Activities of Daily Living    05/17/2022    3:07 PM  In your present state of health, do you have any difficulty performing the following activities:  Hearing? 0  Vision? 0  Difficulty concentrating or making decisions? 0  Walking or climbing stairs? 0   Dressing or bathing? 0  Doing errands, shopping? 0  Preparing Food and eating ? N  Using the Toilet? N  In the past six months, have you accidently leaked urine? N  Do you have problems with loss of bowel control? N  Managing your Medications? N  Managing your Finances? N  Housekeeping or managing your Housekeeping? N    Patient Care Team: Dorena Dew, FNP as PCP - General (Family Medicine)  Indicate any recent Medical Services you may have received from other than [facility] providers in the past year (date may be approximate).     Assessment:   This is a routine wellness examination for Eileen Morgan.  Hearing/Vision screen Vision Screening - Comments:: Wears rx  glasses - up to date with routine eye exams with  [facility]   Dietary issues and exercise activities discussed: Current Exercise Habits: Home exercise routine, Type of exercise: walking, Time (Minutes): 30, Frequency (Times/Week): 3, Weekly Exercise (Minutes/Week): 90, Intensity: Mild, Exercise limited by: None identified   Goals Addressed             This Visit's Progress    Exercise 3x per week (30 min per time)         Depression Screen    05/17/2022    3:06 PM 03/27/2022    9:56 AM 11/14/2021   10:52 AM 03/28/2021    9:23 AM 12/22/2020    8:32 AM 05/17/2020    2:07 PM 11/17/2019    1:09 PM  PHQ 2/9 Scores  PHQ - 2 Score 0 0 0 3 0 0 1  PHQ- 9 Score   0 6   5    Fall Risk    05/17/2022    3:05 PM 03/27/2022    9:56 AM 08/03/2021   10:58 AM 03/28/2021    9:22 AM 12/22/2020    8:32 AM  Fall Risk   Falls in the past year? 0 0 0 0 0  Number falls in past yr: 0 0 0 0 0  Injury with Fall? 0 0 0 0 0  Risk for fall due to : No Fall Risks No Fall Risks No Fall Risks    Follow up Falls prevention discussed Falls evaluation completed Falls evaluation completed      FALL RISK PREVENTION PERTAINING TO THE HOME:  Any stairs in or around the home? Yes  If so, are there any without handrails? No  Home free of  loose throw rugs in walkways, pet beds, electrical cords, etc? Yes  Adequate lighting in your home to reduce risk of falls? Yes   ASSISTIVE DEVICES UTILIZED TO PREVENT FALLS:  Life alert? No  Use of a cane, walker or w/c? No  Grab bars in the bathroom? No  Shower chair or bench in shower? No  Elevated toilet seat or a handicapped toilet? No          05/17/2022    3:08 PM  6CIT Screen  What Year? 0 points  What month? 0 points  What time? 0 points  Count back from 20 0 points  Months in reverse 0 points  Repeat phrase 0 points  Total Score 0 points    Immunizations Immunization History  Administered Date(s) Administered   Fluad Quad(high Dose 65+) 03/27/2022   Influenza,inj,Quad PF,6+ Mos 04/23/2020, 03/28/2021   PFIZER(Purple Top)SARS-COV-2 Vaccination 04/23/2020, 05/11/2020   Pneumococcal Conjugate-13 06/21/2020   Tdap 03/28/2021    TDAP status: Up to date  Flu Vaccine status: Up to date  Pneumococcal vaccine status: Up to date  Covid-19 vaccine status: Completed vaccines  Qualifies for Shingles Vaccine? Yes   Zostavax completed Yes   Shingrix Completed?: Yes  Screening Tests Health Maintenance  Topic Date Due   COVID-19 Vaccine (3 - 2023-24 season) 01/05/2022   Zoster Vaccines- Shingrix (1 of 2) 06/27/2022 (Originally [birth year]/09/1999)   Pneumonia Vaccine 29+ Years old (2 - PPSV23 or PCV20) 08/04/2022 (Originally 06/21/2021)   DEXA SCAN  08/04/2022 (Originally 03/08/2014)   Lung Cancer Screening  03/28/2023 (Originally 03/19/2020)   Medicare Annual Wellness (AWV)  05/18/2023   MAMMOGRAM  01/20/2024   COLONOSCOPY (Pts 45-8yr Insurance coverage will need to be confirmed)  01/13/2031   DTaP/Tdap/Td (2 - Td or  Tdap) 03/29/2031   INFLUENZA VACCINE  Completed   Hepatitis C Screening  Completed   HPV VACCINES  Aged Out    Health Maintenance  Health Maintenance Due  Topic Date Due   COVID-19 Vaccine (3 - 2023-24 season) 01/05/2022    Colorectal cancer  screening: Type of screening: Colonoscopy. Completed 01/12/2021. Repeat every 10 years  Mammogram status: Completed 01/19/2022. Repeat every year  Bone Density status: Ordered 05/17/2022. Pt provided with contact info and advised to call to schedule appt.  Lung Cancer Screening: (Low Dose CT Chest recommended if Age 70-80 years, 30 pack-year currently smoking OR have quit w/in 15years.) does not qualify.   Lung Cancer Screening Referral: n/a  Additional Screening:  Hepatitis C Screening: does not qualify; Completed 03/28/2021  Vision Screening: Recommended annual ophthalmology exams for early detection of glaucoma and other disorders of the eye. Is the patient up to date with their annual eye exam?  Yes  Who is the provider or what is the name of the office in which the patient attends annual eye exams? [facility]  If pt is not established with a provider, would they like to be referred to a provider to establish care? No .   Dental Screening: Recommended annual dental exams for proper oral hygiene  Community Resource Referral / Chronic Care Management: CRR required this visit?  No   CCM required this visit?  No      Plan:     I have personally reviewed and noted the following in the patient's chart:   Medical and social history Use of alcohol, tobacco or illicit drugs  Current medications and supplements including opioid prescriptions. Patient is not currently taking opioid prescriptions. Functional ability and status Nutritional status Physical activity Advanced directives List of other physicians Hospitalizations, surgeries, and ER visits in previous 12 months Vitals Screenings to include cognitive, depression, and falls Referrals and appointments  In addition, I have reviewed and discussed with patient certain preventive protocols, quality metrics, and best practice recommendations. A written personalized care plan for preventive services as well as general  preventive health recommendations were provided to patient.     Daphane Shepherd, LPN   [id number]/61/[id number]   Nurse Notes: none

## 2022-05-17 NOTE — Patient Instructions (Signed)
Eileen Morgan , Thank you for taking time to come for your Medicare Wellness Visit. I appreciate your ongoing commitment to your health goals. Please review the following plan we discussed and let me know if I can assist you in the future.   These are the goals we discussed:  Goals      Exercise 3x per week (30 min per time)        This is a list of the screening recommended for you and due dates:  Health Maintenance  Topic Date Due   COVID-19 Vaccine (3 - 2023-24 season) 01/05/2022   Zoster (Shingles) Vaccine (1 of 2) 06/27/2022*   Pneumonia Vaccine (2 - PPSV23 or PCV20) 08/04/2022*   DEXA scan (bone density measurement)  08/04/2022*   Screening for Lung Cancer  03/28/2023*   Medicare Annual Wellness Visit  05/18/2023   Mammogram  01/20/2024   Colon Cancer Screening  01/13/2031   DTaP/Tdap/Td vaccine (2 - Td or Tdap) 03/29/2031   Flu Shot  Completed   Hepatitis C Screening: USPSTF Recommendation to screen - Ages 18-79 yo.  Completed   HPV Vaccine  Aged Out  *Topic was postponed. The date shown is not the original due date.    Advanced directives: Please bring a copy of your health care power of attorney and living will to the office to be added to your chart at your convenience.   Conditions/risks identified: Aim for 30 minutes of exercise or brisk walking, 6-8 glasses of water, and 5 servings of fruits and vegetables each day.   Next appointment: Follow up in one year for your annual wellness visit    Preventive Care 65 Years and Older, Female Preventive care refers to lifestyle choices and visits with your health care provider that can promote health and wellness. What does preventive care include? A yearly physical exam. This is also called an annual well check. Dental exams once or twice a year. Routine eye exams. Ask your health care provider how often you should have your eyes checked. Personal lifestyle choices, including: Daily care of your teeth and gums. Regular  physical activity. Eating a healthy diet. Avoiding tobacco and drug use. Limiting alcohol use. Practicing safe sex. Taking low-dose aspirin every day. Taking vitamin and mineral supplements as recommended by your health care provider. What happens during an annual well check? The services and screenings done by your health care provider during your annual well check will depend on your age, overall health, lifestyle risk factors, and family history of disease. Counseling  Your health care provider may ask you questions about your: Alcohol use. Tobacco use. Drug use. Emotional well-being. Home and relationship well-being. Sexual activity. Eating habits. History of falls. Memory and ability to understand (cognition). Work and work Statistician. Reproductive health. Screening  You may have the following tests or measurements: Height, weight, and BMI. Blood pressure. Lipid and cholesterol levels. These may be checked every 5 years, or more frequently if you are over 22 years old. Skin check. Lung cancer screening. You may have this screening every year starting at age 94 if you have a 30-pack-year history of smoking and currently smoke or have quit within the past 15 years. Fecal occult blood test (FOBT) of the stool. You may have this test every year starting at age 50. Flexible sigmoidoscopy or colonoscopy. You may have a sigmoidoscopy every 5 years or a colonoscopy every 10 years starting at age 1. Hepatitis C blood test. Hepatitis B blood test. Sexually transmitted disease (  STD) testing. Diabetes screening. This is done by checking your blood sugar (glucose) after you have not eaten for a while (fasting). You may have this done every 1-3 years. Bone density scan. This is done to screen for osteoporosis. You may have this done starting at age 78. Mammogram. This may be done every 1-2 years. Talk to your health care provider about how often you should have regular mammograms. Talk  with your health care provider about your test results, treatment options, and if necessary, the need for more tests. Vaccines  Your health care provider may recommend certain vaccines, such as: Influenza vaccine. This is recommended every year. Tetanus, diphtheria, and acellular pertussis (Tdap, Td) vaccine. You may need a Td booster every 10 years. Zoster vaccine. You may need this after age 37. Pneumococcal 13-valent conjugate (PCV13) vaccine. One dose is recommended after age 44. Pneumococcal polysaccharide (PPSV23) vaccine. One dose is recommended after age 72. Talk to your health care provider about which screenings and vaccines you need and how often you need them. This information is not intended to replace advice given to you by your health care provider. Make sure you discuss any questions you have with your health care provider. Document Released: 05/20/2015 Document Revised: 01/11/2016 Document Reviewed: 02/22/2015 Elsevier Interactive Patient Education  2017 [employer] Prevention in the Home Falls can cause injuries. They can happen to people of all ages. There are many things you can do to make your home safe and to help prevent falls. What can I do on the outside of my home? Regularly fix the edges of walkways and driveways and fix any cracks. Remove anything that might make you trip as you walk through a door, such as a raised step or threshold. Trim any bushes or trees on the path to your home. Use bright outdoor lighting. Clear any walking paths of anything that might make someone trip, such as rocks or tools. Regularly check to see if handrails are loose or broken. Make sure that both sides of any steps have handrails. Any raised decks and porches should have guardrails on the edges. Have any leaves, snow, or ice cleared regularly. Use sand or salt on walking paths during winter. Clean up any spills in your garage right away. This includes oil or grease  spills. What can I do in the bathroom? Use night lights. Install grab bars by the toilet and in the tub and shower. Do not use towel bars as grab bars. Use non-skid mats or decals in the tub or shower. If you need to sit down in the shower, use a plastic, non-slip stool. Keep the floor dry. Clean up any water that spills on the floor as soon as it happens. Remove soap buildup in the tub or shower regularly. Attach bath mats securely with double-sided non-slip rug tape. Do not have throw rugs and other things on the floor that can make you trip. What can I do in the bedroom? Use night lights. Make sure that you have a light by your bed that is easy to reach. Do not use any sheets or blankets that are too big for your bed. They should not hang down onto the floor. Have a firm chair that has side arms. You can use this for support while you get dressed. Do not have throw rugs and other things on the floor that can make you trip. What can I do in the kitchen? Clean up any spills right away. Avoid walking on  wet floors. Keep items that you use a lot in easy-to-reach places. If you need to reach something above you, use a strong step stool that has a grab bar. Keep electrical cords out of the way. Do not use floor polish or wax that makes floors slippery. If you must use wax, use non-skid floor wax. Do not have throw rugs and other things on the floor that can make you trip. What can I do with my stairs? Do not leave any items on the stairs. Make sure that there are handrails on both sides of the stairs and use them. Fix handrails that are broken or loose. Make sure that handrails are as long as the stairways. Check any carpeting to make sure that it is firmly attached to the stairs. Fix any carpet that is loose or worn. Avoid having throw rugs at the top or bottom of the stairs. If you do have throw rugs, attach them to the floor with carpet tape. Make sure that you have a light switch at the  top of the stairs and the bottom of the stairs. If you do not have them, ask someone to add them for you. What else can I do to help prevent falls? Wear shoes that: Do not have high heels. Have rubber bottoms. Are comfortable and fit you well. Are closed at the toe. Do not wear sandals. If you use a stepladder: Make sure that it is fully opened. Do not climb a closed stepladder. Make sure that both sides of the stepladder are locked into place. Ask someone to hold it for you, if possible. Clearly mark and make sure that you can see: Any grab bars or handrails. First and last steps. Where the edge of each step is. Use tools that help you move around (mobility aids) if they are needed. These include: Canes. Walkers. Scooters. Crutches. Turn on the lights when you go into a dark area. Replace any light bulbs as soon as they burn out. Set up your furniture so you have a clear path. Avoid moving your furniture around. If any of your floors are uneven, fix them. If there are any pets around you, be aware of where they are. Review your medicines with your doctor. Some medicines can make you feel dizzy. This can increase your chance of falling. Ask your doctor what other things that you can do to help prevent falls. This information is not intended to replace advice given to you by your health care provider. Make sure you discuss any questions you have with your health care provider. Document Released: 02/17/2009 Document Revised: 09/29/2015 Document Reviewed: 05/28/2014 Elsevier Interactive Patient Education  2017 [employer].

## 2022-06-22 ENCOUNTER — Other Ambulatory Visit: Payer: Self-pay | Admitting: Family Medicine

## 2022-06-22 DIAGNOSIS — G8929 Other chronic pain: Secondary | ICD-10-CM

## 2022-07-03 ENCOUNTER — Ambulatory Visit (INDEPENDENT_AMBULATORY_CARE_PROVIDER_SITE_OTHER): Payer: Medicare Other | Admitting: Family Medicine

## 2022-07-03 ENCOUNTER — Encounter: Payer: Self-pay | Admitting: Family Medicine

## 2022-07-03 VITALS — BP 132/71 | HR 55 | Temp 97.4°F | Wt 213.0 lb

## 2022-07-03 DIAGNOSIS — Z9109 Other allergy status, other than to drugs and biological substances: Secondary | ICD-10-CM

## 2022-07-03 DIAGNOSIS — R7303 Prediabetes: Secondary | ICD-10-CM

## 2022-07-03 DIAGNOSIS — G8929 Other chronic pain: Secondary | ICD-10-CM | POA: Diagnosis not present

## 2022-07-03 DIAGNOSIS — I10 Essential (primary) hypertension: Secondary | ICD-10-CM

## 2022-07-03 DIAGNOSIS — M25561 Pain in right knee: Secondary | ICD-10-CM

## 2022-07-03 DIAGNOSIS — Z8669 Personal history of other diseases of the nervous system and sense organs: Secondary | ICD-10-CM

## 2022-07-03 DIAGNOSIS — E669 Obesity, unspecified: Secondary | ICD-10-CM | POA: Diagnosis not present

## 2022-07-03 MED ORDER — LORATADINE 10 MG PO TABS: 10.0000 mg | ORAL_TABLET | Freq: Every day | ORAL | 11 refills | Status: DC

## 2022-07-03 NOTE — Patient Instructions (Signed)
Hypertension, Adult High blood pressure (hypertension) is when the force of blood pumping through the arteries is too strong. The arteries are the blood vessels that carry blood from the heart throughout the body. Hypertension forces the heart to work harder to pump blood and may cause arteries to become narrow or stiff. Untreated or uncontrolled hypertension can lead to a heart attack, heart failure, a stroke, kidney disease, and other problems. A blood pressure reading consists of a higher number over a lower number. Ideally, your blood pressure should be below 120/80. The first ("top") number is called the systolic pressure. It is a measure of the pressure in your arteries as your heart beats. The second ("bottom") number is called the diastolic pressure. It is a measure of the pressure in your arteries as the heart relaxes. What are the causes? The exact cause of this condition is not known. There are some conditions that result in high blood pressure. What increases the risk? Certain factors may make you more likely to develop high blood pressure. Some of these risk factors are under your control, including: Smoking. Not getting enough exercise or physical activity. Being overweight. Having too much fat, sugar, calories, or salt (sodium) in your diet. Drinking too much alcohol. Other risk factors include: Having a personal history of heart disease, diabetes, high cholesterol, or kidney disease. Stress. Having a family history of high blood pressure and high cholesterol. Having obstructive sleep apnea. Age. The risk increases with age. What are the signs or symptoms? High blood pressure may not cause symptoms. Very high blood pressure (hypertensive crisis) may cause: Headache. Fast or irregular heartbeats (palpitations). Shortness of breath. Nosebleed. Nausea and vomiting. Vision changes. Severe chest pain, dizziness, and seizures. How is this diagnosed? This condition is diagnosed by  measuring your blood pressure while you are seated, with your arm resting on a flat surface, your legs uncrossed, and your feet flat on the floor. The cuff of the blood pressure monitor will be placed directly against the skin of your upper arm at the level of your heart. Blood pressure should be measured at least twice using the same arm. Certain conditions can cause a difference in blood pressure between your right and left arms. If you have a high blood pressure reading during one visit or you have normal blood pressure with other risk factors, you may be asked to: Return on a different day to have your blood pressure checked again. Monitor your blood pressure at home for 1 week or longer. If you are diagnosed with hypertension, you may have other blood or imaging tests to help your health care provider understand your overall risk for other conditions. How is this treated? This condition is treated by making healthy lifestyle changes, such as eating healthy foods, exercising more, and reducing your alcohol intake. You may be referred for counseling on a healthy diet and physical activity. Your health care provider may prescribe medicine if lifestyle changes are not enough to get your blood pressure under control and if: Your systolic blood pressure is above 130. Your diastolic blood pressure is above 80. Your personal target blood pressure may vary depending on your medical conditions, your age, and other factors. Follow these instructions at home: Eating and drinking  Eat a diet that is high in fiber and potassium, and low in sodium, added sugar, and fat. An example of this eating plan is called the DASH diet. DASH stands for Dietary Approaches to Stop Hypertension. To eat this way: Eat   plenty of fresh fruits and vegetables. Try to fill one half of your plate at each meal with fruits and vegetables. Eat whole grains, such as whole-wheat pasta, brown rice, or whole-grain bread. Fill about one  fourth of your plate with whole grains. Eat or drink low-fat dairy products, such as skim milk or low-fat yogurt. Avoid fatty cuts of meat, processed or cured meats, and poultry with skin. Fill about one fourth of your plate with lean proteins, such as fish, chicken without skin, beans, eggs, or tofu. Avoid pre-made and processed foods. These tend to be higher in sodium, added sugar, and fat. Reduce your daily sodium intake. Many people with hypertension should eat less than 1,500 mg of sodium a day. Do not drink alcohol if: Your health care provider tells you not to drink. You are pregnant, may be pregnant, or are planning to become pregnant. If you drink alcohol: Limit how much you have to: 0-1 drink a day for women. 0-2 drinks a day for men. Know how much alcohol is in your drink. In the [place] one drink equals one 12 oz bottle of beer (355 mL), one 5 oz glass of wine (148 mL), or one 1 oz glass of hard liquor (44 mL). Lifestyle  Work with your health care provider to maintain a healthy body weight or to lose weight. Ask what an ideal weight is for you. Get at least 30 minutes of exercise that causes your heart to beat faster (aerobic exercise) most days of the week. Activities may include walking, swimming, or biking. Include exercise to strengthen your muscles (resistance exercise), such as Pilates or lifting weights, as part of your weekly exercise routine. Try to do these types of exercises for 30 minutes at least 3 days a week. Do not use any products that contain nicotine or tobacco. These products include cigarettes, chewing tobacco, and vaping devices, such as e-cigarettes. If you need help quitting, ask your health care provider. Monitor your blood pressure at home as told by your health care provider. Keep all follow-up visits. This is important. Medicines Take over-the-counter and prescription medicines only as told by your health care provider. Follow directions carefully. Blood  pressure medicines must be taken as prescribed. Do not skip doses of blood pressure medicine. Doing this puts you at risk for problems and can make the medicine less effective. Ask your health care provider about side effects or reactions to medicines that you should watch for. Contact a health care provider if you: Think you are having a reaction to a medicine you are taking. Have headaches that keep coming back (recurring). Feel dizzy. Have swelling in your ankles. Have trouble with your vision. Get help right away if you: Develop a severe headache or confusion. Have unusual weakness or numbness. Feel faint. Have severe pain in your chest or abdomen. Vomit repeatedly. Have trouble breathing. These symptoms may be an emergency. Get help right away. Call 911. Do not wait to see if the symptoms will go away. Do not drive yourself to the hospital. Summary Hypertension is when the force of blood pumping through your arteries is too strong. If this condition is not controlled, it may put you at risk for serious complications. Your personal target blood pressure may vary depending on your medical conditions, your age, and other factors. For most people, a normal blood pressure is less than 120/80. Hypertension is treated with lifestyle changes, medicines, or a combination of both. Lifestyle changes include losing weight, eating a healthy,   low-sodium diet, exercising more, and limiting alcohol. This information is not intended to replace advice given to you by your health care provider. Make sure you discuss any questions you have with your health care provider. Document Revised: 02/28/2021 Document Reviewed: 02/28/2021 Elsevier Patient Education  2023 [employer]  

## 2022-07-03 NOTE — Progress Notes (Signed)
Established Patient Office Visit  Subjective   Patient ID: Eileen Morgan, female    DOB: [birth year]  Age: 72 y.o. MRN: [id number]  Chief Complaint  Patient presents with   Hypertension    Follow up    Eileen Morgan is a very pleasant 72 year old female with a medical history significant for hypertension, obesity, history of COPD, and history of seizure disorder presents for follow-up of chronic conditions.  Patient states that she has been doing well and does not have any complaints on today. She has been taking all medications consistently and without interruption.  Hypertension This is a chronic problem. The problem is controlled. Pertinent negatives include no anxiety, blurred vision, chest pain, headaches, neck pain, orthopnea, palpitations, PND, shortness of breath or sweats. Risk factors for coronary artery disease include obesity and sedentary lifestyle. Past treatments include beta blockers and diuretics. There is no history of kidney disease, CAD/MI, heart failure, left ventricular hypertrophy or retinopathy.    Patient Active Problem List   Diagnosis Date Noted   History of seizure disorder 06/21/2019   Tobacco dependence in remission 06/21/2019   Depression 06/21/2019   Bladder leak 06/21/2019   GERD without esophagitis 06/21/2019   Anemia of chronic disease 06/21/2019   COPD with acute exacerbation ([place]) 03/21/2019   Chronic respiratory failure with hypoxia ([place]) 03/20/2019   Seizure ([place]) 03/20/2019   Past Medical History:  Diagnosis Date   COPD (chronic obstructive pulmonary disease) ([place])    Pulmonary embolism ([facility])    Seizures ([place])    Past Surgical History:  Procedure Laterality Date   BIOPSY  01/12/2021   Procedure: BIOPSY;  Surgeon: Irving Copas., MD;  Location: [facility];  Service: Gastroenterology;;  EGD and COLON   COLONOSCOPY WITH PROPOFOL N/A 01/12/2021   Procedure: COLONOSCOPY WITH PROPOFOL;  Surgeon: Irving Copas., MD;  Location: [facility];  Service: Gastroenterology;  Laterality: N/A;   ESOPHAGOGASTRODUODENOSCOPY (EGD) WITH PROPOFOL N/A 01/12/2021   Procedure: ESOPHAGOGASTRODUODENOSCOPY (EGD) WITH PROPOFOL;  Surgeon: Rush Landmark Telford Nab., MD;  Location: [facility];  Service: Gastroenterology;  Laterality: N/A;   POLYPECTOMY  01/12/2021   Procedure: POLYPECTOMY;  Surgeon: Mansouraty, Telford Nab., MD;  Location: [facility] ENDOSCOPY;  Service: Gastroenterology;;   TONSILLECTOMY     TUBAL LIGATION     Social History   Tobacco Use   Smoking status: Former    Packs/day: 1.00    Years: 40.00    Total pack years: 40.00    Types: Cigarettes    Quit date: 03/08/2019    Years since quitting: 3.3   Smokeless tobacco: Never  Vaping Use   Vaping Use: Never used  Substance Use Topics   Alcohol use: Not Currently   Drug use: Never   Social History   Socioeconomic History   Marital status: Single    Spouse name: Not on file   Number of children: Not on file   Years of education: Not on file   Highest education level: Not on file  Occupational History   Not on file  Tobacco Use   Smoking status: Former    Packs/day: 1.00    Years: 40.00    Total pack years: 40.00    Types: Cigarettes    Quit date: 03/08/2019    Years since quitting: 3.3   Smokeless tobacco: Never  Vaping Use   Vaping Use: Never used  Substance and Sexual Activity   Alcohol use: Not Currently   Drug use: Never   Sexual activity: Not on file  Other Topics Concern   Not on file  Social History Narrative   Not on file   Social Determinants of Health   Financial Resource Strain: Low Risk  (05/17/2022)   Overall Financial Resource Strain (CARDIA)    Difficulty of Paying Living Expenses: Not hard at all  Food Insecurity: No Food Insecurity (05/17/2022)   Hunger Vital Sign    Worried About Running Out of Food in the Last Year: Never true    Ran Out of Food in the Last Year: Never true  Transportation Needs: No Transportation Needs (05/17/2022)    PRAPARE - Hydrologist (Medical): No    Lack of Transportation (Non-Medical): No  Physical Activity: Insufficiently Active (05/17/2022)   Exercise Vital Sign    Days of Exercise per Week: 3 days    Minutes of Exercise per Session: 30 min  Stress: No Stress Concern Present (05/17/2022)   [place]    Feeling of Stress : Not at all  Social Connections: Socially Isolated (05/17/2022)   Social Connection and Isolation Panel [NHANES]    Frequency of Communication with Friends and Family: More than three times a week    Frequency of Social Gatherings with Friends and Family: More than three times a week    Attends Religious Services: Never    Marine scientist or Organizations: No    Attends Archivist Meetings: Never    Marital Status: Divorced  Human resources officer Violence: Not At Risk (05/17/2022)   Humiliation, Afraid, Rape, and Kick questionnaire    Fear of Current or Ex-Partner: No    Emotionally Abused: No    Physically Abused: No    Sexually Abused: No   Family Status  Relation Name Status   Father  (Not Specified)   Brother  (Not Specified)   Neg Hx  (Not Specified)   Family History  Problem Relation Age of Onset   COPD Father    Emphysema Brother    CAD Neg Hx    Diabetes Mellitus II Neg Hx    Allergies  Allergen Reactions   Penicillins Anaphylaxis      Review of Systems  Constitutional: Negative.   HENT: Negative.    Eyes: Negative.  Negative for blurred vision.  Respiratory: Negative.  Negative for shortness of breath.   Cardiovascular: Negative.  Negative for chest pain, palpitations, orthopnea and PND.  Gastrointestinal: Negative.   Genitourinary: Negative.   Musculoskeletal: Negative.  Negative for neck pain.  Skin: Negative.   Neurological: Negative.  Negative for headaches.  Psychiatric/Behavioral: Negative.        Objective:     BP 132/71    Pulse (!) 55   Temp (!) 97.4 F (36.3 C)   Wt 213 lb (96.6 kg)   SpO2 97%   BMI 36.56 kg/m  BP Readings from Last 3 Encounters:  07/03/22 132/71  03/27/22 (!) 142/69  11/14/21 130/67   Wt Readings from Last 3 Encounters:  07/03/22 213 lb (96.6 kg)  05/17/22 211 lb (95.7 kg)  03/27/22 210 lb 3.2 oz (95.3 kg)      Physical Exam Constitutional:      Appearance: She is obese.  Eyes:     Pupils: Pupils are equal, round, and reactive to light.  Cardiovascular:     Rate and Rhythm: Normal rate and regular rhythm.     Pulses: Normal pulses.  Pulmonary:     Effort: Pulmonary effort  is normal.  Abdominal:     General: Bowel sounds are normal.  Musculoskeletal:        General: Normal range of motion.  Skin:    General: Skin is warm.  Neurological:     General: No focal deficit present.     Mental Status: She is alert. Mental status is at baseline.  Psychiatric:        Mood and Affect: Mood normal.        Behavior: Behavior normal.        Thought Content: Thought content normal.        Judgment: Judgment normal.      No results found for any visits on 07/03/22.  Last CBC Lab Results  Component Value Date   WBC 6.2 06/16/2019   HGB 13.5 06/16/2019   HCT 41.9 06/16/2019   MCV 92 06/16/2019   MCH 29.6 06/16/2019   RDW 12.3 06/16/2019   PLT 240 0000000   Last metabolic panel Lab Results  Component Value Date   GLUCOSE 101 (H) 03/27/2022   NA 140 03/27/2022   K 4.1 03/27/2022   CL 100 03/27/2022   CO2 24 03/27/2022   BUN 21 03/27/2022   CREATININE 0.83 03/27/2022   EGFR 74 03/27/2022   CALCIUM 9.4 03/27/2022   PROT 6.6 11/14/2021   ALBUMIN 4.2 11/14/2021   LABGLOB 2.4 11/14/2021   AGRATIO 1.8 11/14/2021   BILITOT 0.3 11/14/2021   ALKPHOS 68 11/14/2021   AST 18 11/14/2021   ALT 23 11/14/2021   ANIONGAP 9 03/21/2019   Last lipids Lab Results  Component Value Date   CHOL 186 11/14/2021   HDL 66 11/14/2021   LDLCALC 91 11/14/2021   TRIG 169 (H)  11/14/2021   CHOLHDL 2.8 11/14/2021   Last hemoglobin A1c Lab Results  Component Value Date   HGBA1C 5.8 (A) 03/27/2022   Last thyroid functions Lab Results  Component Value Date   TSH 2.850 03/27/2022   Last vitamin D No results found for: "25OHVITD2", "25OHVITD3", "VD25OH" Last vitamin B12 and Folate No results found for: "VITAMINB12", "FOLATE"    The 10-year ASCVD risk score (Arnett DK, et al., 2019) is: 17.8%    Assessment & Plan:   Problem List Items Addressed This Visit       Other   History of seizure disorder   Other Visit Diagnoses     Essential hypertension    -  Primary   Relevant Orders   Lipid Panel   CMP and Liver   Obesity (BMI 35.0-39.9 without comorbidity)       Chronic pain of right knee       Prediabetes       Relevant Orders   HgB A1c   CMP and Liver   Environmental allergies       Relevant Medications   loratadine (CLARITIN) 10 MG tablet     1. Essential hypertension BP 132/71   Pulse (!) 55   Temp (!) 97.4 F (36.3 C)   Wt 213 lb (96.6 kg)   SpO2 97%   BMI 36.56 kg/m   - Lipid Panel - CMP and Liver  2. Obesity (BMI 35.0-39.9 without comorbidity) The patient is asked to make an attempt to improve diet and exercise patterns to aid in medical management of this problem.   3. History of seizure disorder Stable. No recent seizure activity. Continue to follow up with neurology yearly  4. Chronic pain of right knee   5. Prediabetes  -  HgB A1c - CMP and Liver  6. Environmental allergies  - loratadine (CLARITIN) 10 MG tablet; Take 1 tablet (10 mg total) by mouth daily.  Dispense: 30 tablet; Refill: 11   Return in about 6 months (around 01/01/2023) for hypertension.   Donia Pounds  APRN, MSN, FNP-C Patient [place] [address] [place], [place] [address] [phone]

## 2022-07-04 LAB — CMP AND LIVER
ALT: 20 IU/L (ref 0–32)
AST: 18 IU/L (ref 0–40)
Albumin: 4.2 g/dL (ref 3.8–4.8)
Alkaline Phosphatase: 66 IU/L (ref 44–121)
BUN: 18 mg/dL (ref 8–27)
Bilirubin Total: 0.4 mg/dL (ref 0.0–1.2)
Bilirubin, Direct: 0.12 mg/dL (ref 0.00–0.40)
CO2: 26 mmol/L (ref 20–29)
Calcium: 9.4 mg/dL (ref 8.7–10.3)
Chloride: 102 mmol/L (ref 96–106)
Creatinine, Ser: 0.83 mg/dL (ref 0.57–1.00)
Glucose: 87 mg/dL (ref 70–99)
Potassium: 3.9 mmol/L (ref 3.5–5.2)
Sodium: 142 mmol/L (ref 134–144)
Total Protein: 6 g/dL (ref 6.0–8.5)
eGFR: 74 mL/min/{1.73_m2} (ref >59)

## 2022-07-04 LAB — LIPID PANEL
Chol/HDL Ratio: 3.1 ratio (ref 0.0–4.4)
Cholesterol, Total: 173 mg/dL (ref 100–199)
HDL: 55 mg/dL (ref >39)
LDL Chol Calc (NIH): 89 mg/dL (ref 0–99)
Triglycerides: 173 mg/dL — ABNORMAL HIGH (ref 0–149)
VLDL Cholesterol Cal: 29 mg/dL (ref 5–40)

## 2022-07-04 NOTE — Progress Notes (Signed)
Eileen Morgan is a 72 year old female with a medical history significant for essential hypertension, hypertriglyceridemia, and history of seizure disorder was evaluated in clinic on 07/03/2022 for chronic conditions. Reviewed cholesterol panel, triglycerides remain elevated at 173, the goal was to be less than 150.  Recommend 30 minutes of low impact cardiovascular exercise 3 days/week.  This could consist of walking throughout her neighborhood as discussed.  Also, recommend a low-fat, low carbohydrate diet divided over small meals throughout the day.  Patient may benefit from a dietitian/nutritionist consult.  Donia Pounds  APRN, MSN, FNP-C Patient [place] [address] [place], [place] [address] [phone]

## 2022-07-13 ENCOUNTER — Other Ambulatory Visit: Payer: Self-pay | Admitting: Family Medicine

## 2022-07-13 DIAGNOSIS — N3281 Overactive bladder: Secondary | ICD-10-CM

## 2022-07-13 DIAGNOSIS — N3289 Other specified disorders of bladder: Secondary | ICD-10-CM

## 2022-07-13 NOTE — Telephone Encounter (Signed)
Please advise KH 

## 2022-07-20 IMAGING — MG MM DIGITAL DIAGNOSTIC UNILAT*L* W/ TOMO W/ CAD
6 series · 6 of 18 positions shown · non-contrast
Comparison: Previous exam(s).

CLINICAL DATA: 70-year-old female for six-month follow-up of OUTER
LEFT breast asymmetry, previously without sonographic correlate.

EXAM:
DIGITAL DIAGNOSTIC UNILATERAL LEFT MAMMOGRAM WITH TOMOSYNTHESIS AND
CAD
TECHNIQUE: Left digital diagnostic mammography and breast tomosynthesis was
performed. The images were evaluated with computer-aided detection.

[L CC synth-2D (1 of 2)]
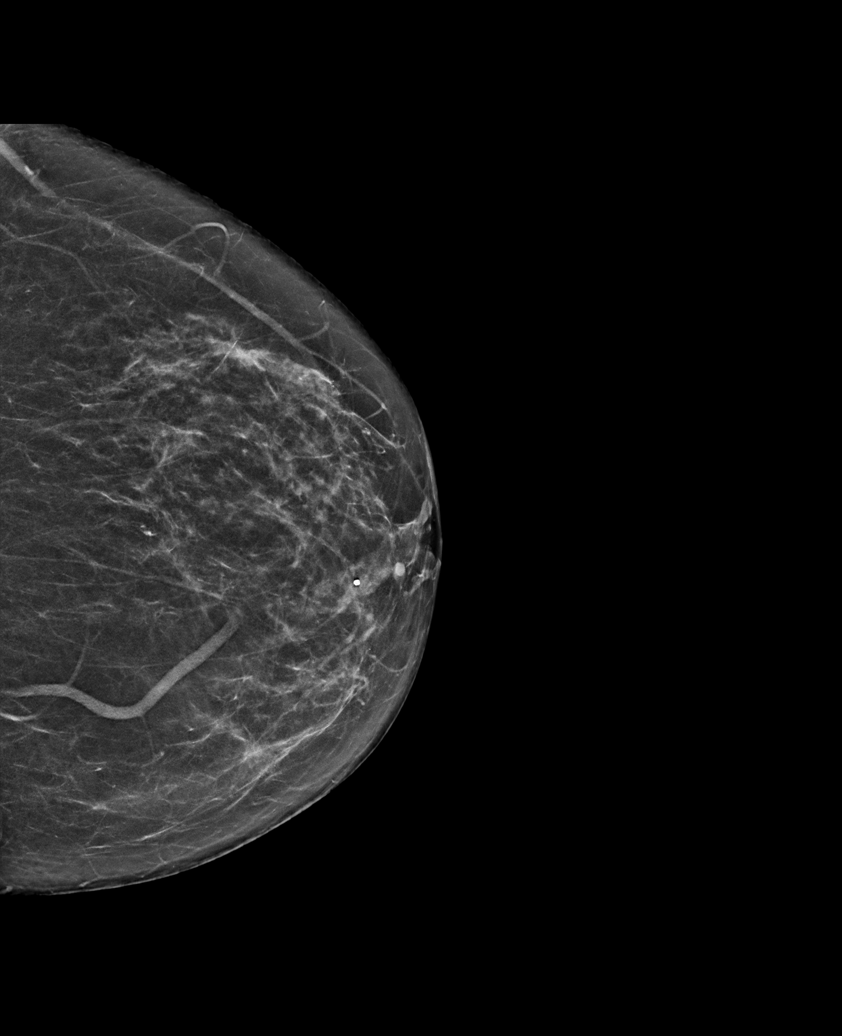

[L CC synth-2D (2 of 2)]
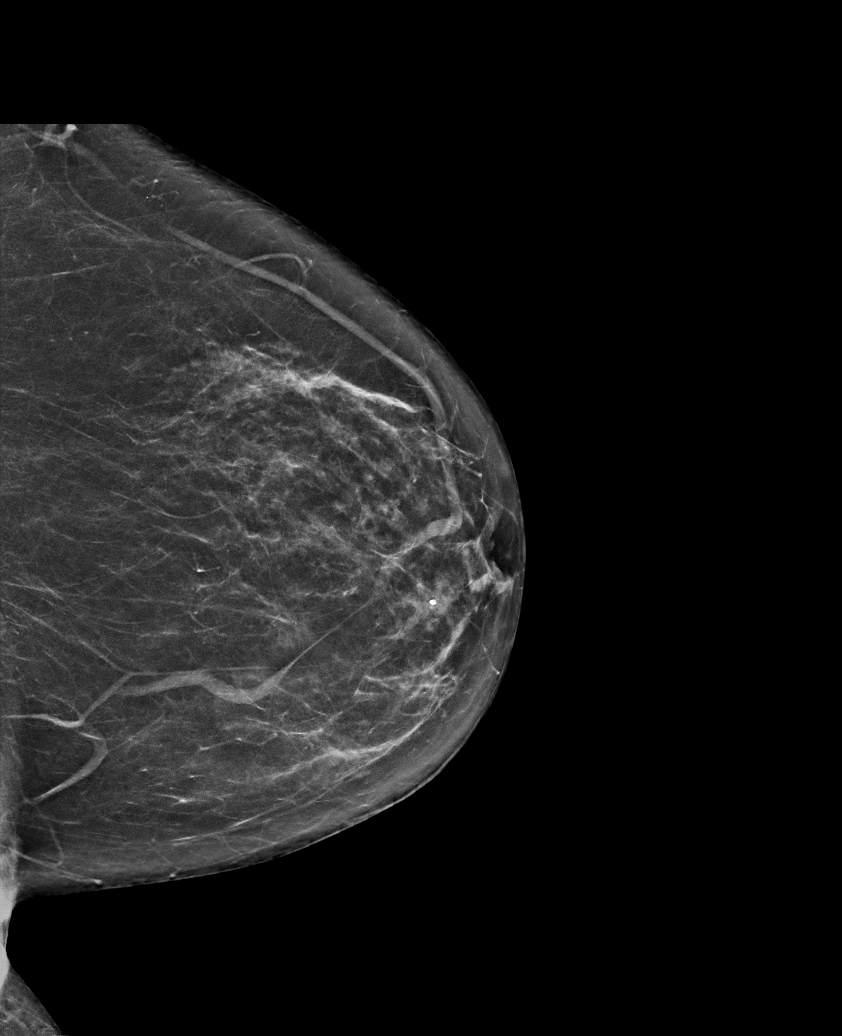

[L MLO synth-2D]
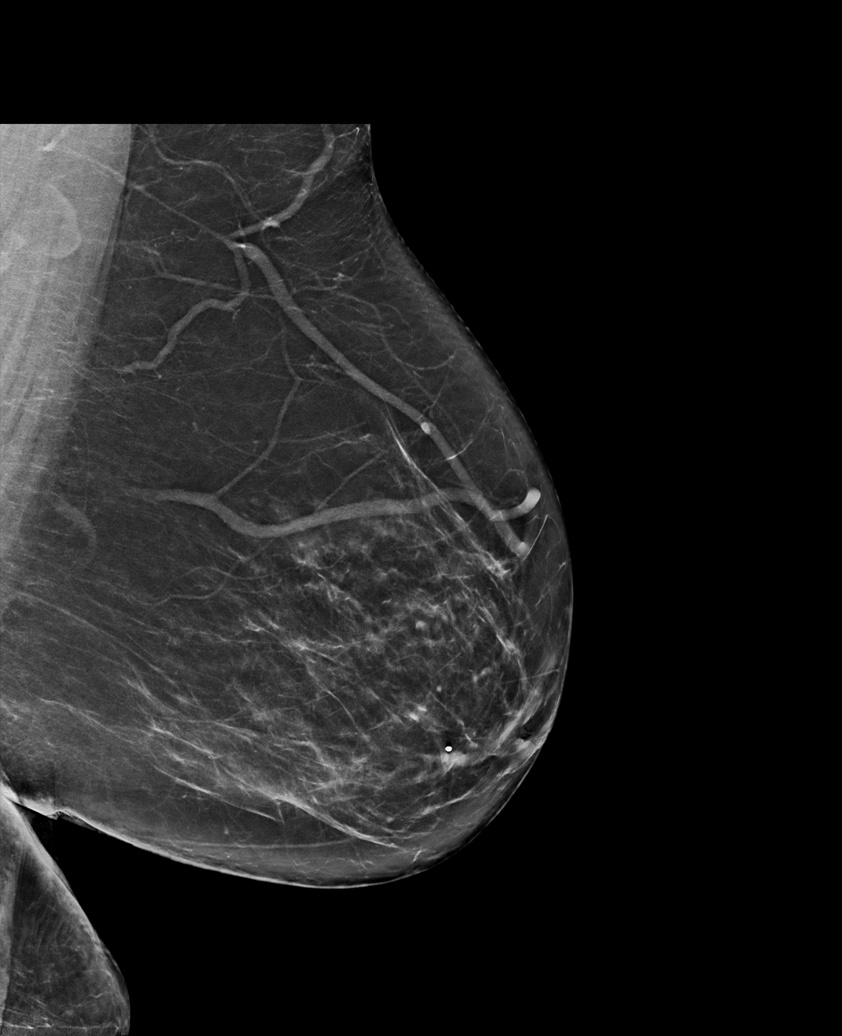

[L CC tomo (1 of 2) · tomo slice 35/68.0]
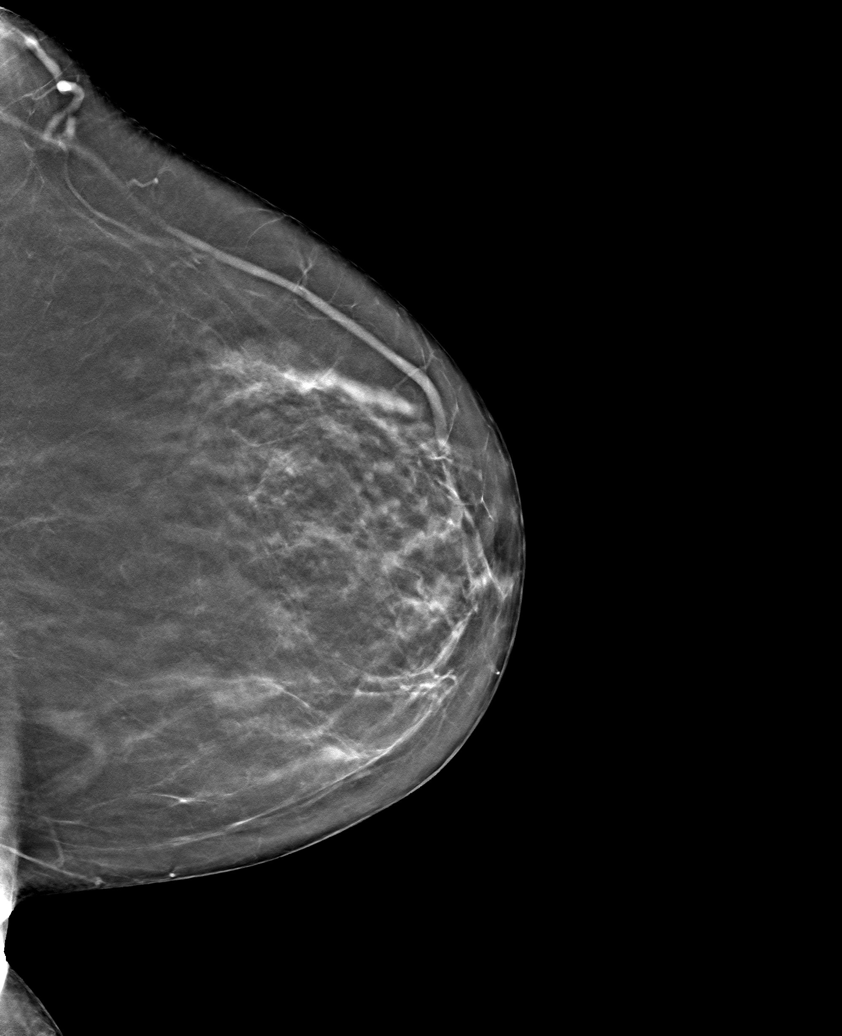

[L CC tomo (2 of 2) · tomo slice 32/63.0]
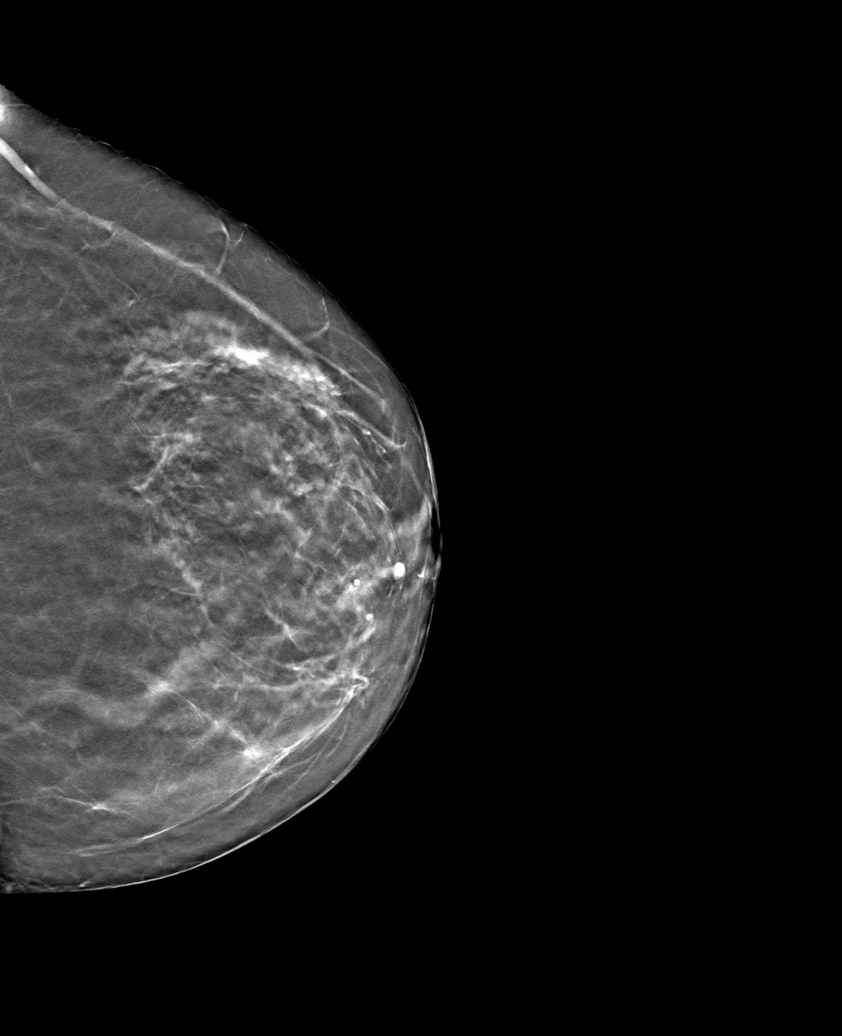

[L MLO tomo · tomo slice 38/75.0]
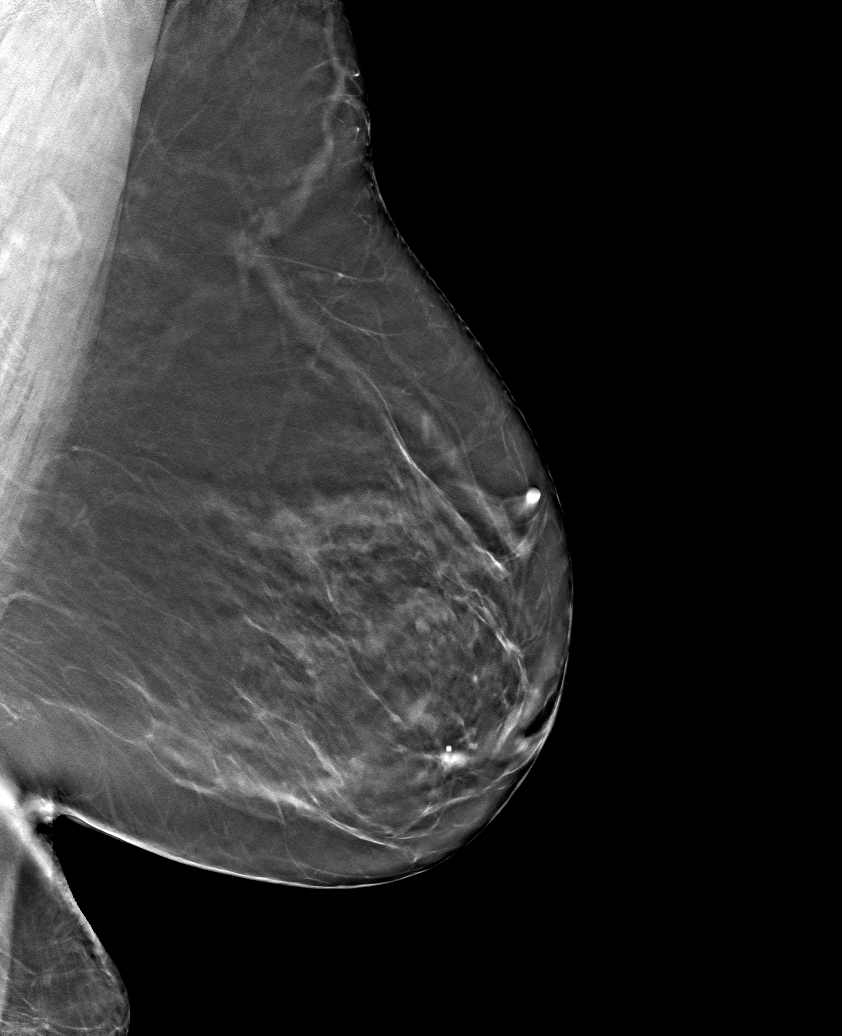

[6 of 18 positions shown; findings below may reference images not displayed]

ACR Breast Density Category b: There are scattered areas of
fibroglandular density.
FINDINGS: Full field views of the LEFT breast demonstrate no significant
change in the OUTER LEFT breast asymmetry.

No new or suspicious LEFT breast findings are noted.
IMPRESSION: Unchanged OUTER LEFT breast asymmetry, previously without
sonographic correlate. Six-month follow-up recommended to ensure 1
year stability.

RECOMMENDATION:
Bilateral diagnostic mammogram with possible LEFT breast ultrasound
in 6 months.

I have discussed the findings and recommendations with the patient.
If applicable, a reminder letter will be sent to the patient
regarding the next appointment.

BI-RADS CATEGORY  3: Probably benign.

## 2022-08-13 ENCOUNTER — Other Ambulatory Visit: Payer: Self-pay | Admitting: Family Medicine

## 2022-08-13 DIAGNOSIS — I10 Essential (primary) hypertension: Secondary | ICD-10-CM

## 2022-08-13 DIAGNOSIS — F32A Depression, unspecified: Secondary | ICD-10-CM

## 2022-08-20 DIAGNOSIS — J069 Acute upper respiratory infection, unspecified: Secondary | ICD-10-CM | POA: Diagnosis not present

## 2022-08-20 DIAGNOSIS — J029 Acute pharyngitis, unspecified: Secondary | ICD-10-CM | POA: Diagnosis not present

## 2022-08-20 DIAGNOSIS — Z20822 Contact with and (suspected) exposure to covid-19: Secondary | ICD-10-CM | POA: Diagnosis not present

## 2022-08-26 ENCOUNTER — Other Ambulatory Visit: Payer: Self-pay | Admitting: Family Medicine

## 2022-08-26 DIAGNOSIS — Z8669 Personal history of other diseases of the nervous system and sense organs: Secondary | ICD-10-CM

## 2022-08-27 NOTE — Telephone Encounter (Signed)
Please advise KH 

## 2022-10-16 ENCOUNTER — Other Ambulatory Visit: Payer: Self-pay | Admitting: Family Medicine

## 2022-10-16 DIAGNOSIS — K219 Gastro-esophageal reflux disease without esophagitis: Secondary | ICD-10-CM

## 2022-10-19 ENCOUNTER — Telehealth: Payer: Self-pay

## 2022-10-19 NOTE — Telephone Encounter (Signed)
Pt is requesting to have phentermine filled due to wegovy not being covered. Please advise. KH

## 2022-10-22 NOTE — Telephone Encounter (Signed)
Sent my chart message to pt. Kh

## 2022-10-28 ENCOUNTER — Other Ambulatory Visit: Payer: Self-pay | Admitting: Family Medicine

## 2022-10-28 DIAGNOSIS — G8929 Other chronic pain: Secondary | ICD-10-CM

## 2022-11-16 ENCOUNTER — Other Ambulatory Visit: Payer: Self-pay | Admitting: Nurse Practitioner

## 2022-11-16 ENCOUNTER — Other Ambulatory Visit: Payer: Self-pay | Admitting: Internal Medicine

## 2022-11-16 DIAGNOSIS — F32A Depression, unspecified: Secondary | ICD-10-CM

## 2022-11-16 DIAGNOSIS — N3281 Overactive bladder: Secondary | ICD-10-CM

## 2022-11-16 DIAGNOSIS — N3289 Other specified disorders of bladder: Secondary | ICD-10-CM

## 2022-11-19 NOTE — Telephone Encounter (Signed)
Please advise Kh 

## 2022-12-03 ENCOUNTER — Ambulatory Visit
Admission: RE | Admit: 2022-12-03 | Discharge: 2022-12-03 | Disposition: A | Discharge status: Home / Self Care | Payer: Medicare Other | Source: Ambulatory Visit | Attending: Family Medicine | Admitting: Family Medicine

## 2022-12-03 DIAGNOSIS — N958 Other specified menopausal and perimenopausal disorders: Secondary | ICD-10-CM | POA: Diagnosis not present

## 2022-12-03 DIAGNOSIS — Z780 Asymptomatic menopausal state: Secondary | ICD-10-CM

## 2022-12-03 DIAGNOSIS — J449 Chronic obstructive pulmonary disease, unspecified: Secondary | ICD-10-CM | POA: Diagnosis not present

## 2022-12-03 DIAGNOSIS — E349 Endocrine disorder, unspecified: Secondary | ICD-10-CM | POA: Diagnosis not present

## 2023-01-01 ENCOUNTER — Encounter: Payer: Self-pay | Admitting: Family Medicine

## 2023-01-01 ENCOUNTER — Ambulatory Visit (INDEPENDENT_AMBULATORY_CARE_PROVIDER_SITE_OTHER): Payer: Medicare Other | Admitting: Family Medicine

## 2023-01-01 VITALS — BP 137/64 | HR 57 | Temp 97.1°F | Wt 215.2 lb

## 2023-01-01 DIAGNOSIS — Z23 Encounter for immunization: Secondary | ICD-10-CM | POA: Diagnosis not present

## 2023-01-01 DIAGNOSIS — R7303 Prediabetes: Secondary | ICD-10-CM

## 2023-01-01 DIAGNOSIS — D229 Melanocytic nevi, unspecified: Secondary | ICD-10-CM | POA: Diagnosis not present

## 2023-01-01 DIAGNOSIS — E669 Obesity, unspecified: Secondary | ICD-10-CM | POA: Diagnosis not present

## 2023-01-01 DIAGNOSIS — Z8669 Personal history of other diseases of the nervous system and sense organs: Secondary | ICD-10-CM | POA: Diagnosis not present

## 2023-01-01 DIAGNOSIS — I10 Essential (primary) hypertension: Secondary | ICD-10-CM

## 2023-01-01 LAB — POCT GLYCOSYLATED HEMOGLOBIN (HGB A1C): Hemoglobin A1C: 5.7 % — AB (ref 4.0–5.6)

## 2023-01-01 MED ORDER — SEMAGLUTIDE(0.25 OR 0.5MG/DOS) 2 MG/3ML ~~LOC~~ SOPN: 0.2500 mg | PEN_INJECTOR | SUBCUTANEOUS | 2 refills | Status: DC

## 2023-01-01 NOTE — Progress Notes (Signed)
Established Patient Office Visit  Subjective   Patient ID: Eileen Morgan, female    DOB: 1950/09/17  Age: 72 y.o. MRN: [id number]  Chief Complaint  Patient presents with   Follow-up   Weight Loss    Eileen Morgan is a very pleasant 72 year old female with a medical history significant for essential hypertension, prediabetes, obesity with BMI greater than 35, history of seizure disorder, and former tobacco dependence presents for follow-up of her chronic conditions. Patient has history of hypertension that has been well-controlled.  She has been taking hydrochlorothiazide consistently.  She does not check her blood pressure at home.  The patient does report significant weight gain over the past several months.  Weight gain persists despite increasing daily exercise and decreasing food intake.  The patient has replaced sweet drinks with water.  Patient has prediabetes, her most recent hemoglobin A1c was 5.8.  Patient was unable to tolerate metformin.  She was previously prescribed semaglutide, however patient was unable to pick up medication due to insurance constraints.  Eileen Morgan has a complaint of a mole to left face that has changed in color over the past year.  She also states that the skin eruption has gotten larger.  She characterizes it as painless.  Patient does not have a family history of skin cancer.  She does wear sunscreen occasionally.   Patient Active Problem List   Diagnosis Date Noted   History of seizure disorder 06/21/2019   Tobacco dependence in remission 06/21/2019   Depression 06/21/2019   Bladder leak 06/21/2019   GERD without esophagitis 06/21/2019   Anemia of chronic disease 06/21/2019   COPD with acute exacerbation ([facility]) 03/21/2019   Chronic respiratory failure with hypoxia ([facility]) 03/20/2019   Seizure ([facility]) 03/20/2019   Past Medical History:  Diagnosis Date   COPD (chronic obstructive pulmonary disease) ([facility])    Pulmonary embolism ([facility])    Seizures ([facility])     Past Surgical History:  Procedure Laterality Date   BIOPSY  01/12/2021   Procedure: BIOPSY;  Surgeon: Lemar Lofty., MD;  Location[facility] ENDOSCOPY;  Service: Gastroenterology;;  EGD and COLON   COLONOSCOPY WITH PROPOFOL N/A 01/12/2021   Procedure: COLONOSCOPY WITH PROPOFOL;  Surgeon: Lemar Lofty., MD;  Location: [facility] ENDOSCOPY;  Service: Gastroenterology;  Laterality: N/A;   ESOPHAGOGASTRODUODENOSCOPY (EGD) WITH PROPOFOL N/A 01/12/2021   Procedure: ESOPHAGOGASTRODUODENOSCOPY (EGD) WITH PROPOFOL;  Surgeon: Meridee Score Netty Starring., MD;  Location: [facility] ENDOSCOPY;  Service: Gastroenterology;  Laterality: N/A;   POLYPECTOMY  01/12/2021   Procedure: POLYPECTOMY;  Surgeon: Mansouraty, Netty Starring., MD;  Location: [facility] ENDOSCOPY;  Service: Gastroenterology;;   TONSILLECTOMY     TUBAL LIGATION     Social History   Tobacco Use   Smoking status: Former    Current packs/day: 0.00    Average packs/day: 1 pack/day for 40.0 years (40.0 ttl pk-yrs)    Types: Cigarettes    Start date: 03/08/1979    Quit date: 03/08/2019    Years since quitting: 3.8   Smokeless tobacco: Never  Vaping Use   Vaping status: Never Used  Substance Use Topics   Alcohol use: Not Currently   Drug use: Never   Social History   Socioeconomic History   Marital status: Single    Spouse name: Not on file   Number of children: Not on file   Years of education: Not on file   Highest education level: Not on file  Occupational History   Not on file  Tobacco Use  Smoking status: Former    Current packs/day: 0.00    Average packs/day: 1 pack/day for 40.0 years (40.0 ttl pk-yrs)    Types: Cigarettes    Start date: 03/08/1979    Quit date: 03/08/2019    Years since quitting: 3.8   Smokeless tobacco: Never  Vaping Use   Vaping status: Never Used  Substance and Sexual Activity   Alcohol use: Not Currently   Drug use: Never   Sexual activity: Not on file  Other Topics Concern   Not on file  Social History  Narrative   Not on file   Social Determinants of Health   Financial Resource Strain: Low Risk  (05/17/2022)   Overall Financial Resource Strain (CARDIA)    Difficulty of Paying Living Expenses: Not hard at all  Food Insecurity: No Food Insecurity (05/17/2022)   Hunger Vital Sign    Worried About Running Out of Food in the Last Year: Never true    Ran Out of Food in the Last Year: Never true  Transportation Needs: No Transportation Needs (05/17/2022)   PRAPARE - Administrator, Civil Service (Medical): No    Lack of Transportation (Non-Medical): No  Physical Activity: Insufficiently Active (05/17/2022)   Exercise Vital Sign    Days of Exercise per Week: 3 days    Minutes of Exercise per Session: 30 min  Stress: No Stress Concern Present (05/17/2022)   [employer] of Occupational Health - Occupational Stress Questionnaire    Feeling of Stress : Not at all  Social Connections: Socially Isolated (05/17/2022)   Social Connection and Isolation Panel [NHANES]    Frequency of Communication with Friends and Family: More than three times a week    Frequency of Social Gatherings with Friends and Family: More than three times a week    Attends Religious Services: Never    Database administrator or Organizations: No    Attends Banker Meetings: Never    Marital Status: Divorced  Catering manager Violence: Not At Risk (05/17/2022)   Humiliation, Afraid, Rape, and Kick questionnaire    Fear of Current or Ex-Partner: No    Emotionally Abused: No    Physically Abused: No    Sexually Abused: No   Family Status  Relation Name Status   Father  (Not Specified)   Brother  (Not Specified)   Neg Hx  (Not Specified)  No partnership data on file   Family History  Problem Relation Age of Onset   COPD Father    Emphysema Brother    CAD Neg Hx    Diabetes Mellitus II Neg Hx    Allergies  Allergen Reactions   Penicillins Anaphylaxis      Review of Systems   Constitutional: Negative.   HENT: Negative.    Respiratory: Negative.    Cardiovascular: Negative.   Gastrointestinal: Negative.   Genitourinary: Negative.   Skin: Negative.   Neurological: Negative.   Psychiatric/Behavioral: Negative.        Objective:     BP 137/64   Pulse (!) 57   Temp (!) 97.1 F (36.2 C)   Wt 215 lb 3.2 oz (97.6 kg)   SpO2 96%   BMI 36.94 kg/m  BP Readings from Last 3 Encounters:  01/01/23 137/64  07/03/22 132/71  03/27/22 (!) 142/69   Wt Readings from Last 3 Encounters:  01/01/23 215 lb 3.2 oz (97.6 kg)  07/03/22 213 lb (96.6 kg)  05/17/22 211 lb (95.7 kg)  Physical Exam Constitutional:      Appearance: She is obese.  Eyes:     Pupils: Pupils are equal, round, and reactive to light.  Pulmonary:     Effort: Pulmonary effort is normal.     Breath sounds: Normal breath sounds.  Abdominal:     General: Bowel sounds are normal.  Musculoskeletal:        General: Normal range of motion.  Skin:    General: Skin is warm.     Findings: Lesion (mole, brown raised, rough) present.  Neurological:     General: No focal deficit present.     Mental Status: Mental status is at baseline.  Psychiatric:        Mood and Affect: Mood normal.        Behavior: Behavior normal.        Thought Content: Thought content normal.        Judgment: Judgment normal.     No results found for any visits on 01/01/23.  Last CBC Lab Results  Component Value Date   WBC 6.2 06/16/2019   HGB 13.5 06/16/2019   HCT 41.9 06/16/2019   MCV 92 06/16/2019   MCH 29.6 06/16/2019   RDW 12.3 06/16/2019   PLT 240 06/16/2019      The 10-year ASCVD risk score (Arnett DK, et al., 2019) is: 19%    Assessment & Plan:   Problem List Items Addressed This Visit       Other   History of seizure disorder   Other Visit Diagnoses     Essential hypertension    -  Primary   Relevant Orders   Comprehensive metabolic panel   Obesity (BMI 95.2-84.1 without  comorbidity)       Relevant Medications   Semaglutide,0.25 or 0.5MG /DOS, 2 MG/3ML SOPN   Other Relevant Orders   Comprehensive metabolic panel   Prediabetes       Relevant Medications   Semaglutide,0.25 or 0.5MG /DOS, 2 MG/3ML SOPN   Other Relevant Orders   HgB A1c   Comprehensive metabolic panel   32-GMWNUU pneumococcal polysaccharide vaccine administered       Relevant Orders   Pneumococcal polysaccharide vaccine 23-valent greater than or equal to 2yo subcutaneous/IM   Change in color of skin mole       Relevant Orders   Ambulatory referral to Dermatology       Return in about 6 months (around 07/04/2023) for obesity, hypertension.  1. Essential hypertension BP 137/64   Pulse (!) 57   Temp (!) 97.1 F (36.2 C)   Wt 215 lb 3.2 oz (97.6 kg)   SpO2 96%   BMI 36.94 kg/m   - Comprehensive metabolic panel  2. Obesity (BMI 35.0-39.9 without comorbidity) The patient is asked to make an attempt to improve diet and exercise patterns to aid in medical management of this problem.  - Semaglutide,0.25 or 0.5MG /DOS, 2 MG/3ML SOPN; Inject 0.25 mg into the skin once a week.  Dispense: 3 mL; Refill: 2 - Comprehensive metabolic panel  3. History of seizure disorder Follow-up with neurology  4. Prediabetes  - HgB A1c - Semaglutide,0.25 or 0.5MG /DOS, 2 MG/3ML SOPN; Inject 0.25 mg into the skin once a week.  Dispense: 3 mL; Refill: 2 - Comprehensive metabolic panel  5. 23-valent pneumococcal polysaccharide vaccine administered  - Pneumococcal polysaccharide vaccine 23-valent greater than or equal to 2yo subcutaneous/IM  6. Change in color of skin mole Patient is very concerned that mole has been changing in color over  the past year.  Patient warrants a referral to dermatology for further workup and evaluation.  - Ambulatory referral to Dermatology     Nolon Nations  APRN, MSN, FNP-C Patient Care [facility] [address] [place], [place]  [address] [phone]

## 2023-01-02 LAB — COMPREHENSIVE METABOLIC PANEL
ALT: 25 IU/L (ref 0–32)
AST: 21 IU/L (ref 0–40)
Albumin: 4.3 g/dL (ref 3.8–4.8)
Alkaline Phosphatase: 79 IU/L (ref 44–121)
BUN/Creatinine Ratio: 17 (ref 12–28)
BUN: 16 mg/dL (ref 8–27)
Bilirubin Total: 0.4 mg/dL (ref 0.0–1.2)
CO2: 25 mmol/L (ref 20–29)
Calcium: 9.3 mg/dL (ref 8.7–10.3)
Chloride: 100 mmol/L (ref 96–106)
Creatinine, Ser: 0.93 mg/dL (ref 0.57–1.00)
Globulin, Total: 2.1 g/dL (ref 1.5–4.5)
Glucose: 85 mg/dL (ref 70–99)
Potassium: 4.4 mmol/L (ref 3.5–5.2)
Sodium: 142 mmol/L (ref 134–144)
Total Protein: 6.4 g/dL (ref 6.0–8.5)
eGFR: 65 mL/min/{1.73_m2} (ref >59)

## 2023-01-16 ENCOUNTER — Telehealth: Payer: Self-pay | Admitting: Family Medicine

## 2023-01-16 ENCOUNTER — Other Ambulatory Visit: Payer: Self-pay | Admitting: Family Medicine

## 2023-01-16 DIAGNOSIS — N6489 Other specified disorders of breast: Secondary | ICD-10-CM

## 2023-01-16 NOTE — Telephone Encounter (Signed)
Pt called stating that she called the breast center to get her mammogram scheduled. When she called them she told them that one of her breasts is sore so they told her that her provider needs to put in an order for her to get ultrasound.   You can call the pt at [phone] with any questions

## 2023-01-16 NOTE — Telephone Encounter (Signed)
pt will need to be seen. KH

## 2023-01-17 ENCOUNTER — Encounter: Payer: Self-pay | Admitting: Nurse Practitioner

## 2023-01-17 ENCOUNTER — Ambulatory Visit (INDEPENDENT_AMBULATORY_CARE_PROVIDER_SITE_OTHER): Payer: Medicare Other | Admitting: Nurse Practitioner

## 2023-01-17 VITALS — BP 146/57 | HR 68 | Temp 96.8°F | Wt 215.6 lb

## 2023-01-17 DIAGNOSIS — Z6837 Body mass index (BMI) 37.0-37.9, adult: Secondary | ICD-10-CM | POA: Diagnosis not present

## 2023-01-17 DIAGNOSIS — N644 Mastodynia: Secondary | ICD-10-CM | POA: Insufficient documentation

## 2023-01-17 DIAGNOSIS — E6609 Other obesity due to excess calories: Secondary | ICD-10-CM | POA: Diagnosis not present

## 2023-01-17 NOTE — Patient Instructions (Signed)
1. Breast pain, left  - MM 3D DIAGNOSTIC MAMMOGRAM BILATERAL BREAST; Future - [place] LIMITED ULTRASOUND INCLUDING AXILLA LEFT BREAST ; Future   2. Class 2 obesity due to excess calories without serious comorbidity with body mass index (BMI) of 37.0 to 37.9 in adult  - Amb Ref to Medical Weight Management   Follow up:  Follow up in 3 months

## 2023-01-17 NOTE — Assessment & Plan Note (Signed)
-   MM 3D DIAGNOSTIC MAMMOGRAM BILATERAL BREAST; Future - [place] LIMITED ULTRASOUND INCLUDING AXILLA LEFT BREAST ; Future   2. Class 2 obesity due to excess calories without serious comorbidity with body mass index (BMI) of 37.0 to 37.9 in adult  - Amb Ref to Medical Weight Management   Follow up:  Follow up in 3 months

## 2023-01-17 NOTE — Progress Notes (Signed)
@Patient  ID: Eileen Morgan, female    DOB: June 07, 1950, 72 y.o.   MRN: [id number]  Chief Complaint  Patient presents with   Breast Pain    Left a little sore    Referring provider: Massie Maroon, FNP   HPI  Eileen Morgan is a very pleasant 72 year old female with a medical history significant for essential hypertension, prediabetes, obesity with BMI greater than 35, history of seizure disorder, and former tobacco dependence   Patient presents today with left breast pain.  She states that this is intermittent pain.  She was scheduled for mammogram and when she told the imaging tech that she was having left breast pain they set her up an appointment to come in to be evaluated.  Patient does not have any palpable lump to her left breast.  We will order ultrasound and diagnostic mammogram. Denies f/c/s, n/v/d, hemoptysis, PND, leg swelling Denies chest pain or edema  Patient is concerned today about her weight.  We will place a referral for weight management.  Patient can follow-up with PCP.    Allergies  Allergen Reactions   Penicillins Anaphylaxis   Yellow Jacket Venom     Immunization History  Administered Date(s) Administered   Fluad Quad(high Dose 65+) 03/27/2022   Influenza,inj,Quad PF,6+ Mos 04/23/2020, 03/28/2021   PFIZER(Purple Top)SARS-COV-2 Vaccination 04/23/2020, 05/11/2020   Pneumococcal Conjugate-13 06/21/2020   Pneumococcal Polysaccharide-23 01/01/2023   Tdap 03/28/2021    Past Medical History:  Diagnosis Date   COPD (chronic obstructive pulmonary disease) (HCC)    Pulmonary embolism (HCC)    Seizures (HCC)     Tobacco History: Social History   Tobacco Use  Smoking Status Former   Current packs/day: 0.00   Average packs/day: 1 pack/day for 40.0 years (40.0 ttl pk-yrs)   Types: Cigarettes   Start date: 03/08/1979   Quit date: 03/08/2019   Years since quitting: 3.8  Smokeless Tobacco Never   Counseling given: Not Answered   Outpatient Encounter  Medications as of 01/17/2023  Medication Sig   buPROPion (WELLBUTRIN SR) 200 MG 12 hr tablet TAKE 1 TABLET BY MOUTH TWICE A DAY   hydrochlorothiazide (HYDRODIURIL) 25 MG tablet TAKE 1 TABLET (25 MG TOTAL) BY MOUTH DAILY.   levETIRAcetam (KEPPRA) 500 MG tablet TAKE 1 TABLET BY MOUTH TWICE A DAY   meloxicam (MOBIC) 15 MG tablet TAKE 1 TABLET (15 MG TOTAL) BY MOUTH DAILY.   omeprazole (PRILOSEC) 20 MG capsule TAKE 1 CAPSULE BY MOUTH EVERY DAY   oxybutynin (DITROPAN-XL) 10 MG 24 hr tablet TAKE 1 TABLET BY MOUTH EVERYDAY AT BEDTIME   OXYGEN Inhale 2 L into the lungs daily as needed (oxygen).   loratadine (CLARITIN) 10 MG tablet Take 1 tablet (10 mg total) by mouth daily. (Patient not taking: Reported on 01/01/2023)   Semaglutide,0.25 or 0.5MG /DOS, 2 MG/3ML SOPN Inject 0.25 mg into the skin once a week. (Patient not taking: Reported on 01/17/2023)   No facility-administered encounter medications on file as of 01/17/2023.     Review of Systems  Review of Systems  Constitutional: Negative.   HENT: Negative.    Cardiovascular: Negative.   Gastrointestinal: Negative.   Allergic/Immunologic: Negative.   Neurological: Negative.   Psychiatric/Behavioral: Negative.         Physical Exam  BP (!) 146/57   Pulse 68   Temp (!) 96.8 F (36 C)   Wt 215 lb 9.6 oz (97.8 kg)   SpO2 97%   BMI 37.01 kg/m   Wt Readings from  Last 5 Encounters:  01/17/23 215 lb 9.6 oz (97.8 kg)  01/01/23 215 lb 3.2 oz (97.6 kg)  07/03/22 213 lb (96.6 kg)  05/17/22 211 lb (95.7 kg)  03/27/22 210 lb 3.2 oz (95.3 kg)     Physical Exam Vitals and nursing note reviewed.  Constitutional:      General: She is not in acute distress.    Appearance: She is well-developed.  Cardiovascular:     Rate and Rhythm: Normal rate and regular rhythm.  Pulmonary:     Effort: Pulmonary effort is normal.     Breath sounds: Normal breath sounds.  Chest:  Breasts:    Left: Tenderness present. No mass or skin change.   Neurological:     Mental Status: She is alert and oriented to person, place, and time.      Lab Results:  CBC    Component Value Date/Time   WBC 6.2 06/16/2019 1150   WBC 5.9 03/21/2019 0356   RBC 4.56 06/16/2019 1150   RBC 4.49 03/21/2019 0356   HGB 13.5 06/16/2019 1150   HCT 41.9 06/16/2019 1150   PLT 240 06/16/2019 1150   MCV 92 06/16/2019 1150   MCH 29.6 06/16/2019 1150   MCH 29.6 03/21/2019 0356   MCHC 32.2 06/16/2019 1150   MCHC 32.0 03/21/2019 0356   RDW 12.3 06/16/2019 1150    BMET    Component Value Date/Time   NA 142 01/01/2023 1011   K 4.4 01/01/2023 1011   CL 100 01/01/2023 1011   CO2 25 01/01/2023 1011   GLUCOSE 85 01/01/2023 1011   GLUCOSE 169 (H) 03/21/2019 0356   BUN 16 01/01/2023 1011   CREATININE 0.93 01/01/2023 1011   CALCIUM 9.3 01/01/2023 1011   GFRNONAA 87 06/16/2019 1150   GFRAA 100 06/16/2019 1150     Assessment & Plan:   Breast pain, left - MM 3D DIAGNOSTIC MAMMOGRAM BILATERAL BREAST; Future - [place] LIMITED ULTRASOUND INCLUDING AXILLA LEFT BREAST ; Future   2. Class 2 obesity due to excess calories without serious comorbidity with body mass index (BMI) of 37.0 to 37.9 in adult  - Amb Ref to Medical Weight Management   Follow up:  Follow up in 3 months     [name], NP 01/17/2023

## 2023-01-20 ENCOUNTER — Other Ambulatory Visit: Payer: Self-pay | Admitting: Nurse Practitioner

## 2023-01-20 DIAGNOSIS — I10 Essential (primary) hypertension: Secondary | ICD-10-CM

## 2023-02-11 ENCOUNTER — Ambulatory Visit: Payer: Medicare Other | Admitting: Dermatology

## 2023-02-12 ENCOUNTER — Other Ambulatory Visit: Payer: Self-pay | Admitting: Family Medicine

## 2023-02-12 DIAGNOSIS — N3289 Other specified disorders of bladder: Secondary | ICD-10-CM

## 2023-02-12 DIAGNOSIS — N3281 Overactive bladder: Secondary | ICD-10-CM

## 2023-02-20 ENCOUNTER — Other Ambulatory Visit: Payer: Self-pay | Admitting: Family Medicine

## 2023-02-20 ENCOUNTER — Ambulatory Visit
Admission: RE | Admit: 2023-02-20 | Discharge: 2023-02-20 | Disposition: A | Discharge status: Home / Self Care | Payer: Medicare Other | Source: Ambulatory Visit | Attending: Nurse Practitioner | Admitting: Nurse Practitioner

## 2023-02-20 ENCOUNTER — Ambulatory Visit: Admission: RE | Admit: 2023-02-20 | Payer: Medicare Other | Source: Ambulatory Visit

## 2023-02-20 DIAGNOSIS — R928 Other abnormal and inconclusive findings on diagnostic imaging of breast: Secondary | ICD-10-CM | POA: Diagnosis not present

## 2023-02-20 DIAGNOSIS — N644 Mastodynia: Secondary | ICD-10-CM

## 2023-02-20 DIAGNOSIS — K219 Gastro-esophageal reflux disease without esophagitis: Secondary | ICD-10-CM

## 2023-02-22 ENCOUNTER — Other Ambulatory Visit: Payer: Self-pay | Admitting: Family Medicine

## 2023-02-22 DIAGNOSIS — G8929 Other chronic pain: Secondary | ICD-10-CM

## 2023-02-25 NOTE — Telephone Encounter (Signed)
Please advise KH 

## 2023-03-04 ENCOUNTER — Ambulatory Visit: Payer: Medicare Other | Admitting: Dermatology

## 2023-03-07 ENCOUNTER — Ambulatory Visit (INDEPENDENT_AMBULATORY_CARE_PROVIDER_SITE_OTHER): Payer: Medicare Other | Admitting: Dermatology

## 2023-03-07 ENCOUNTER — Encounter: Payer: Self-pay | Admitting: Dermatology

## 2023-03-07 DIAGNOSIS — L821 Other seborrheic keratosis: Secondary | ICD-10-CM | POA: Diagnosis not present

## 2023-03-07 NOTE — Progress Notes (Signed)
   New Patient Visit   Subjective  Eileen Morgan is a 72 y.o. female who presents for the following: Manson Passey, raised spots on the left forearm and knees, patient concerned about. She also has a few growths on the face she would like checked. She picks at growths on the right upper arm.   The patient has spots, moles and lesions to be evaluated, some may be new or changing.   The following portions of the chart were reviewed this encounter and updated as appropriate: medications, allergies, medical history  Review of Systems:  No other skin or systemic complaints except as noted in HPI or Assessment and Plan.  Objective  Well appearing patient in no apparent distress; mood and affect are within normal limits.  A focused examination was performed of the following areas: Face, left arm  Relevant physical exam findings are noted in the Assessment and Plan.    Assessment & Plan   SEBORRHEIC KERATOSIS - Stuck-on, waxy, tan-brown papules and/or plaques, including face, left forearm  - Benign-appearing - Discussed benign etiology and prognosis. - Observe - Call for any changes - If areas become bothersome, patient will return for cryotherapy treatment.  - Discussed cosmetic options if areas are not symptomatic.  Discussed cosmetic procedure, noncovered.     Return if symptoms worsen or fail to improve.  Wendee Beavers, CMA, am acting as scribe for Elie Goody, MD .   Documentation: I have reviewed the above documentation for accuracy and completeness, and I agree with the above.  Elie Goody, MD

## 2023-03-07 NOTE — Patient Instructions (Addendum)

## 2023-03-20 ENCOUNTER — Encounter (INDEPENDENT_AMBULATORY_CARE_PROVIDER_SITE_OTHER): Payer: Medicare Other | Admitting: Adult Health

## 2023-04-20 ENCOUNTER — Other Ambulatory Visit: Payer: Self-pay | Admitting: Family Medicine

## 2023-04-20 DIAGNOSIS — Z8669 Personal history of other diseases of the nervous system and sense organs: Secondary | ICD-10-CM

## 2023-04-22 ENCOUNTER — Ambulatory Visit (INDEPENDENT_AMBULATORY_CARE_PROVIDER_SITE_OTHER): Payer: Medicare Other | Admitting: Adult Health

## 2023-04-22 ENCOUNTER — Encounter (INDEPENDENT_AMBULATORY_CARE_PROVIDER_SITE_OTHER): Payer: Self-pay | Admitting: Adult Health

## 2023-04-22 VITALS — BP 138/82 | HR 63 | Temp 97.9°F | Ht 62.5 in | Wt 211.0 lb

## 2023-04-22 DIAGNOSIS — Z6837 Body mass index (BMI) 37.0-37.9, adult: Secondary | ICD-10-CM | POA: Diagnosis not present

## 2023-04-22 DIAGNOSIS — Z0289 Encounter for other administrative examinations: Secondary | ICD-10-CM

## 2023-04-22 DIAGNOSIS — E669 Obesity, unspecified: Secondary | ICD-10-CM

## 2023-04-22 DIAGNOSIS — R7303 Prediabetes: Secondary | ICD-10-CM | POA: Diagnosis not present

## 2023-04-22 DIAGNOSIS — I10 Essential (primary) hypertension: Secondary | ICD-10-CM | POA: Diagnosis not present

## 2023-04-22 NOTE — Progress Notes (Addendum)
Office: [phone]  /  Fax: [phone]   Initial Visit  Eileen Morgan was seen in clinic today to evaluate for obesity. She is interested in losing weight to improve overall health and reduce the risk of weight related complications. She presents today to review program treatment options, initial physical assessment, and evaluation.     She was referred by: PCP  When asked what else they would like to accomplish? She states: Adopt healthier eating patterns, Improve energy levels and physical activity, and Improve quality of life  Weight history: 2019- weight 140-150.  She quit tobacco use and her weight has been steadily increasing.  Current weight 211 lbs  When asked how has your weight affected you? She states: Contributed to medical problems, Contributed to orthopedic problems or mobility issues, Having fatigue, Having poor endurance, and Problems with eating patterns  Some associated conditions: Hypertension, Hyperlipidemia, and Prediabetes  Contributing factors: Moderate to high levels of stress, Reduced physical activity, Eating patterns, and Tobacco Cessation  Weight promoting medications identified: Psychotropic medications and Antiepileptics  Current nutrition plan: None  Current level of physical activity: None  Current or previous pharmacotherapy: None  Response to medication: Never tried medications   Past medical history includes:   Past Medical History:  Diagnosis Date   COPD (chronic obstructive pulmonary disease) (HCC)    Pulmonary embolism (HCC)    Seizures (HCC)      Objective:   BP 138/82   Pulse 63   Temp 97.9 F (36.6 C)   Ht 5' 2.5" (1.588 m)   Wt 211 lb (95.7 kg)   SpO2 96%   BMI 37.98 kg/m  She was weighed on the bioimpedance scale: Body mass index is 37.98 kg/m.  Peak Weight:211 , Body Fat%:45.3, Visceral Fat Rating:16, Weight trend over the last 12 months: Unchanged  General:  Alert, oriented and cooperative. Patient is in no acute  distress.  Respiratory: Normal respiratory effort, no problems with respiration noted   Gait: able to ambulate independently  Mental Status: Normal mood and affect. Normal behavior. Normal judgment and thought content.   DIAGNOSTIC DATA REVIEWED:  BMET    Component Value Date/Time   NA 142 01/01/2023 1011   K 4.4 01/01/2023 1011   CL 100 01/01/2023 1011   CO2 25 01/01/2023 1011   GLUCOSE 85 01/01/2023 1011   GLUCOSE 169 (H) 03/21/2019 0356   BUN 16 01/01/2023 1011   CREATININE 0.93 01/01/2023 1011   CALCIUM 9.3 01/01/2023 1011   GFRNONAA 87 06/16/2019 1150   GFRAA 100 06/16/2019 1150   Lab Results  Component Value Date   HGBA1C 5.7 (A) 01/01/2023   HGBA1C 5.5 12/22/2020   No results found for: "INSULIN" CBC    Component Value Date/Time   WBC 6.2 06/16/2019 1150   WBC 5.9 03/21/2019 0356   RBC 4.56 06/16/2019 1150   RBC 4.49 03/21/2019 0356   HGB 13.5 06/16/2019 1150   HCT 41.9 06/16/2019 1150   PLT 240 06/16/2019 1150   MCV 92 06/16/2019 1150   MCH 29.6 06/16/2019 1150   MCH 29.6 03/21/2019 0356   MCHC 32.2 06/16/2019 1150   MCHC 32.0 03/21/2019 0356   RDW 12.3 06/16/2019 1150   Iron/TIBC/Ferritin/ %Sat No results found for: "IRON", "TIBC", "FERRITIN", "IRONPCTSAT" Lipid Panel     Component Value Date/Time   CHOL 173 07/03/2022 1041   TRIG 173 (H) 07/03/2022 1041   HDL 55 07/03/2022 1041   CHOLHDL 3.1 07/03/2022 1041   LDLCALC 89 07/03/2022 1041  Hepatic Function Panel     Component Value Date/Time   PROT 6.4 01/01/2023 1011   ALBUMIN 4.3 01/01/2023 1011   AST 21 01/01/2023 1011   ALT 25 01/01/2023 1011   ALKPHOS 79 01/01/2023 1011   BILITOT 0.4 01/01/2023 1011   BILIDIR 0.12 07/03/2022 1041      Component Value Date/Time   TSH 2.850 03/27/2022 1115     Assessment and Plan:   Hypertension, unspecified type  Prediabetes  Obesity (BMI 30-39.9), Starting BMI 37.95  ESTABLISH WITH HWW   Obesity Treatment / Action Plan:  Patient will  work on garnering support from family and friends to begin weight loss journey. Will work on eliminating or reducing the presence of highly palatable, calorie dense foods in the home. Will complete provided nutritional and psychosocial assessment questionnaire before the next appointment. Will be scheduled for indirect calorimetry to determine resting energy expenditure in a fasting state.  This will allow [place] to create a reduced calorie, high-protein meal plan to promote loss of fat mass while preserving muscle mass. Counseled on the health benefits of losing 5%-15% of total body weight. Was counseled on nutritional approaches to weight loss and benefits of reducing processed foods and consuming plant-based foods and high quality protein as part of nutritional weight management. Was counseled on pharmacotherapy and role as an adjunct in weight management.   Obesity Education Performed Today:  She was weighed on the bioimpedance scale and results were discussed and documented in the synopsis.  We discussed obesity as a disease and the importance of a more detailed evaluation of all the factors contributing to the disease.  We discussed the importance of long term lifestyle changes which include nutrition, exercise and behavioral modifications as well as the importance of customizing this to her specific health and social needs.  We discussed the benefits of reaching a healthier weight to alleviate the symptoms of existing conditions and reduce the risks of the biomechanical, metabolic and psychological effects of obesity.  Eileen Morgan appears to be in the action stage of change and states they are ready to start intensive lifestyle modifications and behavioral modifications.  30 minutes was spent today on this visit including the above counseling, pre-visit chart review, and post-visit documentation.  Reviewed by clinician on day of visit: allergies, medications, problem list, medical history,  surgical history, family history, social history, and previous encounter notes pertinent to obesity diagnosis.   Eileen Wanamaker d. Dewitt Judice, NP-C

## 2023-05-16 ENCOUNTER — Other Ambulatory Visit: Payer: Self-pay | Admitting: *Deleted

## 2023-05-16 DIAGNOSIS — Z122 Encounter for screening for malignant neoplasm of respiratory organs: Secondary | ICD-10-CM

## 2023-05-16 DIAGNOSIS — Z87891 Personal history of nicotine dependence: Secondary | ICD-10-CM

## 2023-05-20 ENCOUNTER — Other Ambulatory Visit: Payer: Self-pay | Admitting: Family Medicine

## 2023-05-20 DIAGNOSIS — F32A Depression, unspecified: Secondary | ICD-10-CM

## 2023-05-20 DIAGNOSIS — N3281 Overactive bladder: Secondary | ICD-10-CM

## 2023-05-20 DIAGNOSIS — N3289 Other specified disorders of bladder: Secondary | ICD-10-CM

## 2023-05-23 ENCOUNTER — Encounter (INDEPENDENT_AMBULATORY_CARE_PROVIDER_SITE_OTHER): Payer: Self-pay

## 2023-05-23 ENCOUNTER — Ambulatory Visit (INDEPENDENT_AMBULATORY_CARE_PROVIDER_SITE_OTHER): Payer: Medicare Other | Admitting: Family Medicine

## 2023-05-24 ENCOUNTER — Ambulatory Visit (INDEPENDENT_AMBULATORY_CARE_PROVIDER_SITE_OTHER): Payer: Medicare Other | Admitting: Acute Care

## 2023-05-24 ENCOUNTER — Other Ambulatory Visit: Payer: Self-pay | Admitting: Family Medicine

## 2023-05-24 DIAGNOSIS — I10 Essential (primary) hypertension: Secondary | ICD-10-CM

## 2023-05-24 DIAGNOSIS — Z87891 Personal history of nicotine dependence: Secondary | ICD-10-CM

## 2023-05-24 NOTE — Progress Notes (Addendum)
 Provider Attestation I agree with the documentation of the Shared Decision Making visit,  smoking cessation counseling if appropriate, and verification or eligibility for lung cancer screening as documented by the RN Nurse Navigator.   Raejean Bullock, MSN, AGACNP-BC [facility] See Amion for personal pager PCCM on call pager [phone]    Virtual Visit via Telephone Note  I connected with Christean Courts on 05/24/23 at 12:30 PM EST by telephone and verified that I am speaking with the correct person using two identifiers.  Location: Patient: Eileen Morgan  Provider: Alyse Bach, RN    I discussed the limitations, risks, security and privacy concerns of performing an evaluation and management service by telephone and the availability of in person appointments. I also discussed with the patient that there may be a patient responsible charge related to this service. The patient expressed understanding and agreed to proceed.    Shared Decision Making Visit Lung Cancer Screening Program [phone])   Eligibility: Age 73 y.o. Pack Years Smoking History Calculation 40 (# packs/per year x # years smoked) Recent History of coughing up blood  no Unexplained weight loss? no ( >Than 15 pounds within the last 6 months ) Prior History Lung / other cancer no (Diagnosis within the last 5 years already requiring surveillance chest CT Scans). Smoking Status Former Smoker Former Smokers: Years since quit: 5 years  Quit Date: 2020  Visit Components: Discussion included one or more decision making aids. yes Discussion included risk/benefits of screening. yes Discussion included potential follow up diagnostic testing for abnormal scans. yes Discussion included meaning and risk of over diagnosis. yes Discussion included meaning and risk of False Positives. yes Discussion included meaning of total radiation exposure. yes  Counseling Included: Importance of  adherence to annual lung cancer LDCT screening. yes Impact of comorbidities on ability to participate in the program. yes Ability and willingness to under diagnostic treatment. yes  Smoking Cessation Counseling: Current Smokers:  Discussed importance of smoking cessation. yes Information about tobacco cessation classes and interventions provided to patient. yes Patient provided with "ticket" for LDCT Scan. no Symptomatic Patient. no  Counseling(Intermediate counseling: > three minutes) 99406 Diagnosis Code: Tobacco Use Z72.0 Asymptomatic Patient yes  Counseling (Intermediate counseling: > three minutes counseling) [id number] Former Smokers:  Discussed the importance of maintaining cigarette abstinence. yes Diagnosis Code: Personal History of Nicotine Dependence. G95.621 Information about tobacco cessation classes and interventions provided to patient. Yes Patient provided with "ticket" for LDCT Scan. no Written Order for Lung Cancer Screening with LDCT placed in Epic. Yes (CT Chest Lung Cancer Screening Low Dose W/O CM) [id number] Z12.2-Screening of respiratory organs Z87.891-Personal history of nicotine dependence   Alyse Bach, RN

## 2023-05-24 NOTE — Patient Instructions (Signed)

## 2023-05-30 ENCOUNTER — Ambulatory Visit
Admission: RE | Admit: 2023-05-30 | Discharge: 2023-05-30 | Disposition: A | Discharge status: Home / Self Care | Payer: Medicare Other | Source: Ambulatory Visit | Attending: Acute Care | Admitting: Acute Care

## 2023-05-30 DIAGNOSIS — Z122 Encounter for screening for malignant neoplasm of respiratory organs: Secondary | ICD-10-CM

## 2023-05-30 DIAGNOSIS — Z87891 Personal history of nicotine dependence: Secondary | ICD-10-CM

## 2023-06-06 ENCOUNTER — Ambulatory Visit (INDEPENDENT_AMBULATORY_CARE_PROVIDER_SITE_OTHER): Payer: Medicare Other | Admitting: Family Medicine

## 2023-06-06 ENCOUNTER — Encounter (INDEPENDENT_AMBULATORY_CARE_PROVIDER_SITE_OTHER): Payer: Self-pay | Admitting: Family Medicine

## 2023-06-06 VITALS — BP 147/53 | HR 68 | Temp 97.8°F | Ht 62.0 in | Wt 208.0 lb

## 2023-06-06 DIAGNOSIS — Z9189 Other specified personal risk factors, not elsewhere classified: Secondary | ICD-10-CM

## 2023-06-06 DIAGNOSIS — E669 Obesity, unspecified: Secondary | ICD-10-CM

## 2023-06-06 DIAGNOSIS — K219 Gastro-esophageal reflux disease without esophagitis: Secondary | ICD-10-CM

## 2023-06-06 DIAGNOSIS — F32A Depression, unspecified: Secondary | ICD-10-CM | POA: Diagnosis not present

## 2023-06-06 DIAGNOSIS — Z1331 Encounter for screening for depression: Secondary | ICD-10-CM | POA: Diagnosis not present

## 2023-06-06 DIAGNOSIS — Z8632 Personal history of gestational diabetes: Secondary | ICD-10-CM

## 2023-06-06 DIAGNOSIS — R0602 Shortness of breath: Secondary | ICD-10-CM

## 2023-06-06 DIAGNOSIS — R5383 Other fatigue: Secondary | ICD-10-CM | POA: Diagnosis not present

## 2023-06-06 DIAGNOSIS — Z6841 Body Mass Index (BMI) 40.0 and over, adult: Secondary | ICD-10-CM | POA: Diagnosis not present

## 2023-06-06 DIAGNOSIS — I10 Essential (primary) hypertension: Secondary | ICD-10-CM

## 2023-06-06 DIAGNOSIS — Z8739 Personal history of other diseases of the musculoskeletal system and connective tissue: Secondary | ICD-10-CM

## 2023-06-06 DIAGNOSIS — Z8669 Personal history of other diseases of the nervous system and sense organs: Secondary | ICD-10-CM

## 2023-06-06 NOTE — Progress Notes (Signed)
Carlye Grippe, D.O.  ABFM, ABOM Specializing in Clinical Bariatric Medicine Office located at: [address] [place], [place]  [address]    Bariatric Medicine Visit  Dear Eileen Andrew, NP  Thank you for referring Eileen Morgan to our clinic today for evaluation.  We performed a consultation to discuss her options for treatment and educate the patient on her disease state.  The following note includes my evaluation and treatment recommendations.   Please do not hesitate to reach out to me directly if you have any further concerns.   Assessment and Plan:   FOR THE DISEASE OF OBESITY:  Obesity (BMI 30-39.9), Starting BMI 38.03  Assessment & Plan: Roben is currently in the action stage of change. As such, her goal is to start our weight management plan.  She has agreed to: follow the Category 2 plan   Behavioral Intervention We discussed the following today: properly using a food scale, calorie content of various fruits, measuring veggies and lean protein intake, decreasing simple carbohydrates , work on meal planning and preparation, and begin to work on implementation of reduced calorie nutritional plan  Additional resources provided today: handout on CAT 2 meal plan  Evidence-based interventions for health behavior change were utilized today including the discussion of self monitoring techniques, problem-solving barriers and SMART goal setting techniques.    Pt will specifically work on: n/a   Recommended Physical Activity Goals Chane has been advised to work up to 150 minutes of moderate intensity aerobic activity a week and strengthening exercises 2-3 times per week for cardiovascular health, weight loss maintenance and preservation of muscle mass.   She has agreed to : maintain current level of activity.    Pharmacotherapy We  both agreed to : begin with nutritional and behavioral strategies   FOR ASSOCIATED CONDITIONS ADDRESSED  TODAY:  Fatigue Assessment & Plan: Eileen Morgan does feel that her weight is causing her energy to be lower than it should be. Fatigue may be related to obesity, depression or many other causes. she does not appear to have any red flag symptoms and this appears to most likely be related to her current lifestyle habits and dietary intake.  Labs will be ordered and reviewed with her at their next office visit in two weeks.  Epworth sleepiness scale is 9. Angeliz admits to some daytime somnolence and admits to waking up still tired. Eileen Morgan generally gets 8 hours of sleep per night, and states that she has generally restful sleep. Snoring is present per her grandson. Apneic episodes are not present.   ECG: Performed and reviewed/ interpreted independently.  Normal sinus rhythm, rate 63 bpm; reassuring without any acute abnormalities, will continue to monitor for symptoms   Orders: -     EKG 12-Lead -     CBC with Differential/Platelet -     Comprehensive metabolic panel -     Lipid panel -     T4, free -     TSH -     Vitamin B12 -     Hemoglobin A1c -     Insulin, random   Shortness of breath on exertion Assessment & Plan: Eileen Morgan does feel that she gets out of breath more easily than she used to when she exercises and seems to be worsening over time with weight gain.  This has gotten worse recently. Eileen Morgan denies shortness of breath at rest or orthopnea. Eileen Morgan's shortness of breath appears to be obesity related and exercise induced, as they  do not appear to have any "red flag" symptoms/ concerns today.  Also, this condition appears to be related to a state of poor cardiovascular conditioning   Obtain labs today and will be reviewed with her at their next office visit in two weeks.  Indirect Calorimeter completed today to help guide our dietary regimen. It shows a VO2 of 321 and a REE of 2218.  Her calculated basal metabolic rate is 1308 thus her resting energy expenditure is better than  expected.  Patient agreed to work on weight loss at this time.  As Eileen Morgan progresses through our weight loss program, we will gradually increase exercise as tolerated to treat her current condition.   If Eileen Morgan follows our recommendations and loses 5-10% of their weight without improvement of her shortness of breath or if at any time, symptoms become more concerning, they agree to urgently follow up with their PCP/ specialist for further consideration/ evaluation.   Eileen Morgan verbalizes agreement with this plan.    Depression Screen  Assessment & Plan: Her Food and Mood (modified PHQ-9) score was positive at 12. She denies SI/HI. We will monitor this closely and work on CBT to help improve the non-hunger eating patterns. Referral to bariatric psychology may be warranted in the future.   GERD without esophagitis Assessment & Plan: Relevant medication: Omeprazole 20 mg daily. Symptoms are essentially controlled and she has no concerns today about this condition or it's treatment regimen. Continue medication regimen. Begin RCNP and advance exercise in the future as tolerated.    Hypertension, unspecified type Assessment & Plan: Last 3 blood pressure readings in our office are as follows: BP Readings from Last 3 Encounters:  06/06/23 (!) 147/53  04/22/23 138/82  01/17/23 (!) 146/57   HTN treated with Hydrochlorothiazide 25 mg daily. BP above target today - pt is completely asymptomatic. She occasionally checks her BP at home.   Discussed goal BP: 140/90 or less. Encouraged pt to monitor BP at home 3x per week - sit quietly for 15-20 minutes prior to checking. Begin weight loss therapy via low sodium diet - losing 10% or more of body weight may improve condition. Maintain with antihypertensive.    H/O gestational diabetes mellitus, not currently pregnant Assessment & Plan: Pt reports having gestational diabetes while pregnant with all 4 of her children. Plan: see at risk for diabetes mellitus  note.   At risk for diabetes mellitus Assessment & Plan: Counseling was done with pt how independent of her weight, having a hx of gestational diabetes with all 4 children puts her at a significantly higher risk for T2DM. I emphasized the importance of minimizing risks through healthy eating, exercise, and greater cardiovascular fitness.    H/O osteoporosis Assessment & Plan: Pt endorses having a hx of osteoporosis. She has never been on Vitamin D supplementation. Will recheck vitamin D today.   Orders: -     VITAMIN D 25 Hydroxy (Vit-D Deficiency, Fractures)   Depression, unspecified depression type - emotional eating Assessment & Plan: Relevant medication - Wellbutrin SR 200 mg twice daily. Pt states she was started on Wellbutrin to help with quitting smoking and for depression. Additionally, pt eats when stressed, bored, and to help comfort herself.   Educated pt that Wellbutrin lowers seizure threshold; I recommend she discuss with her treating provider whether or not she should continue Wellbutrin while being on Keppra. Referral to bariatric psychology may be warranted in the future for emotional eating. Begin prudent nutritional plan.    History  of seizure disorder Assessment & Plan: Condition managed by PCP. Relevant medication: Keppra 500 mg twice daily. Pt with a 40 year smoking hx - started smoking at 39 y.o, smoked 1 pack a day,  quit at 73 y.o. Pt had 3 seizure episodes when she was actively smoking. Since she quit, she has not experienced any seizures. I recommend she discuss with PCP if Keppra is something she needs to continue to be on.    FOLLOW UP:   Follow up in 2 weeks. She was informed of the importance of frequent follow up visits to maximize her success with intensive lifestyle modifications for her multiple health conditions.  Caylea Foronda is aware that we will review all of her lab results at our next visit.  She is aware that if anything is critical/ life  threatening with the results, we will be contacting her via MyChart prior to the office visit to discuss management.    Chief Complaint:   OBESITY Melina Mosteller (MR# [id number]) is a pleasant 74 y.o. female who presents for evaluation and treatment of obesity and related comorbidities. Current BMI is Body mass index is 38.04 kg/m. Ambri Miltner has been struggling with her weight for many years and has been unsuccessful in either losing weight, maintaining weight loss, or reaching her healthy weight goal.  Geri Hepler is currently in the action stage of change and ready to dedicate time achieving and maintaining a healthier weight. Britiny Defrain is interested in becoming our patient and working on intensive lifestyle modifications including (but not limited to) diet and exercise for weight loss.  Hamsini Verrilli is retired. She is accompanied by her daughter Burnett Harry today.  Patient is divorced and has 4 adult children. She lives alone.  She desires to be 140 lbs as soon as possible.   Started to gain weight after quitting smoking at the age of 37. She has a 40 year smoking hx - started smoking at 73 y.o, smoked 1 pack a day, and quit at 73 y.o.  She does not engage in any formal exercise.   Has never tried a formal diet plan.   Eats outside the home 1-2x per week.  She enjoys cooking.   Craves potato chips and chocolate.   Frequently snacks between meals. Occasionally snacks on candy bars or fruit after dinner.  Drinks regular soda 1 daily, coffee with creamer and sugar, regular milk and juice.  Worst food habit: not stated  Subjective:   This is the patient's first visit at [facility].  The patient's NEW PATIENT PACKET that they filled out prior to today's office visit was reviewed at length and information from that paperwork was included within the following office visit note.    Included in the packet: current and past health history, medications, allergies, ROS,  gynecologic history (women only), surgical history, family history, social history, weight history, weight loss surgery history (for those that have had weight loss surgery), nutritional evaluation, mood and food questionnaire along with a depression screening (PHQ9) on all patients, an Epworth questionnaire, sleep habits questionnaire, patient life and health improvement goals questionnaire. These will all be scanned into the patient's chart under the "media" tab.   Review of Systems: Please refer to new patient packet scanned into media. Pertinent positives were addressed with patient today.  Reviewed by clinician on day of visit: allergies, medications, problem list, medical history, surgical history, family history, social history, and previous encounter notes.  During the visit, I independently reviewed  the patient's EKG, bioimpedance scale results, and indirect calorimeter results. I used this information to tailor a meal plan for the patient that will help Clio Gerhart to lose weight and will improve her obesity-related conditions going forward.  I performed a medically necessary appropriate examination and/or evaluation. I discussed the assessment and treatment plan with the patient. The patient was provided an opportunity to ask questions and all were answered. The patient agreed with the plan and demonstrated an understanding of the instructions. Labs were ordered today (unless patient declined them) and will be reviewed with the patient at our next visit unless more critical results need to be addressed immediately. Clinical information was updated and documented in the EMR.   Objective:   PHYSICAL EXAM: Blood pressure (!) 147/53, pulse 68, temperature 97.8 F (36.6 C), height 5\' 2"  (1.575 m), weight 208 lb (94.3 kg), SpO2 90%. Body mass index is 38.04 kg/m.  General: Well Developed, well nourished, and in no acute distress.  HEENT: Normocephalic, atraumatic; EOMI, sclerae are  anicteric. Skin: Warm and dry, good turgor Chest:  Normal excursion, shape, no gross ABN Respiratory: No conversational dyspnea; speaking in full sentences NeuroM-Sk:  Normal gross ROM * 4 extremities  Psych: A and O *3, insight adequate, mood- full   Anthropometric Measurements Height: 5\' 2"  (1.575 m) Weight: 208 lb (94.3 kg) BMI (Calculated): 38.03 Weight at Last Visit: N/A Weight Lost Since Last Visit: N/A Weight Gained Since Last Visit: N/A Starting Weight: 208 lb Peak Weight: 211 lb Waist Measurement : 48 inches   Body Composition  Body Fat %: 46.3 % Fat Mass (lbs): 96.4 lbs Muscle Mass (lbs): 106 lbs Total Body Water (lbs): 73 lbs Visceral Fat Rating : 16   Other Clinical Data Fasting: Yes Labs: Yes Today's Visit #: #1 Starting Date: 06/06/23 Comments: First Visit    DIAGNOSTIC DATA REVIEWED:  BMET    Component Value Date/Time   NA 142 01/01/2023 1011   K 4.4 01/01/2023 1011   CL 100 01/01/2023 1011   CO2 25 01/01/2023 1011   GLUCOSE 85 01/01/2023 1011   GLUCOSE 169 (H) 03/21/2019 0356   BUN 16 01/01/2023 1011   CREATININE 0.93 01/01/2023 1011   CALCIUM 9.3 01/01/2023 1011   GFRNONAA 87 06/16/2019 1150   GFRAA 100 06/16/2019 1150   Lab Results  Component Value Date   HGBA1C 5.7 (A) 01/01/2023   HGBA1C 5.5 12/22/2020   No results found for: "INSULIN" Lab Results  Component Value Date   TSH 2.850 03/27/2022   CBC    Component Value Date/Time   WBC 6.2 06/16/2019 1150   WBC 5.9 03/21/2019 0356   RBC 4.56 06/16/2019 1150   RBC 4.49 03/21/2019 0356   HGB 13.5 06/16/2019 1150   HCT 41.9 06/16/2019 1150   PLT 240 06/16/2019 1150   MCV 92 06/16/2019 1150   MCH 29.6 06/16/2019 1150   MCH 29.6 03/21/2019 0356   MCHC 32.2 06/16/2019 1150   MCHC 32.0 03/21/2019 0356   RDW 12.3 06/16/2019 1150   Iron Studies No results found for: "IRON", "TIBC", "FERRITIN", "IRONPCTSAT" Lipid Panel     Component Value Date/Time   CHOL 173 07/03/2022 1041    TRIG 173 (H) 07/03/2022 1041   HDL 55 07/03/2022 1041   CHOLHDL 3.1 07/03/2022 1041   LDLCALC 89 07/03/2022 1041   Hepatic Function Panel     Component Value Date/Time   PROT 6.4 01/01/2023 1011   ALBUMIN 4.3 01/01/2023 1011   AST 21  01/01/2023 1011   ALT 25 01/01/2023 1011   ALKPHOS 79 01/01/2023 1011   BILITOT 0.4 01/01/2023 1011   BILIDIR 0.12 07/03/2022 1041      Component Value Date/Time   TSH 2.850 03/27/2022 1115   Nutritional No results found for: "VD25OH"  Attestation Statements:   I, Special Puri, acting as a Stage manager for [employer], DO., have compiled all relevant documentation for today's office visit on behalf of Thomasene Lot, DO, while in the presence of [employer], DO.  Reviewed by clinician on day of visit: allergies, medications, problem list, medical history, surgical history, family history, social history, and previous encounter notes pertinent to patient's obesity diagnosis. I have spent 66 minutes in the care of the patient today including: preparing to see patient (e.g. review and interpretation of tests, old notes ), obtaining and/or reviewing separately obtained history, performing a medically appropriate examination or evaluation, counseling and educating the patient, ordering medications, test or procedures, documenting clinical information in the electronic or other health care record, and independently interpreting results and communicating results to the patient, family, or caregiver   I have reviewed the above documentation for accuracy and completeness, and I agree with the above. Carlye Grippe, D.O.  The 21st Century Cures Act was signed into law in 2016 which includes the topic of electronic health records.  This provides immediate access to information in MyChart.  This includes consultation notes, operative notes, office notes, lab results and pathology reports.  If you have any questions about what you read please let us  know at your next visit so we can discuss your concerns and take corrective action if need be.  We are right here with you.

## 2023-06-09 ENCOUNTER — Encounter (INDEPENDENT_AMBULATORY_CARE_PROVIDER_SITE_OTHER): Payer: Self-pay | Admitting: Family Medicine

## 2023-06-09 NOTE — Addendum Note (Signed)
Addended by: Carlye Grippe on: 06/09/2023 05:56 PM   Modules accepted: Level of Service

## 2023-06-10 ENCOUNTER — Other Ambulatory Visit: Payer: Self-pay

## 2023-06-10 DIAGNOSIS — Z122 Encounter for screening for malignant neoplasm of respiratory organs: Secondary | ICD-10-CM

## 2023-06-10 DIAGNOSIS — Z87891 Personal history of nicotine dependence: Secondary | ICD-10-CM

## 2023-06-11 LAB — HEMOGLOBIN A1C
Est. average glucose Bld gHb Est-mCnc: 120 mg/dL
Hgb A1c MFr Bld: 5.8 % — ABNORMAL HIGH (ref 4.8–5.6)

## 2023-06-11 LAB — CBC WITH DIFFERENTIAL/PLATELET
Basophils Absolute: 0 10*3/uL (ref 0.0–0.2)
Basos: 1 %
EOS (ABSOLUTE): 0.1 10*3/uL (ref 0.0–0.4)
Eos: 2 %
Hematocrit: 41.3 % (ref 34.0–46.6)
Hemoglobin: 14.3 g/dL (ref 11.1–15.9)
Immature Grans (Abs): 0 10*3/uL (ref 0.0–0.1)
Immature Granulocytes: 0 %
Lymphocytes Absolute: 1.8 10*3/uL (ref 0.7–3.1)
Lymphs: 34 %
MCH: 31.5 pg (ref 26.6–33.0)
MCHC: 34.6 g/dL (ref 31.5–35.7)
MCV: 91 fL (ref 79–97)
Monocytes Absolute: 0.3 10*3/uL (ref 0.1–0.9)
Monocytes: 6 %
Neutrophils Absolute: 3.2 10*3/uL (ref 1.4–7.0)
Neutrophils: 57 %
Platelets: 76 10*3/uL — CL (ref 150–450)
RBC: 4.54 x10E6/uL (ref 3.77–5.28)
RDW: 12 % (ref 11.7–15.4)
WBC: 5.5 10*3/uL (ref 3.4–10.8)

## 2023-06-11 LAB — COMPREHENSIVE METABOLIC PANEL
ALT: 27 [IU]/L (ref 0–32)
AST: 27 [IU]/L (ref 0–40)
Albumin: 4.4 g/dL (ref 3.8–4.8)
Alkaline Phosphatase: 74 [IU]/L (ref 44–121)
BUN/Creatinine Ratio: 17 (ref 12–28)
BUN: 14 mg/dL (ref 8–27)
Bilirubin Total: 0.5 mg/dL (ref 0.0–1.2)
CO2: 22 mmol/L (ref 20–29)
Calcium: 9.4 mg/dL (ref 8.7–10.3)
Chloride: 102 mmol/L (ref 96–106)
Creatinine, Ser: 0.81 mg/dL (ref 0.57–1.00)
Globulin, Total: 2.2 g/dL (ref 1.5–4.5)
Glucose: 82 mg/dL (ref 70–99)
Potassium: 4.5 mmol/L (ref 3.5–5.2)
Sodium: 142 mmol/L (ref 134–144)
Total Protein: 6.6 g/dL (ref 6.0–8.5)
eGFR: 76 mL/min/{1.73_m2} (ref >59)

## 2023-06-11 LAB — TSH: TSH: 3.01 u[IU]/mL (ref 0.450–4.500)

## 2023-06-11 LAB — LIPID PANEL
Chol/HDL Ratio: 3 {ratio} (ref 0.0–4.4)
Cholesterol, Total: 197 mg/dL (ref 100–199)
HDL: 65 mg/dL (ref >39)
LDL Chol Calc (NIH): 103 mg/dL — ABNORMAL HIGH (ref 0–99)
Triglycerides: 168 mg/dL — ABNORMAL HIGH (ref 0–149)
VLDL Cholesterol Cal: 29 mg/dL (ref 5–40)

## 2023-06-11 LAB — VITAMIN D 25 HYDROXY (VIT D DEFICIENCY, FRACTURES): Vit D, 25-Hydroxy: 25.3 ng/mL — ABNORMAL LOW (ref 30.0–100.0)

## 2023-06-11 LAB — T4, FREE: Free T4: 1.11 ng/dL (ref 0.82–1.77)

## 2023-06-11 LAB — VITAMIN B12: Vitamin B-12: 364 pg/mL (ref 232–1245)

## 2023-06-11 LAB — INSULIN, RANDOM: INSULIN: 6.7 u[IU]/mL (ref 2.6–24.9)

## 2023-06-19 ENCOUNTER — Encounter (INDEPENDENT_AMBULATORY_CARE_PROVIDER_SITE_OTHER): Payer: Self-pay | Admitting: Family Medicine

## 2023-06-19 ENCOUNTER — Ambulatory Visit (INDEPENDENT_AMBULATORY_CARE_PROVIDER_SITE_OTHER): Payer: Medicare Other | Admitting: Family Medicine

## 2023-06-19 VITALS — BP 133/76 | HR 51 | Temp 97.6°F | Ht 62.0 in | Wt 202.0 lb

## 2023-06-19 DIAGNOSIS — E782 Mixed hyperlipidemia: Secondary | ICD-10-CM

## 2023-06-19 DIAGNOSIS — M8580 Other specified disorders of bone density and structure, unspecified site: Secondary | ICD-10-CM | POA: Diagnosis not present

## 2023-06-19 DIAGNOSIS — F5089 Other specified eating disorder: Secondary | ICD-10-CM | POA: Diagnosis not present

## 2023-06-19 DIAGNOSIS — E538 Deficiency of other specified B group vitamins: Secondary | ICD-10-CM | POA: Diagnosis not present

## 2023-06-19 DIAGNOSIS — Z6836 Body mass index (BMI) 36.0-36.9, adult: Secondary | ICD-10-CM

## 2023-06-19 DIAGNOSIS — Z8632 Personal history of gestational diabetes: Secondary | ICD-10-CM

## 2023-06-19 DIAGNOSIS — I10 Essential (primary) hypertension: Secondary | ICD-10-CM | POA: Diagnosis not present

## 2023-06-19 DIAGNOSIS — D696 Thrombocytopenia, unspecified: Secondary | ICD-10-CM | POA: Diagnosis not present

## 2023-06-19 DIAGNOSIS — E669 Obesity, unspecified: Secondary | ICD-10-CM | POA: Diagnosis not present

## 2023-06-19 DIAGNOSIS — Z6838 Body mass index (BMI) 38.0-38.9, adult: Secondary | ICD-10-CM

## 2023-06-19 DIAGNOSIS — F32A Depression, unspecified: Secondary | ICD-10-CM | POA: Diagnosis not present

## 2023-06-19 DIAGNOSIS — E559 Vitamin D deficiency, unspecified: Secondary | ICD-10-CM

## 2023-06-19 DIAGNOSIS — R7303 Prediabetes: Secondary | ICD-10-CM | POA: Insufficient documentation

## 2023-06-19 MED ORDER — CYANOCOBALAMIN 500 MCG PO TABS
500.0000 ug | ORAL_TABLET | Freq: Every day | ORAL | Status: DC
Start: 2023-06-19 — End: 2023-07-03

## 2023-06-19 MED ORDER — VITAMIN D (ERGOCALCIFEROL) 1.25 MG (50000 UNIT) PO CAPS
50000.0000 [IU] | ORAL_CAPSULE | ORAL | 0 refills | Status: DC
Start: 2023-06-19 — End: 2023-07-08

## 2023-06-19 NOTE — Patient Instructions (Signed)
The 10-year ASCVD risk score (Arnett DK, et al., 2019) is: 20.3%   Values used to calculate the score:     Age: 73 years     Sex: Female     Is Non-Hispanic African American: No     Diabetic: No     Tobacco smoker: No     Systolic Blood Pressure: 133 mmHg     Is BP treated: Yes     HDL Cholesterol: 65 mg/dL     Total Cholesterol: 197 mg/dL

## 2023-06-19 NOTE — Progress Notes (Signed)
 Eileen Morgan, D.O.  ABFM, ABOM Clinical Bariatric Medicine Physician  Office located at: [address] [place], [place]  [address]     Assessment and Plan:   FOR THE DISEASE OF OBESITY: Obesity (BMI 30-39.9), Starting BMI 38.03 BMI 36.0-36.9,adult - Current BMI 36.94 Assessment & Plan: Since last office visit on 06/06/23 patient's muscle mass has decreased by 1lb. Fat mass has decreased by 4.6lb. Total body water has decreased by 2.6lb.  Counseling done on how various foods will affect these numbers and how to maximize success  Total lbs lost to date: 6 lbs  Total weight loss percentage to date: -2.88 %   Recommended Dietary Goals Eileen Morgan is currently in the action stage of change. As such, her goal is to continue weight management plan. She has agreed to: Continue with Category 2 meal plan and add breakfast/lunch options   Behavioral Intervention We discussed the following Behavioral Modification Strategies today: increasing lean protein intake to established goals, decreasing simple carbohydrates , increasing vegetables, increasing water intake , avoiding temptations and identifying enticing environmental cues, continue to work on implementation of reduced calorie nutritional plan, continue to practice mindfulness when eating, and continue to work on maintaining a reduced calorie state, getting the recommended amount of protein, incorporating whole foods, making healthy choices, staying well hydrated and practicing mindfulness when eating.  Additional resources provided today: Handout on CAT 1-2 breakfast options, Handout on CAT 1-2 lunch options, and Handout on Insulin Resistance and PreDM  Evidence-based interventions for health behavior change were utilized today including the discussion of self monitoring techniques, problem-solving barriers and SMART goal setting techniques.   Regarding patient's less desirable eating habits and patterns, we employed the technique of  small changes.   Pt will specifically work on: continue with implementation of her meal plan and added B/L options for next visit.   Recommended Physical Activity Goals Eileen Morgan has been advised to work up to 150 minutes of moderate intensity aerobic activity a week and strengthening exercises 2-3 times per week for cardiovascular health, weight loss maintenance and preservation of muscle mass.   She has agreed to :  Think about enjoyable ways to increase daily physical activity and overcoming barriers to exercise and Increase physical activity in their day and reduce sedentary time (increase NEAT).   Pharmacotherapy We discussed various medication options to help Eileen Morgan with her weight loss efforts and we both agreed to: continue with nutritional and behavioral strategies   FOR ASSOCIATED CONDITIONS ADDRESSED TODAY: Thrombocytopenia (HCC)- new onset Assessment & Plan: Lab Results  Component Value Date   PLT 76 (LL) 06/06/2023   Platelets are abnormally low at 76 as of last obtained labs on 06/06/23. Platelets 4 years ago was 240 and prior to that ranged from 230s-250s. Per pt, she has not previously been told she has a hx of thrombocytopenia. She currently has a follow up already scheduled with her PCP on 07/03/23.   I recommend the patient discuss her platelet labs with her PCP at her next follow up and review potential treatment options. Stressed the importance of prioritizing further work up of this with her PCP. Reviewed normal ranges of platelet counts. Will continue to monitor alongside PCP as it relates to her overall health and weight loss journey.    Prediabetes- new onset H/O gestational diabetes mellitus, not currently pregnant Assessment & Plan: Lab Results  Component Value Date   HGBA1C 5.8 (H) 06/06/2023   HGBA1C 5.7 (A) 01/01/2023   HGBA1C  5.8 (A) 03/27/2022   INSULIN 6.7 06/06/2023   Although pt A1C is elevated at 5.8, pt states she has not been told this before nor  was this seen on her EMR. Pt A1C has been in prediabetic range for about 1 year now. No current medications.   I counseled patient on pathophysiology of the disease process of prediabetes. Encouraged pt to increase water intake to at least half her body weight in ounces per day. Pt will continue to work on weight loss, exercise, via their meal plan we devised to help decrease the risk of progressing to diabetes.    Mixed hyperlipidemia- new onset Assessment & Plan: Lab Results  Component Value Date   CHOL 197 06/06/2023   HDL 65 06/06/2023   LDLCALC 103 (H) 06/06/2023   TRIG 168 (H) 06/06/2023   CHOLHDL 3.0 06/06/2023   The 10-year ASCVD risk score (Arnett DK, et al., 2019) is: 20.3%   Values used to calculate the score:     Age: 1 years     Sex: Female     Is Non-Hispanic African American: No     Diabetic: No     Tobacco smoker: No     Systolic Blood Pressure: 133 mmHg     Is BP treated: Yes     HDL Cholesterol: 65 mg/dL     Total Cholesterol: 197 mg/dL  Lipid panel obtained on 06/06/23 reveal elevated LDL of 103 and elevated TGs of 168s. Not currently on any medications.   Ideal triglycerides of 150 or less and LDL of 70 or less reviewed with pt. Follow a heart healthy diet via her meal plan. Pt advised to visit AHA website to read more about cholesterol. Recheck lipid panel 3-4 months from last checked. If not improved when rechecked, I recommend she discuss with her PCP about starting medications. Will continue to monitor condition alongside PCP.    B12 deficiency due to diet- new onset Assessment & Plan: Lab Results  Component Value Date   VITAMINB12 364 06/06/2023   Pt B12 was 364 as of 06/06/23. Not currently on B12 supplementation. Ideal B12 of 500 or more reviewed with pt. Discussed the benefits of optimal B12 levels such as improvements with mood and energy. I recommend pt start OTC B12 500 mcg once daily, pt agreeable to this. Will recheck B12 in 3-4 months.    Orders: - Start B12 OTC 500 mcg daily    Vitamin D deficiency- with osteopenia Assessment & Plan: Lab Results  Component Value Date   VD25OH 25.3 (L) 06/06/2023   New diagnosis of vitamin D. As of last obtained labs on 06/06/23 her vitamin D was 25.3. Pt had a bone density on 12/03/22. T-score was -1.9 which is consistent with osteopenia.   Ideal vitamin levels of 50-70 were reviewed with pt. Reviewed benefits of optimal vitamin D levels. I recommend pt start vitamin D supplementation with ERGO 50K units once weekly. Pt is agreeable to starting ERGO. We will continue to monitor condition and recheck Vit D in 3-4 months.   Orders: - Start ERGO 50,000 units once weekly   Hypertension, unspecified type Assessment & Plan: BP Readings from Last 3 Encounters:  06/19/23 133/76  06/06/23 (!) 147/53  04/22/23 138/82   BP is at goal today. BMP was within normal limits. Pt reports only drinking 2-3 bottles of water per day, approximately 30-50 ounces. She is on HCTZ 25 mg once daily with good compliance and tolerance, managed by PCP.  Continue with current antihypertensive regimen per PCP. Follow up with PCP for chronic management of HTN as directed by them. Pt advised to drink at least 6 bottles of water, at least 100 ounces of water daily. Continue to work on heart healthy diet via her prudent nutritional meal plan.    Depression, unspecified depression type - emotional eating Assessment & Plan: Mood stable today. Denies any SI/HI. Pt is on Wellbutrin SR 200 mg BID and Keppra 500 mg BID. Reports good compliance and tolerance to both meds. No SE reported. Hunger/cravings well controlled.   Continue with current medication regimen as prescribed. Reviewed healthy alternatives to her favorite foods to avoid getting bored of on-plan foods. Will continue to monitor condition as it relates to her weight loss journey.    FOLLOW UP:   Return in about 2 weeks (around 07/03/2023). She was  informed of the importance of frequent follow up visits to maximize her success with intensive lifestyle modifications for her multiple health conditions.   Subjective:   Chief complaint: Obesity Eileen Morgan is here to discuss her progress with her obesity treatment plan. She is on the Category 2 Plan and states she is following her eating plan approximately 100% of the time. She states she is exercising walking 60 minutes 2 days per week.  Interval History:  Eileen Morgan is here today for her first follow-up office visit since starting the program with [place]. Since last office visit she is down 6 lbs. Though she has had multiple temptations from foods family members are eating, she has not indulged in any fast food or off-plan foods. Has followed slow cooker recipes. States she has felt a bit bored with the foods on her meal plan.   All blood work/ lab tests that were recently ordered by myself or an outside provider were reviewed with patient today per their request. Extended time was spent counseling her on all new disease processes that were discovered or preexisting ones that are affected by BMI.  she understands that many of these abnormalities will need to monitored regularly along with the current treatment plan of prudent dietary changes, in which we are making each and every office visit, to improve these health parameters.  Pharmacotherapy for weight loss: She is currently taking Wellbutrin for medical weight loss.  Denies side effects.    Review of Systems:  Pertinent positives were addressed with patient today.  Reviewed by clinician on day of visit: allergies, medications, problem list, medical history, surgical history, family history, social history, and previous encounter notes.   Weight Summary and Biometrics   Weight Lost Since Last Visit: 6lb  Weight Gained Since Last Visit: 0   Vitals Temp: 97.6 F (36.4 C) BP: 133/76 Pulse Rate: (!) 51 SpO2: 96 %   Anthropometric  Measurements Height: 5\' 2"  (1.575 m) Weight: 202 lb (91.6 kg) BMI (Calculated): 36.94 Weight at Last Visit: 208lb Weight Lost Since Last Visit: 6lb Weight Gained Since Last Visit: 0 Starting Weight: 208lb Total Weight Loss (lbs): 6 lb (2.722 kg) Peak Weight: 211lb   Body Composition  Body Fat %: 45.4 % Fat Mass (lbs): 91.8 lbs Muscle Mass (lbs): 105 lbs Total Body Water (lbs): 70.4 lbs Visceral Fat Rating : 15   Other Clinical Data Fasting: no Labs: no Today's Visit #: 2 Starting Date: 06/06/23     Objective:   PHYSICAL EXAM:  Blood pressure 133/76, pulse (!) 51, temperature 97.6 F (36.4 C), height 5\' 2"  (1.575 m), weight 202 lb (91.6  kg), SpO2 96%. Body mass index is 36.95 kg/m.  General: she is overweight, cooperative and in no acute distress.   HEENT: EOMI, sclerae are anicteric. Lungs: Normal breathing effort, no conversational dyspnea. M-Sk:  Normal gross ROM * 4 extremities  PSYCH: Has normal mood, affect and thought process. Neurologic: No gross sensory or motor deficits. Well developed, A and O * 3  DIAGNOSTIC DATA REVIEWED:  BMET    Component Value Date/Time   NA 142 06/06/2023 1440   K 4.5 06/06/2023 1440   CL 102 06/06/2023 1440   CO2 22 06/06/2023 1440   GLUCOSE 82 06/06/2023 1440   GLUCOSE 169 (H) 03/21/2019 0356   BUN 14 06/06/2023 1440   CREATININE 0.81 06/06/2023 1440   CALCIUM 9.4 06/06/2023 1440   GFRNONAA 87 06/16/2019 1150   GFRAA 100 06/16/2019 1150   Lab Results  Component Value Date   HGBA1C 5.8 (H) 06/06/2023   HGBA1C 5.5 12/22/2020   Lab Results  Component Value Date   INSULIN 6.7 06/06/2023   Lab Results  Component Value Date   TSH 3.010 06/06/2023   CBC    Component Value Date/Time   WBC 5.5 06/06/2023 1440   WBC 5.9 03/21/2019 0356   RBC 4.54 06/06/2023 1440   RBC 4.49 03/21/2019 0356   HGB 14.3 06/06/2023 1440   HCT 41.3 06/06/2023 1440   PLT 76 (LL) 06/06/2023 1440   MCV 91 06/06/2023 1440   MCH 31.5  06/06/2023 1440   MCH 29.6 03/21/2019 0356   MCHC 34.6 06/06/2023 1440   MCHC 32.0 03/21/2019 0356   RDW 12.0 06/06/2023 1440   Iron Studies No results found for: "IRON", "TIBC", "FERRITIN", "IRONPCTSAT" Lipid Panel     Component Value Date/Time   CHOL 197 06/06/2023 1440   TRIG 168 (H) 06/06/2023 1440   HDL 65 06/06/2023 1440   CHOLHDL 3.0 06/06/2023 1440   LDLCALC 103 (H) 06/06/2023 1440   Hepatic Function Panel     Component Value Date/Time   PROT 6.6 06/06/2023 1440   ALBUMIN 4.4 06/06/2023 1440   AST 27 06/06/2023 1440   ALT 27 06/06/2023 1440   ALKPHOS 74 06/06/2023 1440   BILITOT 0.5 06/06/2023 1440   BILIDIR 0.12 07/03/2022 1041      Component Value Date/Time   TSH 3.010 06/06/2023 1440   Nutritional Lab Results  Component Value Date   VD25OH 25.3 (L) 06/06/2023    Attestations:   Reviewed by clinician on day of visit: allergies, medications, problem list, medical history, surgical history, family history, social history, and previous encounter notes pertinent to patient's obesity diagnosis.   I have spent 62 minutes in the care of the patient today including: preparing to see patient (e.g. review and interpretation of tests, old notes ), obtaining and/or reviewing separately obtained history, performing a medically appropriate examination or evaluation, counseling and educating the patient, ordering medications, test or procedures, documenting clinical information in the electronic or other health care record, and independently interpreting results and communicating results to the patient, family, or caregiver   I, Isabelle Course, acting as a medical scribe for Thomasene Lot, DO., have compiled all relevant documentation for today's office visit on behalf of Thomasene Lot, DO, while in the presence of [employer], DO.  I have reviewed the above documentation for accuracy and completeness, and I agree with the above. Eileen Morgan, D.O.  The 21st  Century Cures Act was signed into law in 2016 which includes the topic of electronic  health records.  This provides immediate access to information in MyChart.  This includes consultation notes, operative notes, office notes, lab results and pathology reports.  If you have any questions about what you read please let us know at your next visit so we can discuss your concerns and take corrective action if need be.  We are right here with you.

## 2023-06-27 ENCOUNTER — Other Ambulatory Visit: Payer: Self-pay | Admitting: Family Medicine

## 2023-06-27 DIAGNOSIS — G8929 Other chronic pain: Secondary | ICD-10-CM

## 2023-06-27 NOTE — Telephone Encounter (Signed)
 Please advise [facility]

## 2023-07-02 ENCOUNTER — Ambulatory Visit: Payer: Self-pay | Admitting: Family Medicine

## 2023-07-03 ENCOUNTER — Ambulatory Visit (INDEPENDENT_AMBULATORY_CARE_PROVIDER_SITE_OTHER): Payer: Medicare Other | Admitting: Nurse Practitioner

## 2023-07-03 ENCOUNTER — Encounter: Payer: Self-pay | Admitting: Nurse Practitioner

## 2023-07-03 VITALS — BP 132/68 | HR 58 | Temp 97.7°F | Wt 202.4 lb

## 2023-07-03 DIAGNOSIS — R399 Unspecified symptoms and signs involving the genitourinary system: Secondary | ICD-10-CM | POA: Diagnosis not present

## 2023-07-03 DIAGNOSIS — E538 Deficiency of other specified B group vitamins: Secondary | ICD-10-CM | POA: Diagnosis not present

## 2023-07-03 DIAGNOSIS — D696 Thrombocytopenia, unspecified: Secondary | ICD-10-CM | POA: Diagnosis not present

## 2023-07-03 LAB — POCT URINALYSIS DIP (CLINITEK)
Bilirubin, UA: NEGATIVE
Glucose, UA: NEGATIVE mg/dL
Ketones, POC UA: NEGATIVE mg/dL
Nitrite, UA: NEGATIVE
POC PROTEIN,UA: NEGATIVE
Spec Grav, UA: 1.025 (ref 1.010–1.025)
Urobilinogen, UA: 0.2 U/dL
pH, UA: 6.5 (ref 5.0–8.0)

## 2023-07-03 MED ORDER — SULFAMETHOXAZOLE-TRIMETHOPRIM 800-160 MG PO TABS
1.0000 | ORAL_TABLET | Freq: Two times a day (BID) | ORAL | 0 refills | Status: AC
Start: 2023-07-03 — End: 2023-07-06

## 2023-07-03 MED ORDER — CYANOCOBALAMIN 500 MCG PO TABS
500.0000 ug | ORAL_TABLET | Freq: Every day | ORAL | Status: AC
Start: 2023-07-03 — End: ?

## 2023-07-03 MED ORDER — FOLIC ACID 800 MCG PO TABS: 400.0000 ug | ORAL_TABLET | Freq: Every day | ORAL | 2 refills | Status: DC

## 2023-07-03 NOTE — Progress Notes (Signed)
 Subjective   Patient ID: Eileen Morgan, female    DOB: [birth year], 73 y.o.   MRN: [id number]  Chief Complaint  Patient presents with   Medical Management of Chronic Issues    Referring provider: Massie Maroon, FNP  Laureen Morgan is a 73 y.o. female with Past Medical History: No date: Back pain No date: COPD (chronic obstructive pulmonary disease) (HCC) No date: GERD (gastroesophageal reflux disease) No date: Pulmonary embolism (HCC) No date: Seizures (HCC)   HPI  Thrombocytopenia:  Patient presents today for follow-up on low platelet count.  She was recently seen by weight management group and labs showed a low platelet count.  Previous platelet count was 240 most current platelet count had dropped to 76. We will recheck labs today. Discussed with patient if platelet counts are still critical low she will be referred to hematology. Vitamin b12 was also low which could be contributing to low platelet count. Patient is currently not on vitamin B12 supplements. Will order vitamin b12 and folic acid. Denies f/c/s, n/v/d, hemoptysis, PND, leg swelling. Denies chest pain or edema.   UTI Symptoms:  Patient does complain today of UTI symptoms. Having painful urination. Dark urine past few days. Will check UA.    Allergies  Allergen Reactions   Penicillins Anaphylaxis   Yellow Jacket Venom     Immunization History  Administered Date(s) Administered   Fluad Quad(high Dose 65+) 03/27/2022   Influenza,inj,Quad PF,6+ Mos 04/23/2020, 03/28/2021   PFIZER(Purple Top)SARS-COV-2 Vaccination 04/23/2020, 05/11/2020   Pneumococcal Conjugate-13 06/21/2020   Pneumococcal Polysaccharide-23 01/01/2023   Tdap 03/28/2021    Tobacco History: Social History   Tobacco Use  Smoking Status Former   Current packs/day: 0.00   Average packs/day: 1 pack/day for 40.0 years (40.0 ttl pk-yrs)   Types: Cigarettes   Start date: 03/08/1979   Quit date: 03/08/2019   Years since quitting: 4.3   Smokeless Tobacco Never   Counseling given: Not Answered   Outpatient Encounter Medications as of 07/03/2023  Medication Sig   buPROPion (WELLBUTRIN SR) 200 MG 12 hr tablet TAKE 1 TABLET BY MOUTH TWICE A DAY   folic acid (FOLVITE) 800 MCG tablet Take 0.5 tablets (400 mcg total) by mouth daily.   hydrochlorothiazide (HYDRODIURIL) 25 MG tablet TAKE 1 TABLET (25 MG TOTAL) BY MOUTH DAILY.   levETIRAcetam (KEPPRA) 500 MG tablet TAKE 1 TABLET BY MOUTH TWICE A DAY   loratadine (CLARITIN) 10 MG tablet Take 10 mg by mouth as needed for allergies.   meloxicam (MOBIC) 15 MG tablet TAKE 1 TABLET (15 MG TOTAL) BY MOUTH DAILY.   omeprazole (PRILOSEC) 20 MG capsule TAKE 1 CAPSULE BY MOUTH EVERY DAY   oxybutynin (DITROPAN-XL) 10 MG 24 hr tablet TAKE 1 TABLET BY MOUTH EVERYDAY AT BEDTIME   OXYGEN Inhale 2 L into the lungs daily as needed (oxygen).   sulfamethoxazole-trimethoprim (BACTRIM DS) 800-160 MG tablet Take 1 tablet by mouth 2 (two) times daily for 3 days.   Vitamin D, Ergocalciferol, (DRISDOL) 1.25 MG (50000 UNIT) CAPS capsule Take 1 capsule (50,000 Units total) by mouth every 7 (seven) days.   [DISCONTINUED] cyanocobalamin (VITAMIN B12) 500 MCG tablet Take 1 tablet (500 mcg total) by mouth daily.   cyanocobalamin (VITAMIN B12) 500 MCG tablet Take 1 tablet (500 mcg total) by mouth daily.   No facility-administered encounter medications on file as of 07/03/2023.    Review of Systems  Review of Systems  Constitutional: Negative.   HENT: Negative.    Cardiovascular: Negative.  Gastrointestinal: Negative.   Allergic/Immunologic: Negative.   Neurological: Negative.   Psychiatric/Behavioral: Negative.       Objective:   BP 132/68   Pulse (!) 58   Temp 97.7 F (36.5 C)   Wt 202 lb 6.4 oz (91.8 kg)   SpO2 94%   BMI 37.02 kg/m   Wt Readings from Last 5 Encounters:  07/03/23 202 lb 6.4 oz (91.8 kg)  06/19/23 202 lb (91.6 kg)  06/06/23 208 lb (94.3 kg)  04/22/23 211 lb (95.7 kg)   01/17/23 215 lb 9.6 oz (97.8 kg)     Physical Exam Vitals and nursing note reviewed.  Constitutional:      General: She is not in acute distress.    Appearance: She is well-developed.  Cardiovascular:     Rate and Rhythm: Normal rate and regular rhythm.  Pulmonary:     Effort: Pulmonary effort is normal.     Breath sounds: Normal breath sounds.  Neurological:     Mental Status: She is alert and oriented to person, place, and time.       Assessment & Plan:   Urinary tract infection symptoms -     POCT URINALYSIS DIP (CLINITEK) -     Sulfamethoxazole-Trimethoprim; Take 1 tablet by mouth 2 (two) times daily for 3 days.  Dispense: 6 tablet; Refill: 0  Low platelet count (HCC) -     CBC -     Comprehensive metabolic panel -     Iron, TIBC and Ferritin Panel -     Cyanocobalamin; Take 1 tablet (500 mcg total) by mouth daily. -     Folic Acid; Take 0.5 tablets (400 mcg total) by mouth daily.  Dispense: 30 tablet; Refill: 2  B12 deficiency due to diet- new onset -     Cyanocobalamin; Take 1 tablet (500 mcg total) by mouth daily.   Patient Instructions  1. Urinary tract infection symptoms (Primary)  - POCT URINALYSIS DIP (CLINITEK)  2. Low platelet count (HCC)  - CBC - Comprehensive metabolic panel - Iron, TIBC and Ferritin Panel - cyanocobalamin (VITAMIN B12) 500 MCG tablet; Take 1 tablet (500 mcg total) by mouth daily. - folic acid (FOLVITE) 800 MCG tablet; Take 0.5 tablets (400 mcg total) by mouth daily.  Dispense: 30 tablet; Refill: 2  - will place referral to hematology if platelets are still critical low   3. B12 deficiency due to diet- new onset  - cyanocobalamin (VITAMIN B12) 500 MCG tablet; Take 1 tablet (500 mcg total) by mouth daily.  Follow up:  Follow up in 6 months   Thrombocytopenia Thrombocytopenia means that you have a low number of platelets in your blood. Platelets are tiny cells in the blood. When you bleed, they clump together at the cut  or injury to stop the bleeding. This is called blood clotting. If you do not have enough platelets, your blood may have trouble clotting. This may cause you to bleed and bruise very easily. What are the causes? This condition is caused by a low number of platelets in your blood. There are three main reasons for this: Your body not making enough platelets. This may be caused by: Bone marrow diseases. Disorders that are passed from parent to child (inherited). Certain cancer medicines or treatments. Infection from germs (bacteria or viruses). Alcoholism. Platelets not being released in the blood. This can be caused by: Having a spleen that is larger than normal. A condition called Gaucher disease. Your body destroying platelets too quickly. This  may be caused by: Certain autoimmune diseases. Some medicines that thin your blood. Certain blood clotting disorders. Certain bleeding disorders. Exposure to harmful (toxic) chemicals. Pregnancy. What are the signs or symptoms? Bruising easily. Bleeding from the nose or mouth. Heavy menstrual periods. Blood in the pee (urine), poop (stool), or vomit. A purple-red color to the skin (purpura). A rash that looks like pinpoint, purple-red spots (petechiae) on the lower legs. How is this treated? Treatment depends on the cause. Treatment may include: Treatment of another condition that is causing the low platelet count. Medicines to help protect your platelets from being destroyed. A replacement (transfusion) of platelets to stop or prevent bleeding. Surgery to take out the spleen. Follow these instructions at home: Medicines Take over-the-counter and prescription medicines only as told by your doctor. Do not take any medicines that have aspirin or NSAIDs, such as ibuprofen. Activity Avoid doing things that could hurt or bruise you. Take action to prevent falls. Do not play contact sports. Ask your doctor what activities are safe for  you. Take care not to burn yourself: When you use an iron. When you cook. Take care not to cut yourself: When you shave. When you use scissors, needles, knives, or other tools. General instructions  Check your skin and the inside of your mouth for bruises or blood as told by your doctor. Wear a medical alert bracelet that says that you have a bleeding disorder. Check to see if there is blood in your pee and poop. Do this as told by your doctor. Do not drink alcohol. If you do drink, limit the amount that you drink. Stay away from harmful (toxic) chemicals. Tell all of your doctors that you have this condition. Be sure to tell your dentist and eye doctor. Tell your dentist about your condition before you have your teeth cleaned. Keep all follow-up visits. Contact a doctor if: You have bruises and you do not know why. You have new symptoms. You have symptoms that get worse. You have a fever. Get help right away if: You have very bad bleeding anywhere on your body. You have blood in your vomit, pee, or poop. You have an injury to your head. You have a sudden, very bad headache. Summary Thrombocytopenia means that you have a low number of platelets in your blood. Platelets stick together to form a clot. Symptoms of this condition include getting bruises easily, bleeding from the mouth and nose, a purple-red color to the skin, and a rash. Take care not to cut or burn yourself. This information is not intended to replace advice given to you by your health care provider. Make sure you discuss any questions you have with your health care provider. Document Revised: 10/06/2020 Document Reviewed: 10/06/2020 Elsevier Patient Education  2024 [employer].   Return in about 3 months (around 09/30/2023).   [name], NP 07/03/2023

## 2023-07-03 NOTE — Addendum Note (Signed)
 Addended by: [name] on: 07/03/2023 10:28 AM   Modules accepted: Orders

## 2023-07-03 NOTE — Patient Instructions (Addendum)
 1. Urinary tract infection symptoms (Primary)  - POCT URINALYSIS DIP (CLINITEK)  2. Low platelet count (HCC)  - CBC - Comprehensive metabolic panel - Iron, TIBC and Ferritin Panel - cyanocobalamin (VITAMIN B12) 500 MCG tablet; Take 1 tablet (500 mcg total) by mouth daily. - folic acid (FOLVITE) 800 MCG tablet; Take 0.5 tablets (400 mcg total) by mouth daily.  Dispense: 30 tablet; Refill: 2  - will place referral to hematology if platelets are still critical low   3. B12 deficiency due to diet- new onset  - cyanocobalamin (VITAMIN B12) 500 MCG tablet; Take 1 tablet (500 mcg total) by mouth daily.  Follow up:  Follow up in 6 months   Thrombocytopenia Thrombocytopenia means that you have a low number of platelets in your blood. Platelets are tiny cells in the blood. When you bleed, they clump together at the cut or injury to stop the bleeding. This is called blood clotting. If you do not have enough platelets, your blood may have trouble clotting. This may cause you to bleed and bruise very easily. What are the causes? This condition is caused by a low number of platelets in your blood. There are three main reasons for this: Your body not making enough platelets. This may be caused by: Bone marrow diseases. Disorders that are passed from parent to child (inherited). Certain cancer medicines or treatments. Infection from germs (bacteria or viruses). Alcoholism. Platelets not being released in the blood. This can be caused by: Having a spleen that is larger than normal. A condition called Gaucher disease. Your body destroying platelets too quickly. This may be caused by: Certain autoimmune diseases. Some medicines that thin your blood. Certain blood clotting disorders. Certain bleeding disorders. Exposure to harmful (toxic) chemicals. Pregnancy. What are the signs or symptoms? Bruising easily. Bleeding from the nose or mouth. Heavy menstrual periods. Blood in the pee  (urine), poop (stool), or vomit. A purple-red color to the skin (purpura). A rash that looks like pinpoint, purple-red spots (petechiae) on the lower legs. How is this treated? Treatment depends on the cause. Treatment may include: Treatment of another condition that is causing the low platelet count. Medicines to help protect your platelets from being destroyed. A replacement (transfusion) of platelets to stop or prevent bleeding. Surgery to take out the spleen. Follow these instructions at home: Medicines Take over-the-counter and prescription medicines only as told by your doctor. Do not take any medicines that have aspirin or NSAIDs, such as ibuprofen. Activity Avoid doing things that could hurt or bruise you. Take action to prevent falls. Do not play contact sports. Ask your doctor what activities are safe for you. Take care not to burn yourself: When you use an iron. When you cook. Take care not to cut yourself: When you shave. When you use scissors, needles, knives, or other tools. General instructions  Check your skin and the inside of your mouth for bruises or blood as told by your doctor. Wear a medical alert bracelet that says that you have a bleeding disorder. Check to see if there is blood in your pee and poop. Do this as told by your doctor. Do not drink alcohol. If you do drink, limit the amount that you drink. Stay away from harmful (toxic) chemicals. Tell all of your doctors that you have this condition. Be sure to tell your dentist and eye doctor. Tell your dentist about your condition before you have your teeth cleaned. Keep all follow-up visits. Contact a doctor if: You  have bruises and you do not know why. You have new symptoms. You have symptoms that get worse. You have a fever. Get help right away if: You have very bad bleeding anywhere on your body. You have blood in your vomit, pee, or poop. You have an injury to your head. You have a sudden, very bad  headache. Summary Thrombocytopenia means that you have a low number of platelets in your blood. Platelets stick together to form a clot. Symptoms of this condition include getting bruises easily, bleeding from the mouth and nose, a purple-red color to the skin, and a rash. Take care not to cut or burn yourself. This information is not intended to replace advice given to you by your health care provider. Make sure you discuss any questions you have with your health care provider. Document Revised: 10/06/2020 Document Reviewed: 10/06/2020 Elsevier Patient Education  2024 [employer].

## 2023-07-04 LAB — CBC
Hematocrit: 46.7 % — ABNORMAL HIGH (ref 34.0–46.6)
Hemoglobin: 15.3 g/dL (ref 11.1–15.9)
MCH: 30.5 pg (ref 26.6–33.0)
MCHC: 32.8 g/dL (ref 31.5–35.7)
MCV: 93 fL (ref 79–97)
Platelets: 238 10*3/uL (ref 150–450)
RBC: 5.01 x10E6/uL (ref 3.77–5.28)
RDW: 12.8 % (ref 11.7–15.4)
WBC: 5.3 10*3/uL (ref 3.4–10.8)

## 2023-07-04 LAB — COMPREHENSIVE METABOLIC PANEL
ALT: 19 IU/L (ref 0–32)
AST: 16 IU/L (ref 0–40)
Albumin: 4.4 g/dL (ref 3.8–4.8)
Alkaline Phosphatase: 73 IU/L (ref 44–121)
BUN/Creatinine Ratio: 27 (ref 12–28)
BUN: 26 mg/dL (ref 8–27)
Bilirubin Total: 0.5 mg/dL (ref 0.0–1.2)
CO2: 27 mmol/L (ref 20–29)
Calcium: 9.6 mg/dL (ref 8.7–10.3)
Chloride: 100 mmol/L (ref 96–106)
Creatinine, Ser: 0.96 mg/dL (ref 0.57–1.00)
Globulin, Total: 2 g/dL (ref 1.5–4.5)
Glucose: 93 mg/dL (ref 70–99)
Potassium: 4.1 mmol/L (ref 3.5–5.2)
Sodium: 141 mmol/L (ref 134–144)
Total Protein: 6.4 g/dL (ref 6.0–8.5)
eGFR: 62 mL/min/{1.73_m2} (ref >59)

## 2023-07-04 LAB — IRON,TIBC AND FERRITIN PANEL
Ferritin: 155 ng/mL — ABNORMAL HIGH (ref 15–150)
Iron Saturation: 27 % (ref 15–55)
Iron: 105 ug/dL (ref 27–139)
Total Iron Binding Capacity: 386 ug/dL (ref 250–450)
UIBC: 281 ug/dL (ref 118–369)

## 2023-07-07 LAB — URINE CULTURE

## 2023-07-08 ENCOUNTER — Other Ambulatory Visit: Payer: Self-pay | Admitting: Nurse Practitioner

## 2023-07-08 ENCOUNTER — Ambulatory Visit (INDEPENDENT_AMBULATORY_CARE_PROVIDER_SITE_OTHER): Payer: Medicare Other | Admitting: Physician Assistant

## 2023-07-08 ENCOUNTER — Encounter (INDEPENDENT_AMBULATORY_CARE_PROVIDER_SITE_OTHER): Payer: Self-pay | Admitting: Physician Assistant

## 2023-07-08 ENCOUNTER — Ambulatory Visit: Payer: Medicare Other | Admitting: Nurse Practitioner

## 2023-07-08 VITALS — BP 137/82 | HR 88 | Temp 97.6°F | Ht 62.0 in | Wt 196.0 lb

## 2023-07-08 DIAGNOSIS — E782 Mixed hyperlipidemia: Secondary | ICD-10-CM

## 2023-07-08 DIAGNOSIS — E559 Vitamin D deficiency, unspecified: Secondary | ICD-10-CM | POA: Diagnosis not present

## 2023-07-08 DIAGNOSIS — D696 Thrombocytopenia, unspecified: Secondary | ICD-10-CM | POA: Diagnosis not present

## 2023-07-08 DIAGNOSIS — Z6835 Body mass index (BMI) 35.0-35.9, adult: Secondary | ICD-10-CM | POA: Diagnosis not present

## 2023-07-08 DIAGNOSIS — R7303 Prediabetes: Secondary | ICD-10-CM | POA: Diagnosis not present

## 2023-07-08 DIAGNOSIS — R399 Unspecified symptoms and signs involving the genitourinary system: Secondary | ICD-10-CM

## 2023-07-08 DIAGNOSIS — M85829 Other specified disorders of bone density and structure, unspecified upper arm: Secondary | ICD-10-CM

## 2023-07-08 DIAGNOSIS — E669 Obesity, unspecified: Secondary | ICD-10-CM | POA: Diagnosis not present

## 2023-07-08 MED ORDER — VITAMIN D (ERGOCALCIFEROL) 1.25 MG (50000 UNIT) PO CAPS: 50000.0000 [IU] | ORAL_CAPSULE | ORAL | 0 refills | Status: DC

## 2023-07-08 MED ORDER — SULFAMETHOXAZOLE-TRIMETHOPRIM 800-160 MG PO TABS: 1.0000 | ORAL_TABLET | Freq: Two times a day (BID) | ORAL | 0 refills | Status: AC

## 2023-07-08 NOTE — Progress Notes (Signed)
 SUBJECTIVE: Discussed the use of AI scribe software for clinical note transcription with the patient, who gave verbal consent to proceed.  Chief Complaint: Obesity  Interim History: She is down 6 lbs over the past few weeks.  Down 12 lbs overall since 06/06/23. TBW loss of 5.8%   Eileen Morgan is here to discuss her progress with her obesity treatment plan. She is on the Category 2 Plan and states she is following her eating plan approximately 100 % of the time. She states she is exercising walking at the store, but not consistent/formal exercise.  Eileen Morgan is a 73 year old female who presents for follow-up of her obesity treatment plan.  She has achieved a weight loss of six pounds and an increase of 0.2 pounds in muscle mass. Her body fat has decreased from 91.8 pounds to 85.8 pounds. She adheres to her dietary program, avoiding sugar-sweetened beverages and choosing sugar-free options like ginger ale and Coke Zero. She uses a time-marked water bottle to increase her water intake.  She has a history of thrombocytopenia, B12 deficiency, prediabetes, mixed hyperlipidemia, and vitamin D deficiency with osteopenia. She is currently taking vitamin D supplements without issues. Previously, there was concern about low platelet counts, but recent follow-up showed normal levels. She recalls a significant bruise after a blood draw, attributed to low platelets at the time. Her daughter, who works in a blood bank, had expressed concern about the low platelet count.  She quit smoking about five years ago, which she associates with her weight gain. She notes that she did not feel like she was eating more, but her weight increased to over 200 pounds. She is retired and spends a lot of time sitting, watching the news, and occasionally movies. She acknowledges the need to alter her eating habits as she ages to prevent further weight gain.  OBJECTIVE: Visit Diagnoses: Problem List Items Addressed This Visit      Vitamin D deficiency- with osteopenia   Relevant Medications   Vitamin D, Ergocalciferol, (DRISDOL) 1.25 MG (50000 UNIT) CAPS capsule   Thrombocytopenia (HCC) - Primary   Prediabetes- new onset   Mixed hyperlipidemia- new onset   Other Visit Diagnoses       Obesity (BMI 30-39.9), Starting BMI 38.03         Obesity Undergoing a weight management program with successful weight loss of 12 pounds. Body fat percentage decreased from 91.8 pounds to 85.8 pounds. Visceral fat rating improved from 16 to 14, with a goal of 12 or less. Compliant with dietary changes and increased water intake. - Continue adherence to the weight management program. - Encourage increased water intake using a time-marked water bottle. - Avoid sugar-sweetened beverages. - Consider incorporating walking as the weather improves.  Thrombocytopenia Thrombocytopenia with follow up platelet count of 238K, within normal limits.  Last CBC     Latest Ref Rng & Units 07/03/2023   10:06 AM 06/06/2023    2:40 PM 06/16/2019   11:50 AM  CBC  WBC 3.4 - 10.8 x10E3/uL 5.3  5.5  6.2   Hemoglobin 11.1 - 15.9 g/dL 60.4  54.0  98.1   Hematocrit 34.0 - 46.6 % 46.7  41.3  41.9   Platelets 150 - 450 x10E3/uL 238  76  240     Previous low platelet count possibly due to platelet clumping. There were concerns for thrombocytopenia related to treatment with Keppra for seizure prophylaxis, but as count is back in normal range and no recent changes to  medications, ? Spurious result or platelet clumping causing low platelet count.  - Monitor for signs of low platelets, such as easy bruising, epistaxis, or bleeding gums. - Follow up on platelet count periodically.   Hyperlipidemia LDL is not at goal. Medication(s): None Cardiovascular risk factors: advanced age (older than 47 for men, 71 for women), dyslipidemia, hypertension, obesity (BMI >= 30 kg/m2), sedentary lifestyle, and smoking/ tobacco exposure  Lab Results  Component Value Date    CHOL 197 06/06/2023   HDL 65 06/06/2023   LDLCALC 103 (H) 06/06/2023   TRIG 168 (H) 06/06/2023   CHOLHDL 3.0 06/06/2023   CHOLHDL 3.1 07/03/2022   CHOLHDL 2.8 11/14/2021   Lab Results  Component Value Date   ALT 19 07/03/2023   AST 16 07/03/2023   ALKPHOS 73 07/03/2023   BILITOT 0.5 07/03/2023   The 10-year ASCVD risk score (Arnett DK, et al., 2019) is: 21.4%   Values used to calculate the score:     Age: 89 years     Sex: Female     Is Non-Hispanic African American: No     Diabetic: No     Tobacco smoker: No     Systolic Blood Pressure: 137 mmHg     Is BP treated: Yes     HDL Cholesterol: 65 mg/dL     Total Cholesterol: 197 mg/dL  Plan: Likely should consider starting statin therapy if lipids not improved with current nutrition plan.  Continue to work on nutrition plan -decreasing simple carbohydrates, increasing lean proteins, decreasing saturated fats and cholesterol , avoiding trans fats and exercise as able to promote weight loss, improve lipids and decrease cardiovascular risks. Plan to recheck labs after about 3-4 months of following plan.   Prediabetes Last A1c was 5.8- not at goal/ Insulin 6.7- nearing goal.   Medication(s): None Polyphagia:No Lab Results  Component Value Date   HGBA1C 5.8 (H) 06/06/2023   HGBA1C 5.7 (A) 01/01/2023   HGBA1C 5.8 (A) 03/27/2022   HGBA1C 5.5 12/22/2020   Lab Results  Component Value Date   INSULIN 6.7 06/06/2023    Plan:  Continue working on nutrition plan to decrease simple carbohydrates, increase lean proteins and exercise to promote weight loss, improve glycemic control and prevent progression to Type 2 diabetes.  Reports very good control of appetite when eating on plan and no excessive hunger or cravings.    Vitamin D deficiency with osteopenia Vitamin D deficiency and osteopenia. Currently taking vitamin D supplements without issues. Last vitamin D Lab Results  Component Value Date   VD25OH 25.3 (L) 06/06/2023   Continue/refill Ergocalciferol 50,000 units weekly Low vitamin D levels can be associated with adiposity and may result in leptin resistance and weight gain. Also associated with fatigue.  Currently on vitamin D supplementation without any adverse effects such as nausea, vomiting or muscle weakness.  Recheck vitamin D level several times yearly to optimize supplementation/avoid over supplementation.     Follow-up  - Attend follow-up appointment with Doctor Opalski on March 17th. - Attend follow-up appointment  with me on March 31st at 2:30 PM.  Vitals Temp: 97.6 F (36.4 C) BP: 137/82 Pulse Rate: 88 SpO2: 91 %   Anthropometric Measurements Height: 5\' 2"  (1.575 m) Weight: 196 lb (88.9 kg) BMI (Calculated): 35.84 Weight at Last Visit: 202lb Weight Lost Since Last Visit: 6lb Weight Gained Since Last Visit: 0 Starting Weight: 208lb Total Weight Loss (lbs): 12 lb (5.443 kg) Peak Weight: 211lb   Body Composition  Body Fat %:  43.6 % Fat Mass (lbs): 85.8 lbs Muscle Mass (lbs): 105.2 lbs Total Body Water (lbs): 69.8 lbs Visceral Fat Rating : 14   Other Clinical Data Fasting: no Labs: no Today's Visit #: 3 Starting Date: 06/06/23     ASSESSMENT AND PLAN:  Diet: Eileen Morgan is currently in the action stage of change. As such, her goal is to continue with weight loss efforts and has agreed to the Category 2 Plan.   Exercise:  Older adults should follow the adult guidelines. When older adults cannot meet the adult guidelines, they should be as physically active as their abilities and conditions will allow., Older adults should do exercises that maintain or improve balance if they are at risk of falling. , and Older adults should determine their level of effort for physical activity relative to their level of fitness.  Behavior Modification:  We discussed the following Behavioral Modification Strategies today: increasing lean protein intake, decreasing simple carbohydrates,  increasing vegetables, increase H2O intake, decrease liquid calories, increase high fiber foods, meal planning and cooking strategies, emotional eating strategies , avoiding temptations, and planning for success. We discussed various medication options to help Eileen Morgan with her weight loss efforts and we both agreed to continue to work on nutritional and behavioral strategies to promote weight loss.  .  Return in about 2 weeks (around 07/22/2023).Marland Kitchen She was informed of the importance of frequent follow up visits to maximize her success with intensive lifestyle modifications for her multiple health conditions.  Attestation Statements:   Reviewed by clinician on day of visit: allergies, medications, problem list, medical history, surgical history, family history, social history, and previous encounter notes.   Time spent on visit including pre-visit chart review and post-visit care and charting was 38 minutes  Delora Gravatt,PA-C

## 2023-07-11 ENCOUNTER — Other Ambulatory Visit (INDEPENDENT_AMBULATORY_CARE_PROVIDER_SITE_OTHER): Payer: Self-pay | Admitting: Physician Assistant

## 2023-07-11 DIAGNOSIS — E559 Vitamin D deficiency, unspecified: Secondary | ICD-10-CM

## 2023-07-17 ENCOUNTER — Ambulatory Visit (INDEPENDENT_AMBULATORY_CARE_PROVIDER_SITE_OTHER): Payer: Medicare Other | Admitting: Family Medicine

## 2023-07-20 ENCOUNTER — Other Ambulatory Visit: Payer: Self-pay | Admitting: Nurse Practitioner

## 2023-07-20 DIAGNOSIS — N3289 Other specified disorders of bladder: Secondary | ICD-10-CM

## 2023-07-20 DIAGNOSIS — N3281 Overactive bladder: Secondary | ICD-10-CM

## 2023-07-22 ENCOUNTER — Encounter (INDEPENDENT_AMBULATORY_CARE_PROVIDER_SITE_OTHER): Payer: Self-pay | Admitting: Family Medicine

## 2023-07-22 ENCOUNTER — Ambulatory Visit (INDEPENDENT_AMBULATORY_CARE_PROVIDER_SITE_OTHER): Payer: Medicare Other | Admitting: Family Medicine

## 2023-07-22 VITALS — BP 147/79 | HR 51 | Temp 97.7°F | Ht 62.0 in | Wt 193.0 lb

## 2023-07-22 DIAGNOSIS — E538 Deficiency of other specified B group vitamins: Secondary | ICD-10-CM

## 2023-07-22 DIAGNOSIS — E559 Vitamin D deficiency, unspecified: Secondary | ICD-10-CM | POA: Diagnosis not present

## 2023-07-22 DIAGNOSIS — R7303 Prediabetes: Secondary | ICD-10-CM | POA: Diagnosis not present

## 2023-07-22 DIAGNOSIS — I10 Essential (primary) hypertension: Secondary | ICD-10-CM

## 2023-07-22 DIAGNOSIS — E669 Obesity, unspecified: Secondary | ICD-10-CM

## 2023-07-22 DIAGNOSIS — M8580 Other specified disorders of bone density and structure, unspecified site: Secondary | ICD-10-CM

## 2023-07-22 DIAGNOSIS — Z6836 Body mass index (BMI) 36.0-36.9, adult: Secondary | ICD-10-CM

## 2023-07-22 DIAGNOSIS — Z6835 Body mass index (BMI) 35.0-35.9, adult: Secondary | ICD-10-CM

## 2023-07-22 MED ORDER — VITAMIN D (ERGOCALCIFEROL) 1.25 MG (50000 UNIT) PO CAPS: 50000.0000 [IU] | ORAL_CAPSULE | ORAL | 0 refills | Status: DC
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
## 2023-07-30 ENCOUNTER — Ambulatory Visit (INDEPENDENT_AMBULATORY_CARE_PROVIDER_SITE_OTHER): Payer: Medicare Other | Admitting: Family Medicine

## 2023-08-05 ENCOUNTER — Ambulatory Visit (INDEPENDENT_AMBULATORY_CARE_PROVIDER_SITE_OTHER): Admitting: Physician Assistant

## 2023-08-06 ENCOUNTER — Other Ambulatory Visit (INDEPENDENT_AMBULATORY_CARE_PROVIDER_SITE_OTHER): Payer: Self-pay | Admitting: Family Medicine

## 2023-08-06 DIAGNOSIS — E559 Vitamin D deficiency, unspecified: Secondary | ICD-10-CM

## 2023-08-08 NOTE — Telephone Encounter (Signed)
 Patient is schedule 08/12/23

## 2023-08-12 ENCOUNTER — Other Ambulatory Visit: Payer: Self-pay

## 2023-08-12 ENCOUNTER — Ambulatory Visit (INDEPENDENT_AMBULATORY_CARE_PROVIDER_SITE_OTHER): Admitting: Physician Assistant

## 2023-08-12 ENCOUNTER — Encounter (INDEPENDENT_AMBULATORY_CARE_PROVIDER_SITE_OTHER): Payer: Self-pay | Admitting: Physician Assistant

## 2023-08-12 VITALS — BP 138/79 | HR 53 | Temp 97.7°F | Ht 62.0 in | Wt 189.0 lb

## 2023-08-12 DIAGNOSIS — R7303 Prediabetes: Secondary | ICD-10-CM

## 2023-08-12 DIAGNOSIS — Z6834 Body mass index (BMI) 34.0-34.9, adult: Secondary | ICD-10-CM

## 2023-08-12 DIAGNOSIS — M8580 Other specified disorders of bone density and structure, unspecified site: Secondary | ICD-10-CM

## 2023-08-12 DIAGNOSIS — E669 Obesity, unspecified: Secondary | ICD-10-CM | POA: Diagnosis not present

## 2023-08-12 DIAGNOSIS — E782 Mixed hyperlipidemia: Secondary | ICD-10-CM

## 2023-08-12 DIAGNOSIS — E559 Vitamin D deficiency, unspecified: Secondary | ICD-10-CM

## 2023-08-12 MED ORDER — VITAMIN D (ERGOCALCIFEROL) 1.25 MG (50000 UNIT) PO CAPS: 50000.0000 [IU] | ORAL_CAPSULE | ORAL | 0 refills | Status: DC

## 2023-08-12 NOTE — Progress Notes (Signed)
 SUBJECTIVE: Discussed the use of AI scribe software for clinical note transcription with the patient, who gave verbal consent to proceed.  Chief Complaint: Obesity  Interim History: She is down 4 lbs since last visit Down 19 lbs overall TBW loss of 9.13%  Eileen Morgan is here to discuss her progress with her obesity treatment plan. She is on the Category 2 Plan and states she is following her eating plan approximately 100 % of the time. She states she is exercising walking for 60 minutes 3 times per week.  Eileen Morgan is a 73 year old female who presents for follow-up of her obesity treatment plan.  She has been on bupropion 200 mg twice daily for medical weight loss and has successfully lost a total of 19 pounds since June 06, 2023, which is approximately 9.13% of her body weight. She notices a significant improvement in her physical activity, such as walking up steps and carrying items, as she experiences less dyspnea. She attended a family event where she managed to adhere mostly to her dietary plan despite dining out.  Her medical history includes prediabetes, mixed hyperlipidemia, vitamin D deficiency with osteopenia, B12 deficiency, and hypertension. For vitamin D deficiency, she takes ergocalciferol 50,000 units weekly without adverse effects like nausea or muscle weakness. She also takes B12 500 mcg orally daily, as her previous level was 364, which is on the lower end of normal. She recalls her mother also took vitamin D and B12, though she is unsure of the specifics.  For hypertension, she is on hydrochlorothiazide 25 mg daily.    In terms of dietary habits, she is mindful of her food choices, reducing intake of high-sugar fruits like bananas and grapes, and opting for melons, berries, and apples instead. She drinks more water and limits sweetened beverages, using Splenda as a sweetener. She feels less hungry and has reduced cravings, attributing this to her dietary changes and possibly  the bupropion.   Pharmacotherapy for weight loss: She is currently taking Wellbutrin SR 200 mg twice daily.    OBJECTIVE: Visit Diagnoses: Problem List Items Addressed This Visit     Vitamin D deficiency- with osteopenia   Relevant Medications   Vitamin D, Ergocalciferol, (DRISDOL) 1.25 MG (50000 UNIT) CAPS capsule   Prediabetes- new onset - Primary   Mixed hyperlipidemia- new onset   Other Visit Diagnoses       Obesity (BMI 30-39.9), Starting BMI 38.03         BMI 34.0-34.9,adult Current BMI 34.6         Obesity She has lost 19 pounds since June 06, 2023, approximately 9.13% of her total body weight, due to dietary changes and bupropion. She reports improved breathing during physical activities. She is advised to continue her weight loss journey by incorporating more walking into her routine, aiming for 20-30 minutes of walking two to three times a week. She is mindful of her diet, avoiding high-sugar fruits like bananas and grapes, and opting for healthier options like berries and melons. She is advised to limit intake of artificial sweeteners to two drinks per day and to focus on water intake. - Continue bupropion 200 mg twice daily for weight loss. - Encourage walking 20-30 minutes two to three times a week. - Advise on dietary choices, focusing on low-sugar fruits like berries and melons. - Limit artificial sweeteners to two drinks per day. - Increase water intake.  Hypertension Blood pressure is well-controlled at 138/79 mmHg with hydrochlorothiazide. Continued weight loss is expected  to further improve blood pressure control. - Continue hydrochlorothiazide 25 mg daily.  Vitamin D deficiency She is on ergocalciferol 50,000 units weekly with no adverse effects and reports improved energy levels. Vitamin D supplementation is expected to help with energy levels and potentially hair growth. - Continue ergocalciferol 50,000 units weekly. - Refill vitamin D prescription at  [facility].  Vitamin B12 deficiency She is on oral B12 supplementation, with a current level of 364, which is on the lower end of normal. The goal is to increase the level to 500 or more to improve energy levels. Advised to ensure she is taking 500 mcg of B12 daily. Excess B12 is not stored in the body and is excreted in urine, so over-supplementation is not necessary. - Continue B12 500 mcg orally daily. - Recheck B12 levels in a couple of months.     Prediabetes Last A1c was 5.8- not at goal. Insulin 6.7- nearing goal  Medication(s): None Polyphagia:No Lab Results  Component Value Date   HGBA1C 5.8 (H) 06/06/2023   HGBA1C 5.7 (A) 01/01/2023   HGBA1C 5.8 (A) 03/27/2022   HGBA1C 5.5 12/22/2020   Lab Results  Component Value Date   INSULIN 6.7 06/06/2023    Plan:  She has done very well on her nutrition plan, Down 19 lbs or 9.13% of total body weight.  Continue working on nutrition plan to decrease simple carbohydrates, increase lean proteins and exercise to promote weight loss, improve glycemic control and prevent progression to Type 2 diabetes.     Vitals Temp: 97.7 F (36.5 C) BP: 138/79 Pulse Rate: (!) 53 SpO2: 95 %   Anthropometric Measurements Height: 5\' 2"  (1.575 m) Weight: 189 lb (85.7 kg) BMI (Calculated): 34.56 Weight at Last Visit: 193 lb Weight Lost Since Last Visit: 4 lb Weight Gained Since Last Visit: 0 Starting Weight: 208 lb Total Weight Loss (lbs): 19 lb (8.618 kg) Peak Weight: 211 lb   Body Composition  Body Fat %: 42.4 % Fat Mass (lbs): 80.4 lbs Muscle Mass (lbs): 103.4 lbs Total Body Water (lbs): 68.6 lbs Visceral Fat Rating : 14   Other Clinical Data Fasting: no Labs: no Today's Visit #: 5 Starting Date: 06/06/23     ASSESSMENT AND PLAN:  Diet: [name] is currently in the action stage of change. As such, her goal is to continue with weight loss efforts. She has agreed to Category 2 Plan.  Exercise: [name] has been instructed to work  up to a goal of 150 minutes of combined cardio and strengthening exercise per week for weight loss and overall health benefits.   Behavior Modification:  We discussed the following Behavioral Modification Strategies today: increasing lean protein intake, decreasing simple carbohydrates, increasing vegetables, increase H2O intake, increase high fiber foods, meal planning and cooking strategies, avoiding temptations, and planning for success. Smart fruits hand out provided.  We discussed various medication options to help Eileen Morgan with her weight loss efforts and we both agreed to continue current plan and continue to work on nutritional and behavioral strategies to promote weight loss.  .  No follow-ups on file.Aaron Aas She was informed of the importance of frequent follow up visits to maximize her success with intensive lifestyle modifications for her multiple health conditions.  Attestation Statements:   Reviewed by clinician on day of visit: allergies, medications, problem list, medical history, surgical history, family history, social history, and previous encounter notes.   Time spent on visit including pre-visit chart review and post-visit care and charting was 28 minutes.  Nialah Saravia, PA-C

## 2023-09-02 ENCOUNTER — Other Ambulatory Visit (INDEPENDENT_AMBULATORY_CARE_PROVIDER_SITE_OTHER): Payer: Self-pay | Admitting: Physician Assistant

## 2023-09-02 DIAGNOSIS — E559 Vitamin D deficiency, unspecified: Secondary | ICD-10-CM

## 2023-09-05 ENCOUNTER — Encounter (INDEPENDENT_AMBULATORY_CARE_PROVIDER_SITE_OTHER): Payer: Self-pay | Admitting: Family Medicine

## 2023-09-05 ENCOUNTER — Ambulatory Visit (INDEPENDENT_AMBULATORY_CARE_PROVIDER_SITE_OTHER): Admitting: Family Medicine

## 2023-09-05 VITALS — BP 129/80 | HR 56 | Temp 97.7°F | Ht 62.0 in | Wt 191.0 lb

## 2023-09-05 DIAGNOSIS — R7303 Prediabetes: Secondary | ICD-10-CM

## 2023-09-05 DIAGNOSIS — E559 Vitamin D deficiency, unspecified: Secondary | ICD-10-CM | POA: Diagnosis not present

## 2023-09-05 DIAGNOSIS — E669 Obesity, unspecified: Secondary | ICD-10-CM

## 2023-09-05 DIAGNOSIS — Z6834 Body mass index (BMI) 34.0-34.9, adult: Secondary | ICD-10-CM

## 2023-09-05 DIAGNOSIS — I10 Essential (primary) hypertension: Secondary | ICD-10-CM | POA: Diagnosis not present

## 2023-09-05 MED ORDER — VITAMIN D (ERGOCALCIFEROL) 1.25 MG (50000 UNIT) PO CAPS: 50000.0000 [IU] | ORAL_CAPSULE | ORAL | 0 refills | Status: DC

## 2023-09-05 NOTE — Progress Notes (Signed)
 Eileen Morgan, D.O.  AB[facility], AB[facility] Specializing in Clinical Bariatric Medicine  Office located at: [address] [place], [place]  [address]   Assessment and Plan:  No orders of the defined types were placed in this encounter.   There are no discontinued medications.   No orders of the defined types were placed in this encounter.     FOR THE DISEASE OF OBESITY:  BMI 34.0-34.9,adult Current BMI 34.93 Obesity (BMI 30-39.9), Starting BMI 38.03 Assessment & Plan: Since last office visit on 08/12/2023 patient's  Muscle mass has increased by 0.6 lb. Fat mass has increased by 1.6 lb. Total body water has increased by 2 lb.  Counseling done on how various foods will affect these numbers and how to maximize success  Total lbs lost to date: 17 lbs  Total weight loss percentage to date: 8.17%    Recommended Dietary Goals Eileen Morgan is currently in the action stage of change. As such, her goal is to continue weight management plan.  She has agreed to: START journaling 1600 calories and 105+ grams protein per day with the CAT 3 eating plan with B/L options and only 100 snack calories as a guide.    Behavioral Intervention We discussed the following today: high protein oatmeal options,  work on tracking and journaling calories using tracking application  Additional resources provided today: Handout on Examples of Low Glycemic Index and Low Calorie Fruits & Vegetables, Handout on CAT 3 meal plan, Handout on CAT 3-4 breakfast options, Handout on CAT 3-4 lunch options, and Physician provided patient with handouts and personalized instruction on tracking and journaling using Apps (or how to handwrite in notebook) and using logs provided   Evidence-based interventions for health behavior change were utilized today including the discussion of self monitoring techniques, problem-solving barriers and SMART goal setting techniques.   Regarding patient's less desirable eating habits and patterns, we  employed the technique of small changes.   Pt will: bring in journaling log to next OV   Recommended Physical Activity Goals Eileen Morgan has been advised to work up to 300-450 minutes of moderate intensity aerobic activity a week and strengthening exercises 2-3 times per week for cardiovascular health, weight loss maintenance and preservation of muscle mass.   She has agreed to : continue to gradually increase the amount and intensity of walking   Pharmacotherapy We both agreed to : continue with nutritional and behavioral strategies   ASSOCIATED CONDITIONS ADDRESSED TODAY:  Prediabetes Assessment & Plan: Most recent A1c and fasting insulin :  Lab Results  Component Value Date   HGBA1C 5.8 (H) 06/06/2023   HGBA1C 5.7 (A) 01/01/2023   HGBA1C 5.8 (A) 03/27/2022   INSULIN  6.7 06/06/2023   Diet/lifestyle approach. Her hunger and cravings are controlled when following her prudent nutritional plan. Begin journaling plan low on processed crabs and simple sugars and high in lean proteins. Continue nutritional & behavioral strategies. Losing 10% or more of body weight may improve condition.   Vitamin D  deficiency- (with osteopenia) Assessment & Plan: Most recent Vitamin D : Lab Results  Component Value Date   VD25OH 25.3 (L) 06/06/2023   Doing well on once weekly strength VD. Continue regimen and wt loss efforts. Recheck periodically.   Hypertension, unspecified type Assessment & Plan: Last 3 blood pressure readings in our office are as follows: BP Readings from Last 3 Encounters:  09/05/23 129/80  08/12/23 138/79  07/22/23 (!) 147/79   The 10-year ASCVD risk score (Arnett DK, et al., 2019)  is: 19.2%  Lab Results  Component Value Date   CREATININE 0.96 07/03/2023   Blood pressure is controlled today. Continue hydrochlorothiazide  25 mg daily. Start journaling plan. Increase exercise as able.    Follow up:   Return 09/25/2023 with Shawn Rayburn PA. She was informed of the  importance of frequent follow up visits to maximize her success with intensive lifestyle modifications for her multiple health conditions.  Subjective:   Chief complaint: Obesity Eileen Morgan is here to discuss her progress with her obesity treatment plan. She is on the Category 2 eating plan and states she is following her eating plan approximately 90% of the time. She states she is walking 15 minutes 2 days per week.  Interval History:  Eileen Morgan is here for a follow up office visit. Since last OV on 08/12/2023, pt is up 2 lbs. Reports some off plan eating over Easter holiday. Is trying to eat more chicken and limit her red meat intake. Expresses interest in journaling her calories and grams of protein.   Pharmacotherapy that aid with weight loss: Taking  Wellbutrin  SR 200 mg twice daily .   Review of Systems:  Pertinent positives were addressed with patient today.Reviewed by clinician on day of visit: allergies, medications, problem list, medical history, surgical history, family history, social history, and previous encounter notes.  Weight Summary and Biometrics   Weight Lost Since Last Visit: 0lb  Weight Gained Since Last Visit: 2lb   Vitals Temp: 97.7 F (36.5 C) BP: 129/80 Pulse Rate: (!) 56 SpO2: 97 %   Anthropometric Measurements Height: 5\' 2"  (1.575 m) Weight: 191 lb (86.6 kg) BMI (Calculated): 34.93 Weight at Last Visit: 189lb Weight Lost Since Last Visit: 0lb Weight Gained Since Last Visit: 2lb Starting Weight: 208lb Total Weight Loss (lbs): 17 lb (7.711 kg) Peak Weight: 211lb   Body Composition  Body Fat %: 42.8 % Fat Mass (lbs): 82 lbs Muscle Mass (lbs): 104 lbs Total Body Water (lbs): 70.6 lbs Visceral Fat Rating : 14   Other Clinical Data Fasting: No Labs: no Today's Visit #: 6 Starting Date: 06/06/23   Objective:   PHYSICAL EXAM: Blood pressure 129/80, pulse (!) 56, temperature 97.7 F (36.5 C), height 5\' 2"  (1.575 m), weight 191 lb (86.6 kg),  SpO2 97%. Body mass index is 34.93 kg/m.  General: she is overweight, cooperative and in no acute distress. PSYCH: Has normal mood, affect and thought process.   HEENT: EOMI, sclerae are anicteric. Lungs: Normal breathing effort, no conversational dyspnea. Extremities: Moves * 4 Neurologic: A and O * 3, good insight  DIAGNOSTIC DATA REVIEWED: BMET    Component Value Date/Time   NA 141 07/03/2023 1006   K 4.1 07/03/2023 1006   CL 100 07/03/2023 1006   CO2 27 07/03/2023 1006   GLUCOSE 93 07/03/2023 1006   GLUCOSE 169 (H) 03/21/2019 0356   BUN 26 07/03/2023 1006   CREATININE 0.96 07/03/2023 1006   CALCIUM 9.6 07/03/2023 1006   GFRNONAA 87 06/16/2019 1150   GFRAA 100 06/16/2019 1150   Lab Results  Component Value Date   HGBA1C 5.8 (H) 06/06/2023   HGBA1C 5.5 12/22/2020   Lab Results  Component Value Date   INSULIN  6.7 06/06/2023   Lab Results  Component Value Date   TSH 3.010 06/06/2023   CBC    Component Value Date/Time   WBC 5.3 07/03/2023 1006   WBC 5.9 03/21/2019 0356   RBC 5.01 07/03/2023 1006   RBC 4.49 03/21/2019 0356  HGB 15.3 07/03/2023 1006   HCT 46.7 (H) 07/03/2023 1006   PLT 238 07/03/2023 1006   MCV 93 07/03/2023 1006   MCH 30.5 07/03/2023 1006   MCH 29.6 03/21/2019 0356   MCHC 32.8 07/03/2023 1006   MCHC 32.0 03/21/2019 0356   RDW 12.8 07/03/2023 1006   Iron Studies    Component Value Date/Time   IRON 105 07/03/2023 1006   TIBC 386 07/03/2023 1006   FERRITIN 155 (H) 07/03/2023 1006   IRONPCTSAT 27 07/03/2023 1006   Lipid Panel     Component Value Date/Time   CHOL 197 06/06/2023 1440   TRIG 168 (H) 06/06/2023 1440   HDL 65 06/06/2023 1440   CHOLHDL 3.0 06/06/2023 1440   LDLCALC 103 (H) 06/06/2023 1440   Hepatic Function Panel     Component Value Date/Time   PROT 6.4 07/03/2023 1006   ALBUMIN 4.4 07/03/2023 1006   AST 16 07/03/2023 1006   ALT 19 07/03/2023 1006   ALKPHOS 73 07/03/2023 1006   BILITOT 0.5 07/03/2023 1006    BILIDIR 0.12 07/03/2022 1041      Component Value Date/Time   TSH 3.010 06/06/2023 1440   Nutritional Lab Results  Component Value Date   VD25OH 25.3 (L) 06/06/2023    Attestations:   I, Special Puri, acting as a Stage manager for [employer], DO., have compiled all relevant documentation for today's office visit on behalf of Marceil Sensor, DO, while in the presence of [employer], DO.  I have spent X minutes in the care of the patient today including: preparing to see patient (e.g. review and interpretation of tests, old notes ), obtaining and/or reviewing separately obtained history, performing a medically appropriate examination or evaluation, counseling and educating the patient, ordering medications, test or procedures, documenting clinical information in the electronic or other health care record, and independently interpreting results and communicating results to the patient, family, or caregiver   I have reviewed the above documentation for accuracy and completeness, and I agree with the above. Eileen Morgan, D.O.  The 21st Century Cures Act was signed into law in 2016 which includes the topic of electronic health records.  This provides immediate access to information in MyChart.  This includes consultation notes, operative notes, office notes, lab results and pathology reports.  If you have any questions about what you read please let us  know at your next visit so we can discuss your concerns and take corrective action if need be.  We are right here with you.

## 2023-09-21 ENCOUNTER — Other Ambulatory Visit: Payer: Self-pay | Admitting: Family Medicine

## 2023-09-21 DIAGNOSIS — K219 Gastro-esophageal reflux disease without esophagitis: Secondary | ICD-10-CM

## 2023-09-25 ENCOUNTER — Ambulatory Visit (INDEPENDENT_AMBULATORY_CARE_PROVIDER_SITE_OTHER): Admitting: Physician Assistant

## 2023-10-02 ENCOUNTER — Ambulatory Visit: Payer: Self-pay | Admitting: Nurse Practitioner

## 2023-10-07 ENCOUNTER — Encounter: Payer: Self-pay | Admitting: Acute Care

## 2023-10-15 ENCOUNTER — Ambulatory Visit (INDEPENDENT_AMBULATORY_CARE_PROVIDER_SITE_OTHER): Admitting: Physician Assistant

## 2023-10-15 ENCOUNTER — Encounter (INDEPENDENT_AMBULATORY_CARE_PROVIDER_SITE_OTHER): Payer: Self-pay | Admitting: Physician Assistant

## 2023-10-15 VITALS — BP 153/83 | HR 56 | Temp 97.6°F | Ht 62.0 in | Wt 186.0 lb

## 2023-10-15 DIAGNOSIS — I10 Essential (primary) hypertension: Secondary | ICD-10-CM

## 2023-10-15 DIAGNOSIS — E559 Vitamin D deficiency, unspecified: Secondary | ICD-10-CM | POA: Diagnosis not present

## 2023-10-15 DIAGNOSIS — Z6834 Body mass index (BMI) 34.0-34.9, adult: Secondary | ICD-10-CM | POA: Diagnosis not present

## 2023-10-15 DIAGNOSIS — E669 Obesity, unspecified: Secondary | ICD-10-CM

## 2023-10-15 DIAGNOSIS — R7303 Prediabetes: Secondary | ICD-10-CM

## 2023-10-15 DIAGNOSIS — M8580 Other specified disorders of bone density and structure, unspecified site: Secondary | ICD-10-CM

## 2023-10-15 MED ORDER — VITAMIN D (ERGOCALCIFEROL) 1.25 MG (50000 UNIT) PO CAPS: 50000.0000 [IU] | ORAL_CAPSULE | ORAL | 0 refills | Status: DC

## 2023-10-15 NOTE — Progress Notes (Signed)
 SUBJECTIVE: Discussed the use of AI scribe software for clinical note transcription with the patient, who gave verbal consent to proceed.  Chief Complaint: Obesity  Interim History: She is down 5 lbs since her last visit Down 22 lbs since last visit TBW loss of 10.6%  Eileen Morgan is here to discuss her progress with her obesity treatment plan. She is on the 1600 calories and 105+ grams protein per day with the CAT 3 eating plan with B/L options and only 100 snack calories as a guide.    and states she is following her eating plan approximately 80-100 % of the time. She states she is exercising walking the stairs at home for 30 minutes 7 times per week. Eileen Morgan is a 73 year old female who presents for follow-up of her obesity treatment plan.  She has lost approximately 5 pounds since her last visit, with a reduction in adipose tissue but a slight decrease in muscle mass.  She attributes some of her weight loss to being more mindful of her protein intake and trying to make healthier choices when possible.  Her current exercise routine primarily involves walking, although she acknowledges not doing as much as she probably should. She stays active by keeping busy with daily activities and caring for her 75 year old grandson, which includes swimming and other activities that keep her moving.  She has a history of prediabetes, hypertension, mixed hyperlipidemia, and vitamin D  deficiency. She is currently taking vitamin D  supplements without any issues such as nausea or muscle weakness. She reports that she may have missed her blood pressure medication today and consumed salted popcorn, which she thinks could affect her blood pressure as it is elevated today.  She has been living in [place] for about five years and is considering participating in local exercise programs to enhance her physical activity. She expresses some difficulty with technology, such as using GPS and online services, and relies  on her grandson for assistance. We discussed the [place] [employer] and [employer] [facility] program and also the [facility] right start programs to try and get into a regular exercise routine.  Fasting labs for OV at end of July OBJECTIVE: Visit Diagnoses: Problem List Items Addressed This Visit     Hypertension   Vitamin D  deficiency- with osteopenia   Relevant Medications   Vitamin D , Ergocalciferol , (DRISDOL ) 1.25 MG (50000 UNIT) CAPS capsule   Prediabetes- new onset - Primary   Other Visit Diagnoses       Obesity (BMI 30-39.9), Starting BMI 38.03         Obesity Eileen Morgan, a 73 year old female, has successfully lost weight, reducing adipose tissue while mostly maintaining muscle mass. Her current adipose mass is 78.4 pounds, down from 82 pounds.  Walking has been her primary exercise, contributing to weight loss and improved mobility.  Incorporating light weightlifting is suggested to build muscle and improve balance, crucial for fall prevention. The [facility] [employer] and [employer] programs are recommended as structured exercise options, with [employer] being free and designed for seniors. - Encourage continued walking and consider adding light weightlifting exercises. - Recommend participation in structured exercise programs such as the Right Start program or the [facility] program. - Monitor weight loss progress and encourage gradual weight loss for long-term maintenance.  Prediabetes Last A1c was 5.8- not at goal and unchanged. Insulin  6.7- nearing goal.   Medication(s): None Polyphagia:No Lab Results  Component Value Date   HGBA1C 5.8 (H) 06/06/2023   HGBA1C 5.7 (A) 01/01/2023  HGBA1C 5.8 (A) 03/27/2022   HGBA1C 5.5 12/22/2020   Lab Results  Component Value Date   INSULIN  6.7 06/06/2023    Plan:  A1c stable/still in prediabetic range and mild insulin  resistance. Continue working on nutrition plan to decrease simple carbohydrates, increase lean proteins and exercise to promote weight loss, improve  glycemic control and prevent progression to Type 2 diabetes.  Plan to recheck labs in late July.    Hypertension Blood pressure was slightly elevated, possibly due to missed medication and dietary factors such as salted popcorn. Historically well-controlled, recent weight loss has improved her blood pressure readings. Advise monitoring salt intake and ensuring regular medication adherence. A target sodium intake of less than 2300 mg per day is recommended. She is taking hydrochlorothiazide  25 mg daily. Reports no side effects.  BP Readings from Last 3 Encounters:  10/15/23 (!) 153/83  09/05/23 129/80  08/12/23 138/79   Lab Results  Component Value Date   NA 141 07/03/2023   CL 100 07/03/2023   K 4.1 07/03/2023   CO2 27 07/03/2023   BUN 26 07/03/2023   CREATININE 0.96 07/03/2023   EGFR 62 07/03/2023   CALCIUM 9.6 07/03/2023   ALBUMIN 4.4 07/03/2023   GLUCOSE 93 07/03/2023   Continue hydrochlorothiazide  25 mg daily.  Continue to work on nutrition plan to promote weight loss and improve BP control.  - Ensure adherence to antihypertensive medication regimen. - Monitor dietary salt intake, aiming for less than 2300 mg of sodium per day. - Recheck blood pressure at next visit.  Vitamin D  deficiency . She is taking vitamin D  supplements- Ergocalciferol  50,000 units once weekly without adverse effects such as nausea, vomiting, or muscle weakness. Levels are not at goal.  Last vitamin D  Lab Results  Component Value Date   VD25OH 25.3 (L) 06/06/2023  - -Continue Eileen Morgan Ergocalciferol  50,000 units once weekly x 30 days Low vitamin D  levels can be associated with adiposity and may result in leptin resistance and weight gain. Also associated with fatigue.  Currently on vitamin D  supplementation without any adverse effects such as nausea, vomiting or muscle weakness.  Recheck vitamin D  level late July appt to optimize supplementation/avoid over supplementation.  Meds ordered this  encounter  Medications   Vitamin D , Ergocalciferol , (DRISDOL ) 1.25 MG (50000 UNIT) CAPS capsule    Sig: Take 1 capsule (50,000 Units total) by mouth every 7 (seven) days.    Dispense:  4 capsule    Refill:  0    Vitals Temp: 97.6 F (36.4 C) BP: (!) 153/83 Pulse Rate: (!) 56 SpO2: 95 %   Anthropometric Measurements Height: 5\' 2"  (1.575 m) Weight: 186 lb (84.4 kg) BMI (Calculated): 34.01 Weight at Last Visit: 191 lb Weight Lost Since Last Visit: 5 lb Weight Gained Since Last Visit: 0 Starting Weight: 208 lb Total Weight Loss (lbs): 22 lb (9.979 kg) Peak Weight: 211 lb   Body Composition  Body Fat %: 42.2 % Fat Mass (lbs): 78.4 lbs Muscle Mass (lbs): 102.2 lbs Total Body Water (lbs): 68.2 lbs Visceral Fat Rating : 13   Other Clinical Data Fasting: no Labs: no Today's Visit #: 7 Starting Date: 06/06/23     ASSESSMENT AND PLAN:  Diet: Eileen Morgan is currently in the action stage of change. As such, her goal is to continue with weight loss efforts. She has agreed to Category 3 Plan, keeping a food journal and adhering to recommended goals of 1600 calories and 105 grams of  protein, and B/L options  and 100 snack calories.  Exercise: Eileen Morgan has been instructed to work up to a goal of 150 minutes of combined cardio and strengthening exercise per week for weight loss and overall health benefits.   Behavior Modification:  We discussed the following Behavioral Modification Strategies today: increasing lean protein intake, decreasing simple carbohydrates, increasing vegetables, increase H2O intake, decreasing sodium intake, increase high fiber foods, meal planning and cooking strategies, avoiding temptations, planning for success, and keep a strict food journal. We discussed various medication options to help Eileen Morgan with her weight loss efforts and we both agreed to continue current treatment plan, continue to work on nutritional and behavioral strategies to promote weight loss.   .  Return in about 3 weeks (around 11/05/2023).Aaron Aas She was informed of the importance of frequent follow up visits to maximize her success with intensive lifestyle modifications for her multiple health conditions.  Attestation Statements:   Reviewed by clinician on day of visit: allergies, medications, problem list, medical history, surgical history, family history, social history, and previous encounter notes.   Time spent on visit including pre-visit chart review and post-visit care and charting was 34 minutes.    Eileen Shugart, PA-C

## 2023-10-17 ENCOUNTER — Other Ambulatory Visit: Payer: Self-pay | Admitting: Nurse Practitioner

## 2023-10-17 DIAGNOSIS — N3289 Other specified disorders of bladder: Secondary | ICD-10-CM

## 2023-10-17 DIAGNOSIS — N3281 Overactive bladder: Secondary | ICD-10-CM

## 2023-10-18 ENCOUNTER — Other Ambulatory Visit: Payer: Self-pay | Admitting: Nurse Practitioner

## 2023-10-18 DIAGNOSIS — Z8669 Personal history of other diseases of the nervous system and sense organs: Secondary | ICD-10-CM

## 2023-10-18 NOTE — Telephone Encounter (Signed)
 Please advise in [place] absence. KH

## 2023-10-26 ENCOUNTER — Other Ambulatory Visit: Payer: Self-pay | Admitting: Nurse Practitioner

## 2023-10-26 DIAGNOSIS — G8929 Other chronic pain: Secondary | ICD-10-CM

## 2023-10-28 ENCOUNTER — Ambulatory Visit (INDEPENDENT_AMBULATORY_CARE_PROVIDER_SITE_OTHER): Payer: Self-pay | Admitting: Nurse Practitioner

## 2023-10-28 ENCOUNTER — Encounter: Payer: Self-pay | Admitting: Nurse Practitioner

## 2023-10-28 VITALS — BP 117/54 | HR 63 | Temp 97.9°F | Wt 190.0 lb

## 2023-10-28 DIAGNOSIS — I10 Essential (primary) hypertension: Secondary | ICD-10-CM | POA: Diagnosis not present

## 2023-10-28 DIAGNOSIS — Z87898 Personal history of other specified conditions: Secondary | ICD-10-CM

## 2023-10-28 DIAGNOSIS — E559 Vitamin D deficiency, unspecified: Secondary | ICD-10-CM | POA: Diagnosis not present

## 2023-10-28 DIAGNOSIS — D696 Thrombocytopenia, unspecified: Secondary | ICD-10-CM

## 2023-10-28 MED ORDER — FOLIC ACID 800 MCG PO TABS: 400.0000 ug | ORAL_TABLET | Freq: Every day | ORAL | 2 refills | Status: AC

## 2023-10-28 NOTE — Progress Notes (Signed)
 Subjective   Patient ID: Eileen Morgan, female    DOB: June 26, 1950, 73 y.o.   MRN: [id number]  Chief Complaint  Patient presents with   Medical Management of Chronic Issues    Referring provider: Oley Bascom RAMAN, NP  Giuseppina Quinones is a 73 y.o. female with Past Medical History: No date: Back pain No date: COPD (chronic obstructive pulmonary disease) (HCC) No date: GERD (gastroesophageal reflux disease) No date: Pulmonary embolism (HCC) No date: Seizures (HCC)   HPI  Thrombocytopenia:   Patient presents today for follow-up on low platelet count.  She was recently seen by weight management group and labs showed a low platelet count.  Previous platelet count was normal. We will recheck labs today. Denies f/c/s, n/v/d, hemoptysis, PND, leg swelling. Denies chest pain or edema.     Allergies  Allergen Reactions   Penicillins Anaphylaxis   Yellow Jacket Venom     Immunization History  Administered Date(s) Administered   Fluad Quad(high Dose 65+) 03/27/2022   Influenza,inj,Quad PF,6+ Mos 04/23/2020, 03/28/2021   PFIZER(Purple Top)SARS-COV-2 Vaccination 04/23/2020, 05/11/2020   Pneumococcal Conjugate-13 06/21/2020   Pneumococcal Polysaccharide-23 01/01/2023   Tdap 03/28/2021    Tobacco History: Social History   Tobacco Use  Smoking Status Former   Current packs/day: 0.00   Average packs/day: 1 pack/day for 40.0 years (40.0 ttl pk-yrs)   Types: Cigarettes   Start date: 03/08/1979   Quit date: 03/08/2019   Years since quitting: 4.6  Smokeless Tobacco Never   Counseling given: Not Answered   Outpatient Encounter Medications as of 10/28/2023  Medication Sig   buPROPion  (WELLBUTRIN  SR) 200 MG 12 hr tablet TAKE 1 TABLET BY MOUTH TWICE A DAY   cyanocobalamin  (VITAMIN B12) 500 MCG tablet Take 1 tablet (500 mcg total) by mouth daily.   hydrochlorothiazide  (HYDRODIURIL ) 25 MG tablet TAKE 1 TABLET (25 MG TOTAL) BY MOUTH DAILY.   levETIRAcetam  (KEPPRA ) 500 MG tablet TAKE 1  TABLET BY MOUTH TWICE A DAY   loratadine  (CLARITIN ) 10 MG tablet Take 10 mg by mouth as needed for allergies.   meloxicam  (MOBIC ) 15 MG tablet TAKE 1 TABLET (15 MG TOTAL) BY MOUTH DAILY.   omeprazole  (PRILOSEC) 20 MG capsule TAKE 1 CAPSULE BY MOUTH EVERY DAY   oxybutynin  (DITROPAN -XL) 10 MG 24 hr tablet TAKE 1 TABLET BY MOUTH EVERYDAY AT BEDTIME   OXYGEN  Inhale 2 L into the lungs daily as needed (oxygen ).   Vitamin D , Ergocalciferol , (DRISDOL ) 1.25 MG (50000 UNIT) CAPS capsule Take 1 capsule (50,000 Units total) by mouth every 7 (seven) days.   folic acid  (FOLVITE ) 800 MCG tablet Take 0.5 tablets (400 mcg total) by mouth daily.   [DISCONTINUED] folic acid  (FOLVITE ) 800 MCG tablet Take 0.5 tablets (400 mcg total) by mouth daily. (Patient not taking: Reported on 10/28/2023)   No facility-administered encounter medications on file as of 10/28/2023.    Review of Systems  Review of Systems  Constitutional: Negative.   HENT: Negative.    Cardiovascular: Negative.   Gastrointestinal: Negative.   Allergic/Immunologic: Negative.   Neurological: Negative.   Psychiatric/Behavioral: Negative.       Objective:   BP (!) 117/54   Pulse 63   Temp 97.9 F (36.6 C) (Oral)   Wt 190 lb (86.2 kg)   SpO2 93%   BMI 34.75 kg/m   Wt Readings from Last 5 Encounters:  10/28/23 190 lb (86.2 kg)  10/15/23 186 lb (84.4 kg)  09/05/23 191 lb (86.6 kg)  08/12/23 189 lb (85.7  kg)  07/22/23 193 lb (87.5 kg)     Physical Exam Vitals and nursing note reviewed.  Constitutional:      General: She is not in acute distress.    Appearance: She is well-developed.   Cardiovascular:     Rate and Rhythm: Normal rate and regular rhythm.  Pulmonary:     Effort: Pulmonary effort is normal.     Breath sounds: Normal breath sounds.   Neurological:     Mental Status: She is alert and oriented to person, place, and time.       Assessment & Plan:   Vitamin D  deficiency -     VITAMIN D  25 Hydroxy (Vit-D  Deficiency, Fractures)  Essential hypertension -     CBC -     Comprehensive metabolic panel with GFR -     Iron  Low platelet count (HCC) -     CBC -     Comprehensive metabolic panel with GFR -     Iron -     Folic Acid ; Take 0.5 tablets (400 mcg total) by mouth daily.  Dispense: 30 tablet; Refill: 2  History of seizures -     Ambulatory referral to Neurology     Return in about 6 months (around 04/28/2024).   Bascom GORMAN Borer, NP 10/28/2023

## 2023-10-28 NOTE — Telephone Encounter (Signed)
 Please advise [facility]

## 2023-10-28 NOTE — Patient Instructions (Signed)
 1. Vitamin D  deficiency (Primary)  - Vitamin D , 25-hydroxy  2. Essential hypertension  - CBC - Comprehensive metabolic panel with GFR - Iron  3. Low platelet count (HCC)  - CBC - Comprehensive metabolic panel with GFR - Iron

## 2023-10-29 LAB — COMPREHENSIVE METABOLIC PANEL WITH GFR
ALT: 21 IU/L (ref 0–32)
AST: 20 IU/L (ref 0–40)
Albumin: 4 g/dL (ref 3.8–4.8)
Alkaline Phosphatase: 82 IU/L (ref 44–121)
BUN/Creatinine Ratio: 19 (ref 12–28)
BUN: 19 mg/dL (ref 8–27)
Bilirubin Total: 0.4 mg/dL (ref 0.0–1.2)
CO2: 26 mmol/L (ref 20–29)
Calcium: 9.3 mg/dL (ref 8.7–10.3)
Chloride: 102 mmol/L (ref 96–106)
Creatinine, Ser: 1 mg/dL (ref 0.57–1.00)
Globulin, Total: 2.1 g/dL (ref 1.5–4.5)
Glucose: 87 mg/dL (ref 70–99)
Potassium: 4.6 mmol/L (ref 3.5–5.2)
Sodium: 141 mmol/L (ref 134–144)
Total Protein: 6.1 g/dL (ref 6.0–8.5)
eGFR: 59 mL/min/{1.73_m2} — ABNORMAL LOW (ref >59)

## 2023-10-29 LAB — CBC
Hematocrit: 44.8 % (ref 34.0–46.6)
Hemoglobin: 14.8 g/dL (ref 11.1–15.9)
MCH: 31.5 pg (ref 26.6–33.0)
MCHC: 33 g/dL (ref 31.5–35.7)
MCV: 95 fL (ref 79–97)
Platelets: 236 10*3/uL (ref 150–450)
RBC: 4.7 x10E6/uL (ref 3.77–5.28)
RDW: 12.5 % (ref 11.7–15.4)
WBC: 6 10*3/uL (ref 3.4–10.8)

## 2023-10-29 LAB — VITAMIN D 25 HYDROXY (VIT D DEFICIENCY, FRACTURES): Vit D, 25-Hydroxy: 48.2 ng/mL (ref 30.0–100.0)

## 2023-10-29 LAB — IRON: Iron: 100 ug/dL (ref 27–139)

## 2023-10-30 ENCOUNTER — Encounter: Payer: Self-pay | Admitting: Neurology

## 2023-10-30 ENCOUNTER — Ambulatory Visit (INDEPENDENT_AMBULATORY_CARE_PROVIDER_SITE_OTHER): Admitting: Neurology

## 2023-10-30 ENCOUNTER — Ambulatory Visit: Payer: Self-pay | Admitting: Nurse Practitioner

## 2023-10-30 VITALS — BP 131/78 | HR 98 | Ht 63.0 in | Wt 189.0 lb

## 2023-10-30 DIAGNOSIS — Z8669 Personal history of other diseases of the nervous system and sense organs: Secondary | ICD-10-CM | POA: Diagnosis not present

## 2023-10-30 DIAGNOSIS — G40909 Epilepsy, unspecified, not intractable, without status epilepticus: Secondary | ICD-10-CM | POA: Diagnosis not present

## 2023-10-30 DIAGNOSIS — Z5181 Encounter for therapeutic drug level monitoring: Secondary | ICD-10-CM

## 2023-10-30 MED ORDER — LEVETIRACETAM 500 MG PO TABS: 500.0000 mg | ORAL_TABLET | Freq: Two times a day (BID) | ORAL | 3 refills | Status: AC

## 2023-10-30 NOTE — Progress Notes (Signed)
 [facility]  PATIENT: Eileen Morgan DOB: January 18, 1951  REQUESTING CLINICIAN: Oley Bascom RAMAN, NP HISTORY FROM: Patient  REASON FOR VISIT: Establish care for epilepsy    HISTORICAL  CHIEF COMPLAINT:  Chief Complaint  Patient presents with   New Patient (Initial Visit)    Rm13, alone,  internal referral for hx of seizures: last one November 2020, well controlled    HISTORY OF PRESENT ILLNESS:  This is 73 year old woman past medical history of seizure disorder, COPD, GERD, hypertension who is presenting for management of her seizure.  Patient tells me her seizure started around 2019.  Her first seizure was nocturnal, she woke up with bruises all over her and tongue biting.  She had her second seizure in 2020 that was witnessed by her son who describes generalized convulsion.  Patient was taken to the hospital, her initial workup including EEG was normal.  She was discharged on Keppra  500 mg twice daily since being on Keppra , she has not had any additional seizures.  They are periods where she ran out of medication for about 3 days but fortunately no seizures. She tells me around that time 2018, 2019 she was trying to quit smoking and was put on Wellbutrin  and has been on that medication since then.  She is currently taking Wellbutrin  200 mg twice daily but was prescribed for smoking cessation.   Handedness: Right   Onset: 2019  Seizure Type: Generalized convulsion   Current frequency: Last one was in 2020  Any injuries from seizures: Tongue biting, bruises   Seizure risk factors: Denies   Previous ASMs: Levetiracetam    Currenty ASMs: Levetiracetam  500 mg twice daily   ASMs side effects: None   Brain Images: No acute findings   Previous EEGs: Normal    OTHER MEDICAL CONDITIONS: Seizure disorder, Hypertension, COPD, GERD  REVIEW OF SYSTEMS: Full 14 system review of systems performed and negative with exception of: As noted in the HPI    ALLERGIES: Allergies  Allergen Reactions   Penicillins Anaphylaxis   Yellow Jacket Venom     HOME MEDICATIONS: Outpatient Medications Prior to Visit  Medication Sig Dispense Refill   buPROPion  (WELLBUTRIN  SR) 200 MG 12 hr tablet TAKE 1 TABLET BY MOUTH TWICE A DAY 180 tablet 1   cyanocobalamin  (VITAMIN B12) 500 MCG tablet Take 1 tablet (500 mcg total) by mouth daily.     folic acid  (FOLVITE ) 800 MCG tablet Take 0.5 tablets (400 mcg total) by mouth daily. 30 tablet 2   hydrochlorothiazide  (HYDRODIURIL ) 25 MG tablet TAKE 1 TABLET (25 MG TOTAL) BY MOUTH DAILY. 90 tablet 1   loratadine  (CLARITIN ) 10 MG tablet Take 10 mg by mouth as needed for allergies.     meloxicam  (MOBIC ) 15 MG tablet TAKE 1 TABLET (15 MG TOTAL) BY MOUTH DAILY. 30 tablet 3   omeprazole  (PRILOSEC) 20 MG capsule TAKE 1 CAPSULE BY MOUTH EVERY DAY 90 capsule 1   oxybutynin  (DITROPAN -XL) 10 MG 24 hr tablet TAKE 1 TABLET BY MOUTH EVERYDAY AT BEDTIME 90 tablet 0   OXYGEN  Inhale 2 L into the lungs daily as needed (oxygen ).     Vitamin D , Ergocalciferol , (DRISDOL ) 1.25 MG (50000 UNIT) CAPS capsule Take 1 capsule (50,000 Units total) by mouth every 7 (seven) days. 4 capsule 0   levETIRAcetam  (KEPPRA ) 500 MG tablet TAKE 1 TABLET BY MOUTH TWICE A DAY 180 tablet 1   No facility-administered medications prior to visit.    PAST MEDICAL HISTORY: Past Medical History:  Diagnosis  Date   Back pain    COPD (chronic obstructive pulmonary disease) (HCC)    GERD (gastroesophageal reflux disease)    Pulmonary embolism (HCC)    Seizures (HCC)     PAST SURGICAL HISTORY: Past Surgical History:  Procedure Laterality Date   BIOPSY  01/12/2021   Procedure: BIOPSY;  Surgeon: Wilhelmenia, Aloha Raddle., MD;  Location: [facility] ENDOSCOPY;  Service: Gastroenterology;;  EGD and COLON   COLONOSCOPY WITH PROPOFOL  N/A 01/12/2021   Procedure: COLONOSCOPY WITH PROPOFOL ;  Surgeon: Wilhelmenia Aloha Raddle., MD;  Location: [facility] ENDOSCOPY;  Service:  Gastroenterology;  Laterality: N/A;   ESOPHAGOGASTRODUODENOSCOPY (EGD) WITH PROPOFOL  N/A 01/12/2021   Procedure: ESOPHAGOGASTRODUODENOSCOPY (EGD) WITH PROPOFOL ;  Surgeon: Wilhelmenia Aloha Raddle., MD;  Location: [facility] ENDOSCOPY;  Service: Gastroenterology;  Laterality: N/A;   POLYPECTOMY  01/12/2021   Procedure: POLYPECTOMY;  Surgeon: Mansouraty, Aloha Raddle., MD;  Location: [facility] ENDOSCOPY;  Service: Gastroenterology;;   TONSILLECTOMY     TUBAL LIGATION      FAMILY HISTORY: Family History  Problem Relation Age of Onset   Stroke Mother    Hyperlipidemia Mother    Hypertension Mother    Thyroid  disease Mother    COPD Father    Emphysema Brother    CAD Neg Hx    Diabetes Mellitus II Neg Hx     SOCIAL HISTORY: Social History   Socioeconomic History   Marital status: Single    Spouse name: Not on file   Number of children: Not on file   Years of education: Not on file   Highest education level: Not on file  Occupational History   Not on file  Tobacco Use   Smoking status: Former    Current packs/day: 0.00    Average packs/day: 1 pack/day for 40.0 years (40.0 ttl pk-yrs)    Types: Cigarettes    Start date: 03/08/1979    Quit date: 03/08/2019    Years since quitting: 4.6   Smokeless tobacco: Never  Vaping Use   Vaping status: Never Used  Substance and Sexual Activity   Alcohol use: Not Currently   Drug use: Never   Sexual activity: Not on file  Other Topics Concern   Not on file  Social History Narrative   Not on file   Social Drivers of Health   Financial Resource Strain: Low Risk  (05/17/2022)   Overall Financial Resource Strain (CARDIA)    Difficulty of Paying Living Expenses: Not hard at all  Food Insecurity: No Food Insecurity (10/28/2023)   Hunger Vital Sign    Worried About Running Out of Food in the Last Year: Never true    Ran Out of Food in the Last Year: Never true  Transportation Needs: No Transportation Needs (10/28/2023)   PRAPARE - Scientist, research (physical sciences) (Medical): No    Lack of Transportation (Non-Medical): No  Physical Activity: Insufficiently Active (05/17/2022)   Exercise Vital Sign    Days of Exercise per Week: 3 days    Minutes of Exercise per Session: 30 min  Stress: No Stress Concern Present (05/17/2022)   [employer] of Occupational Health - Occupational Stress Questionnaire    Feeling of Stress : Not at all  Social Connections: Socially Isolated (05/17/2022)   Social Connection and Isolation Panel    Frequency of Communication with Friends and Family: More than three times a week    Frequency of Social Gatherings with Friends and Family: More than three times a week    Attends Religious Services:  Never    Active Member of Clubs or Organizations: No    Attends Banker Meetings: Never    Marital Status: Divorced  Intimate Partner Violence: Not At Risk (05/17/2022)   Humiliation, Afraid, Rape, and Kick questionnaire    Fear of Current or Ex-Partner: No    Emotionally Abused: No    Physically Abused: No    Sexually Abused: No    PHYSICAL EXAM  GENERAL EXAM/CONSTITUTIONAL: Vitals:  Vitals:   10/30/23 0836  BP: 131/78  Pulse: 98  Weight: 189 lb (85.7 kg)  Height: 5' 3 (1.6 m)   Body mass index is 33.48 kg/m. Wt Readings from Last 3 Encounters:  10/30/23 189 lb (85.7 kg)  10/28/23 190 lb (86.2 kg)  10/15/23 186 lb (84.4 kg)   Patient is in no distress; well developed, nourished and groomed; neck is supple  MUSCULOSKELETAL: Gait, strength, tone, movements noted in Neurologic exam below  NEUROLOGIC: MENTAL STATUS:      No data to display         awake, alert, oriented to person, place and time recent and remote memory intact normal attention and concentration language fluent, comprehension intact, naming intact fund of knowledge appropriate  CRANIAL NERVE:  2nd, 3rd, 4th, 6th - Visual fields full to confrontation, extraocular muscles intact, no nystagmus 5th - facial  sensation symmetric 7th - facial strength symmetric 8th - hearing intact 9th - palate elevates symmetrically, uvula midline 11th - shoulder shrug symmetric 12th - tongue protrusion midline  MOTOR:  normal bulk and tone, full strength in the BUE, BLE  SENSORY:  normal and symmetric to light touch  COORDINATION:  finger-nose-finger, fine finger movements normal  GAIT/STATION:  normal   DIAGNOSTIC DATA (LABS, IMAGING, TESTING) - I reviewed patient records, labs, notes, testing and imaging myself where available.  Lab Results  Component Value Date   WBC 6.0 10/28/2023   HGB 14.8 10/28/2023   HCT 44.8 10/28/2023   MCV 95 10/28/2023   PLT 236 10/28/2023      Component Value Date/Time   NA 141 10/28/2023 1434   K 4.6 10/28/2023 1434   CL 102 10/28/2023 1434   CO2 26 10/28/2023 1434   GLUCOSE 87 10/28/2023 1434   GLUCOSE 169 (H) 03/21/2019 0356   BUN 19 10/28/2023 1434   CREATININE 1.00 10/28/2023 1434   CALCIUM 9.3 10/28/2023 1434   PROT 6.1 10/28/2023 1434   ALBUMIN 4.0 10/28/2023 1434   AST 20 10/28/2023 1434   ALT 21 10/28/2023 1434   ALKPHOS 82 10/28/2023 1434   BILITOT 0.4 10/28/2023 1434   GFRNONAA 87 06/16/2019 1150   GFRAA 100 06/16/2019 1150   Lab Results  Component Value Date   CHOL 197 06/06/2023   HDL 65 06/06/2023   LDLCALC 103 (H) 06/06/2023   TRIG 168 (H) 06/06/2023   Lab Results  Component Value Date   HGBA1C 5.8 (H) 06/06/2023   Lab Results  Component Value Date   VITAMINB12 364 06/06/2023   Lab Results  Component Value Date   TSH 3.010 06/06/2023    MRI Brain 2020 No acute finding. No cause seizure is identified. Chronic small-vessel change of the cerebral hemispheric white matter, fairly typical for age.  Routine EEG 2020: Normal   I personally reviewed brain Images  ASSESSMENT AND PLAN  73 y.o. year old female  with history of seizure disorder, hypertension, GERD, COPD who is presenting for evaluation and management of her  seizures.  She is doing well  on levetiracetam  500 mg twice daily, her last seizure was in 2020.  She is also on Wellbutrin  that was prescribed for smoking cessation.  Currently taking 200 mg twice daily but she patient has quit smoking.  I informed her that Wellbutrin  is known to lower the seizure threshold and she should follow-up with her PCP to try a different medication since now she is reporting some lack of motivation, may be trial of SSRI.  I will obtain a Keppra  level, with vitamin D  and also an updated EEG.  I will call patient to go over the results otherwise she will follow-up in 1 year or sooner if worse.   1. Seizure disorder (HCC)   2. Personal history of other diseases of the nervous system and sense organs   3. Therapeutic drug monitoring     Patient Instructions  Continue with Keppra  500 mg twice daily, refill given  Will check Keppra  level and Vitamin D   Discuss with PCP switching Wellbutrin  to a different SSRI. Wellbutrin  prescribed for smoking cessation. Patient no longer using cigarette, reports decrease motivation and Wellbutrin  is well know to lower seizure threshold  Routine EEG, I will contact you to go over the results  Return in a year or sooner if worse    Per [place]  DMV statutes, patients with seizures are not allowed to drive until they have been seizure-free for six months.  Other recommendations include using caution when using heavy equipment or power tools. Avoid working on ladders or at heights. Take showers instead of baths.  Do not swim alone.  Ensure the water temperature is not too high on the home water heater. Do not go swimming alone. Do not lock yourself in a room alone (i.e. bathroom). When caring for infants or small children, sit down when holding, feeding, or changing them to minimize risk of injury to the child in the event you have a seizure. Maintain good sleep hygiene. Avoid alcohol.  Also recommend adequate sleep, hydration, good diet and  minimize stress.   During the Seizure  - First, ensure adequate ventilation and place patients on the floor on their left side  Loosen clothing around the neck and ensure the airway is patent. If the patient is clenching the teeth, do not force the mouth open with any object as this can cause severe damage - Remove all items from the surrounding that can be hazardous. The patient may be oblivious to what's happening and may not even know what he or she is doing. If the patient is confused and wandering, either gently guide him/her away and block access to outside areas - Reassure the individual and be comforting - Call 911. In most cases, the seizure ends before EMS arrives. However, there are cases when seizures may last over 3 to 5 minutes. Or the individual may have developed breathing difficulties or severe injuries. If a pregnant patient or a person with diabetes develops a seizure, it is prudent to call an ambulance. - Finally, if the patient does not regain full consciousness, then call EMS. Most patients will remain confused for about 45 to 90 minutes after a seizure, so you must use judgment in calling for help. - Avoid restraints but make sure the patient is in a bed with padded side rails - Place the individual in a lateral position with the neck slightly flexed; this will help the saliva drain from the mouth and prevent the tongue from falling backward - Remove all nearby furniture and other  hazards from the area - Provide verbal assurance as the individual is regaining consciousness - Provide the patient with privacy if possible - Call for help and start treatment as ordered by the caregiver   After the Seizure (Postictal Stage)  After a seizure, most patients experience confusion, fatigue, muscle pain and/or a headache. Thus, one should permit the individual to sleep. For the next few days, reassurance is essential. Being calm and helping reorient the person is also of  importance.  Most seizures are painless and end spontaneously. Seizures are not harmful to others but can lead to complications such as stress on the lungs, brain and the heart. Individuals with prior lung problems may develop labored breathing and respiratory distress.    Discussed Patients with epilepsy have a small risk of sudden unexpected death, a condition referred to as sudden unexpected death in epilepsy (SUDEP). SUDEP is defined specifically as the sudden, unexpected, witnessed or unwitnessed, nontraumatic and nondrowning death in patients with epilepsy with or without evidence for a seizure, and excluding documented status epilepticus, in which post mortem examination does not reveal a structural or toxicologic cause for death     Orders Placed This Encounter  Procedures   Levetiracetam  level   Vitamin D , 25-hydroxy   EEG adult    Meds ordered this encounter  Medications   levETIRAcetam  (KEPPRA ) 500 MG tablet    Sig: Take 1 tablet (500 mg total) by mouth 2 (two) times daily.    Dispense:  180 tablet    Refill:  3    Return in about 1 year (around 10/29/2024).    Pastor Falling, MD 10/30/2023, 12:55 PM  [facility] [address], [address] [place], [place] [address] [phone]

## 2023-10-30 NOTE — Patient Instructions (Signed)
 Continue with Keppra  500 mg twice daily, refill given  Will check Keppra  level and Vitamin D   Discuss with PCP switching Wellbutrin  to a different SSRI. Wellbutrin  prescribed for smoking cessation. Patient no longer using cigarette, reports decrease motivation and Wellbutrin  is well know to lower seizure threshold  Routine EEG, I will contact you to go over the results  Return in a year or sooner if worse

## 2023-11-01 ENCOUNTER — Ambulatory Visit: Payer: Self-pay | Admitting: Neurology

## 2023-11-01 LAB — VITAMIN D 25 HYDROXY (VIT D DEFICIENCY, FRACTURES): Vit D, 25-Hydroxy: 54.5 ng/mL (ref 30.0–100.0)

## 2023-11-01 LAB — LEVETIRACETAM LEVEL: Levetiracetam Lvl: 9.6 ug/mL — AB (ref 10.0–40.0)

## 2023-11-05 ENCOUNTER — Ambulatory Visit (INDEPENDENT_AMBULATORY_CARE_PROVIDER_SITE_OTHER): Admitting: Neurology

## 2023-11-05 ENCOUNTER — Ambulatory Visit (INDEPENDENT_AMBULATORY_CARE_PROVIDER_SITE_OTHER): Admitting: Family Medicine

## 2023-11-05 DIAGNOSIS — G40909 Epilepsy, unspecified, not intractable, without status epilepticus: Secondary | ICD-10-CM | POA: Diagnosis not present

## 2023-11-05 NOTE — Procedures (Signed)
   History:  73 year old woman with seizure   EEG classification:  Awake and asleep  Duration: 26 minutes   Technical aspects: This EEG study was done with scalp electrodes positioned according to the 10-20 International system of electrode placement. Electrical activity was reviewed with band pass filter of 1-70Hz , sensitivity of 7 uV/mm, display speed of 86mm/sec with a 60Hz  notched filter applied as appropriate. EEG data were recorded continuously and digitally stored.   Description of the recording: The background rhythms of this recording consists of a fairly well modulated medium amplitude background activity of 10 Hz. As the record progresses, the patient initially is in the waking state, but appears to enter the early stage II sleep during the recording, with rudimentary sleep spindles and vertex sharp wave activity seen. During the wakeful state, photic stimulation was performed, and no abnormal responses were seen. Hyperventilation was not performed. No epileptiform discharges seen during this recording. There was no focal slowing.   Abnormality: None   Impression: This is a normal awake and sleep EEG. No evidence of interictal epileptiform discharges. Normal EEGs, however, do not rule out epilepsy.    Jacquelinne Speak, MD [facility]

## 2023-11-06 ENCOUNTER — Encounter (INDEPENDENT_AMBULATORY_CARE_PROVIDER_SITE_OTHER): Payer: Self-pay | Admitting: Family Medicine

## 2023-11-06 ENCOUNTER — Ambulatory Visit (INDEPENDENT_AMBULATORY_CARE_PROVIDER_SITE_OTHER): Admitting: Family Medicine

## 2023-11-06 VITALS — BP 138/82 | HR 64 | Temp 98.7°F | Ht 62.0 in | Wt 184.0 lb

## 2023-11-06 DIAGNOSIS — R944 Abnormal results of kidney function studies: Secondary | ICD-10-CM

## 2023-11-06 DIAGNOSIS — M8580 Other specified disorders of bone density and structure, unspecified site: Secondary | ICD-10-CM | POA: Diagnosis not present

## 2023-11-06 DIAGNOSIS — I10 Essential (primary) hypertension: Secondary | ICD-10-CM | POA: Diagnosis not present

## 2023-11-06 DIAGNOSIS — E669 Obesity, unspecified: Secondary | ICD-10-CM | POA: Diagnosis not present

## 2023-11-06 DIAGNOSIS — E538 Deficiency of other specified B group vitamins: Secondary | ICD-10-CM | POA: Diagnosis not present

## 2023-11-06 DIAGNOSIS — E782 Mixed hyperlipidemia: Secondary | ICD-10-CM

## 2023-11-06 DIAGNOSIS — E559 Vitamin D deficiency, unspecified: Secondary | ICD-10-CM

## 2023-11-06 DIAGNOSIS — R7303 Prediabetes: Secondary | ICD-10-CM

## 2023-11-06 DIAGNOSIS — Z6833 Body mass index (BMI) 33.0-33.9, adult: Secondary | ICD-10-CM | POA: Diagnosis not present

## 2023-11-06 MED ORDER — VITAMIN D (ERGOCALCIFEROL) 1.25 MG (50000 UNIT) PO CAPS
50000.0000 [IU] | ORAL_CAPSULE | ORAL | 0 refills | Status: DC
Start: 2023-11-06 — End: 2023-12-09

## 2023-11-13 ENCOUNTER — Other Ambulatory Visit: Payer: Self-pay | Admitting: Nurse Practitioner

## 2023-11-13 DIAGNOSIS — F32A Depression, unspecified: Secondary | ICD-10-CM

## 2023-11-13 NOTE — Telephone Encounter (Signed)
 Please advise [facility]

## 2023-11-20 DIAGNOSIS — R42 Dizziness and giddiness: Secondary | ICD-10-CM | POA: Diagnosis not present

## 2023-11-20 DIAGNOSIS — R079 Chest pain, unspecified: Secondary | ICD-10-CM | POA: Diagnosis not present

## 2023-11-20 DIAGNOSIS — R110 Nausea: Secondary | ICD-10-CM | POA: Diagnosis not present

## 2023-11-23 DIAGNOSIS — R079 Chest pain, unspecified: Secondary | ICD-10-CM | POA: Diagnosis not present

## 2023-11-28 ENCOUNTER — Other Ambulatory Visit: Payer: Self-pay | Admitting: Nurse Practitioner

## 2023-11-28 DIAGNOSIS — I10 Essential (primary) hypertension: Secondary | ICD-10-CM

## 2023-11-30 NOTE — Progress Notes (Signed)
 Barnie DOROTHA Jenkins, D.O.  ABFM, ABOM Specializing in Clinical Bariatric Medicine  Office located at: [address] [place], [place]  [address]   Assessment and Plan:    Will obtained labs at next OV (folate, A1c, direct LDL, lipid panel, and vit B12).   Orders Placed This Encounter  Procedures   Vitamin B12   Lipid panel   LDL cholesterol, direct   Hemoglobin A1c   Folate   Medications Discontinued During This Encounter  Medication Reason   Vitamin D , Ergocalciferol , (DRISDOL ) 1.25 MG (50000 UNIT) CAPS capsule Reorder    Meds ordered this encounter  Medications   Vitamin D , Ergocalciferol , (DRISDOL ) 1.25 MG (50000 UNIT) CAPS capsule    Sig: Take 1 capsule (50,000 Units total) by mouth every 7 (seven) days.    Dispense:  4 capsule    Refill:  0    FOR THE DISEASE OF OBESITY: Obesity (BMI 30-39.9), Starting BMI 38.03 BMI 33.0-33.9,adult current 33.65 Assessment & Plan: Since last office visit on 10/15/23 patient's muscle mass has increased by 0.6 lbs. Fat mass has decreased by 2.4 lbs. Total body water has decreased by 0.8 lbs. Body fat percentage decreased by 0.9%. Counseling done on how various foods will affect these numbers and how to maximize success  Total lbs lost to date: 24  lbs Total weight loss percentage to date: -11.54 %   Recommended Dietary Goals Eileen Morgan is currently in the action stage of change. As such, her goal is to continue weight management plan.  She has agreed to: continue current plan   Behavioral Intervention We discussed the following today: increasing lean protein intake to established goals, decreasing simple carbohydrates , avoiding skipping meals, increasing water intake , keeping healthy foods at home, and decreasing eating out or consumption of processed foods, and making healthy choices when eating convenient foods  Additional resources provided today: None.   Evidence-based interventions for health behavior change were utilized  today including the discussion of self monitoring techniques, problem-solving barriers and SMART goal setting techniques.   Regarding patient's less desirable eating habits and patterns, we employed the technique of small changes.   Pt will specifically work on: n/a    Recommended Physical Activity Goals Eileen Morgan has been advised to work up to 300-450 minutes of moderate intensity aerobic activity a week and strengthening exercises 2-3 times per week for cardiovascular health, weight loss maintenance and preservation of muscle mass.   She has agreed to: Continue current level of physical activity  and continue to gradually increase the amount and intensity of exercise routine   Pharmacotherapy We both agreed to: Continue with current nutritional and behavioral strategies and continue medications that aid in wt loss (Wellbutrin  SR).   ASSOCIATED CONDITIONS ADDRESSED TODAY:  Hypertension, unspecified type Assessment & Plan: BP Readings from Last 3 Encounters:  11/06/23 138/82  10/30/23 131/78  10/28/23 (!) 117/54   BP is only slightly above goal today. Currently taking HCTZ 25 mg once daily. Good compliance and tolerance. Not currently on any ARBs.   BP goal of <130/80 independently. Encouraged at home BP monitoring 2x/wkly or if feeling poorly; reviewed hypotensive/hypertensive sx. Discuss with PCP about potentially adding an ARB for BP control and renal protection.     Decreased calculated GFR Assessment & Plan: Pt has slightly decreased GFR; creatinine ranged from 0.81 (5 mo ago) to 0.96 (4 mo ago). Has not been hydrating well. Not currently on an ARB.   Upper limit of normal GFR range  is 1. Encouraged drinking 1/2 her body wt in ounces/day. Avoid NSAIDs such as Advil or Aleve. Stressed importance of BP and blood glucose control, wt loss, and regular exercise to support renal function. I recommend she talk to her PCP about possibly adding an ARB to her HCTZ for renal protection. Will  continue monitoring alongside PCP.     Vitamin D  deficiency- (with osteopenia) Assessment & Plan: Lab Results  Component Value Date   VD25OH 54.5 10/30/2023   VD25OH 48.2 10/28/2023   VD25OH 25.3 (L) 06/06/2023   Reviewed recent vit D labs obtained: 48.2 on 10/28/23 by PCP and 54.5 on 10/30/23 by neurology. Compliant with ERGO 50K units. Tolerating well with no SE.   Continue supplementation at current dose; refill ERGO today. Will continue monitoring and recheck levels periodically; 3-4 months from last checked.     Prediabetes Assessment & Plan: Lab Results  Component Value Date   HGBA1C 5.8 (H) 06/06/2023   HGBA1C 5.7 (A) 01/01/2023   HGBA1C 5.8 (A) 03/27/2022   INSULIN  6.7 06/06/2023    No current meds. Diet/lifestyle approach. Hunger/cravings are well controlled. A1c not checked in 4+ mo.   Avoid skipping meal. Continue adherence to meal plan and increase exercise. Stressed importance of increased protein intake to help stabilize blood sugars. Will recheck A1c and insulin  at next OV.     Mixed hyperlipidemia- new onset Assessment & Plan: Lab Results  Component Value Date   CHOL 197 06/06/2023   HDL 65 06/06/2023   LDLCALC 103 (H) 06/06/2023   TRIG 168 (H) 06/06/2023   CHOLHDL 3.0 06/06/2023   Reviewed lipid panel obtained 06/06/23: LDL and TGs were above goal at 103 and 168 respectively. Not on any meds. Diet/lifestyle approach.   Follow heart healthy diet per meal plan. Reduce saturated/trans fats, simple carbs.sugars, and fatty meats. Continue monitoring; will recheck fasting lipid panel at next OV.     B12 deficiency due to diet- new onset Assessment & Plan: Lab Results  Component Value Date   VITAMINB12 364 06/06/2023   Last obtained B12 was below goal tat 364 on 06/06/2023. Currently taking OTC B12 500 mcg once daily. Tolerating well, non adverse SE.   Continue supplementation. Ideal B12 goal of 500 or greater with pt. Will recheck B12 at next OV.      Folate deficiency Assessment & Plan: Recently started folates supplementation; taking 800 mcg daily for the past 4 days. Currently managed by PCP, who recommended 400 mcg daily.   Advised pt to take 800 mcg every other day or 400 mcg daily if splitting pill is too difficult. Continue monitoring for any sx or SE. Follow up with PCP as instructed her them.    Follow up:   Return for keep upcoming appt- come fasting, make another 3-4 wks later, with Dr Midge.  She was informed of the importance of frequent follow up visits to maximize her success with intensive lifestyle modifications for her multiple health conditions.  Subjective:   Chief complaint: Obesity Eileen Morgan is here to discuss her progress with her obesity treatment plan. She is on the Category 3 Plan, keeping a food journal and adhering to recommended goals of 1600 calories and 105+ g of protein, and B/L options and 100 snack calories  and states she is following her eating plan approximately 90% of the time. She states she is walking 30 minutes 4 days per week.  Interval History:  Eileen Morgan is here for a follow up office visit. Since last  OV with Shawn Rayburn, PA on 10/15/23, she is down 2 lbs. Went to a family wedding recently and although she had some off-plan foods, she also ate shredded chicken. Has been staying active with [employer]. She has been more mindful about her condiments on her sandwiches, specifically mayonnaise.   Pharmacotherapy that aid with weight loss: She is currently taking Wellbutrin  SR 200 mg BID   Review of Systems:  Pertinent positives were addressed with patient today.  Reviewed by clinician on day of visit: allergies, medications, problem list, medical history, surgical history, family history, social history, and previous encounter notes.  Weight Summary and Biometrics   *See synopsis for biometric data.*  Objective:   PHYSICAL EXAM: Blood pressure 138/82, pulse 64, temperature 98.7 F (37.1  C), height 5' 2 (1.575 m), weight 184 lb (83.5 kg), SpO2 99%. Body mass index is 33.65 kg/m.  General: she is overweight, cooperative and in no acute distress. PSYCH: Has normal mood, affect and thought process.   HEENT: EOMI, sclerae are anicteric. Lungs: Normal breathing effort, no conversational dyspnea. Extremities: Moves * 4 Neurologic: A and O * 3, good insight  DIAGNOSTIC DATA REVIEWED: BMET    Component Value Date/Time   NA 141 10/28/2023 1434   K 4.6 10/28/2023 1434   CL 102 10/28/2023 1434   CO2 26 10/28/2023 1434   GLUCOSE 87 10/28/2023 1434   GLUCOSE 169 (H) 03/21/2019 0356   BUN 19 10/28/2023 1434   CREATININE 1.00 10/28/2023 1434   CALCIUM 9.3 10/28/2023 1434   GFRNONAA 87 06/16/2019 1150   GFRAA 100 06/16/2019 1150   Lab Results  Component Value Date   HGBA1C 5.8 (H) 06/06/2023   HGBA1C 5.5 12/22/2020   Lab Results  Component Value Date   INSULIN  6.7 06/06/2023   Lab Results  Component Value Date   TSH 3.010 06/06/2023   CBC    Component Value Date/Time   WBC 6.0 10/28/2023 1434   WBC 5.9 03/21/2019 0356   RBC 4.70 10/28/2023 1434   RBC 4.49 03/21/2019 0356   HGB 14.8 10/28/2023 1434   HCT 44.8 10/28/2023 1434   PLT 236 10/28/2023 1434   MCV 95 10/28/2023 1434   MCH 31.5 10/28/2023 1434   MCH 29.6 03/21/2019 0356   MCHC 33.0 10/28/2023 1434   MCHC 32.0 03/21/2019 0356   RDW 12.5 10/28/2023 1434   Iron Studies    Component Value Date/Time   IRON 100 10/28/2023 1434   TIBC 386 07/03/2023 1006   FERRITIN 155 (H) 07/03/2023 1006   IRONPCTSAT 27 07/03/2023 1006   Lipid Panel     Component Value Date/Time   CHOL 197 06/06/2023 1440   TRIG 168 (H) 06/06/2023 1440   HDL 65 06/06/2023 1440   CHOLHDL 3.0 06/06/2023 1440   LDLCALC 103 (H) 06/06/2023 1440   Hepatic Function Panel     Component Value Date/Time   PROT 6.1 10/28/2023 1434   ALBUMIN 4.0 10/28/2023 1434   AST 20 10/28/2023 1434   ALT 21 10/28/2023 1434   ALKPHOS 82  10/28/2023 1434   BILITOT 0.4 10/28/2023 1434   BILIDIR 0.12 07/03/2022 1041      Component Value Date/Time   TSH 3.010 06/06/2023 1440   Nutritional Lab Results  Component Value Date   VD25OH 54.5 10/30/2023   VD25OH 48.2 10/28/2023   VD25OH 25.3 (L) 06/06/2023    Attestations:   LILLETTE Vernell Forest, acting as a medical scribe for Barnie Jenkins, DO., have compiled all relevant documentation  for today's office visit on behalf of Barnie Jenkins, DO, while in the presence of Barnie Jenkins, DO.  I have reviewed the above documentation for accuracy and completeness, and I agree with the above. Barnie JINNY Jenkins, D.O.  The 21st Century Cures Act was signed into law in 2016 which includes the topic of electronic health records.  This provides immediate access to information in MyChart.  This includes consultation notes, operative notes, office notes, lab results and pathology reports.  If you have any questions about what you read please let us  know at your next visit so we can discuss your concerns and take corrective action if need be.  We are right here with you.

## 2023-12-01 NOTE — Addendum Note (Signed)
 Addended by: Marli Diego J on: 12/01/2023 11:43 AM   Modules accepted: Level of Service

## 2023-12-03 NOTE — Progress Notes (Deleted)
   SUBJECTIVE: Discussed the use of AI scribe software for clinical note transcription with the patient, who gave verbal consent to proceed.  Chief Complaint: Obesity  Interim History: ***  Eileen Morgan is here to discuss her progress with her obesity treatment plan. She is on the {HWW Weight Loss Plan:210964005} and states she {CHL AMB IS/IS NOT:210130109} following her eating plan approximately *** % of the time. She states she {CHL AMB IS/IS NOT:210130109} exercising *** minutes *** times per week.   OBJECTIVE: Visit Diagnoses: Problem List Items Addressed This Visit     Hypertension - Primary   Vitamin D  deficiency- with osteopenia   Prediabetes- new onset   Mixed hyperlipidemia- new onset   Other Visit Diagnoses       Obesity (BMI 30-39.9), Starting BMI 38.03           No data recorded No data recorded No data recorded No data recorded   ASSESSMENT AND PLAN:  Diet: Eileen Morgan {CHL AMB IS/IS NOT:210130109} currently in the action stage of change. As such, her goal is to {HWW Weight Loss Efforts:210964006}. She {HAS HAS WNU:81165} agreed to {HWW Weight Loss Plan:210964005}.  Exercise: Eileen Morgan has been instructed {HWW Exercise:210964007} for weight loss and overall health benefits.   Behavior Modification:  We discussed the following Behavioral Modification Strategies today: {HWW Behavior Modification:210964008}. We discussed various medication options to help Eileen Morgan with her weight loss efforts and we both agreed to ***.  No follow-ups on file.SABRA She was informed of the importance of frequent follow up visits to maximize her success with intensive lifestyle modifications for her multiple health conditions.  Attestation Statements:   Reviewed by clinician on day of visit: allergies, medications, problem list, medical history, surgical history, family history, social history, and previous encounter notes.   Time spent on visit including pre-visit chart review and post-visit care  and charting was *** minutes.    Jessie Schrieber, PA-C

## 2023-12-04 ENCOUNTER — Ambulatory Visit (INDEPENDENT_AMBULATORY_CARE_PROVIDER_SITE_OTHER): Admitting: Physician Assistant

## 2023-12-09 ENCOUNTER — Encounter (INDEPENDENT_AMBULATORY_CARE_PROVIDER_SITE_OTHER): Payer: Self-pay | Admitting: Family Medicine

## 2023-12-09 ENCOUNTER — Ambulatory Visit (INDEPENDENT_AMBULATORY_CARE_PROVIDER_SITE_OTHER): Admitting: Family Medicine

## 2023-12-09 VITALS — BP 129/80 | HR 56 | Temp 97.8°F | Ht 62.0 in | Wt 182.0 lb

## 2023-12-09 DIAGNOSIS — I10 Essential (primary) hypertension: Secondary | ICD-10-CM

## 2023-12-09 DIAGNOSIS — E782 Mixed hyperlipidemia: Secondary | ICD-10-CM | POA: Diagnosis not present

## 2023-12-09 DIAGNOSIS — E538 Deficiency of other specified B group vitamins: Secondary | ICD-10-CM

## 2023-12-09 DIAGNOSIS — R7303 Prediabetes: Secondary | ICD-10-CM | POA: Diagnosis not present

## 2023-12-09 DIAGNOSIS — E669 Obesity, unspecified: Secondary | ICD-10-CM

## 2023-12-09 DIAGNOSIS — M8580 Other specified disorders of bone density and structure, unspecified site: Secondary | ICD-10-CM | POA: Diagnosis not present

## 2023-12-09 DIAGNOSIS — Z6833 Body mass index (BMI) 33.0-33.9, adult: Secondary | ICD-10-CM

## 2023-12-09 DIAGNOSIS — E559 Vitamin D deficiency, unspecified: Secondary | ICD-10-CM | POA: Diagnosis not present

## 2023-12-09 MED ORDER — VITAMIN D (ERGOCALCIFEROL) 1.25 MG (50000 UNIT) PO CAPS: 50000.0000 [IU] | ORAL_CAPSULE | ORAL | 0 refills | Status: DC

## 2023-12-09 NOTE — Progress Notes (Signed)
 Eileen Morgan, D.O.  ABFM, ABOM Specializing in Clinical Bariatric Medicine  Office located at: [address] [place], [place]  [address]   Assessment and Plan:   Medications Discontinued During This Encounter  Medication Reason   Vitamin D , Ergocalciferol , (DRISDOL ) 1.25 MG (50000 UNIT) CAPS capsule Reorder     Meds ordered this encounter  Medications   Vitamin D , Ergocalciferol , (DRISDOL ) 1.25 MG (50000 UNIT) CAPS capsule    Sig: Take 1 capsule (50,000 Units total) by mouth every 7 (seven) days.    Dispense:  4 capsule    Refill:  0     FOR THE DISEASE OF OBESITY:  BMI 33.0-33.9,adult current 33.29 Obesity (BMI 30-39.9), Starting BMI 38.03 Assessment & Plan: Since last office visit on 11/06/2023 patient's muscle mass has decreased by 0.6 lbs. Fat mass has decreased by 1 lbs. Total body water has increased by 0.2 lbs.  Body fat % has decreased by 0.2 %. Counseling done on how various foods will affect these numbers and how to maximize success  Total lbs lost to date: -26 lbs Total weight loss percentage to date: -12.50 %   Recommended Dietary Goals Clio is currently in the action stage of change. As such, her goal is to continue weight management plan.  She has agreed to: continue current plan   Behavioral Intervention We discussed the following today: increasing lean protein intake to established goals, work on tracking and journaling calories using tracking application, and continue to work on implementation of reduced calorie nutritional plan  Additional resources provided today: Handout on CAT 3 meal plan  Evidence-based interventions for health behavior change were utilized today including the discussion of self monitoring techniques, problem-solving barriers and SMART goal setting techniques.   Regarding patient's less desirable eating habits and patterns, we employed the technique of small changes.   Pt will specifically work on: n/a   Recommended  Physical Activity Goals Eileen Morgan has been advised to work up to 300-450 minutes of moderate intensity aerobic activity a week and strengthening exercises 2-3 times per week for cardiovascular health, weight loss maintenance and preservation of muscle mass.   She has agreed to: continue to gradually increase the amount and intensity of exercise routine   Pharmacotherapy Continue Wellbutrin  therapy.    ASSOCIATED CONDITIONS ADDRESSED TODAY:  Prediabetes Assessment & Plan: Lab Results  Component Value Date   HGBA1C 5.8 (H) 06/06/2023   HGBA1C 5.7 (A) 01/01/2023   HGBA1C 5.8 (A) 03/27/2022   INSULIN  6.7 06/06/2023    Managed with diet and life style interventions. Hunger and cravings are controlled when following her prudent nutritional plan.   - Continue working on nutrition plan to decrease simple carbohydrates, increase lean proteins and exercise to promote weight loss and improve glycemic control and prevent progression to T2DM. - Recheck Hemoglobin A1c today.     Hypertension, unspecified type Assessment & Plan: Last 3 blood pressure readings in our office are as follows: BP Readings from Last 3 Encounters:  12/09/23 129/80  11/06/23 138/82  10/30/23 131/78   The 10-year ASCVD risk score (Arnett DK, et al., 2019) is: 19.2%  Lab Results  Component Value Date   CREATININE 1.00 10/28/2023   Blood pressure is controlled today on hydrochlorothiazide  25 mg daily. Of note, she mentions that her BP was in the 160s during a recent beach trip. Pt feels her elevated BP was associated with spending quite a bit of time outdoors in the heat. She was seen in the  urgent care on 11/20/2023 when BP was high and felt something funny in her chest.  Since then  or chest pain.     Encouraged blood presusre montiering at home; explore AHA website to learn more about checking BP.    - Continue with low sodium diet.  - Advance exercise as able.  0 check BP at home   -   160s systoically  while travleing sinc eLOV  - thinks itswas from being in the heat.  Had an episode and went to ER.  Did not f/up with PCP.and waas encouraged to.    BP at goal today.       B12 deficiency due to diet Assessment & Plan: Lab Results  Component Value Date   VITAMINB12 364 06/06/2023   On OTC B12 500 mcg once daily with reported good compliance and tolerance. No acute concerns.   - Continue current regimen.  - continue nutrient rich diet - recheck levels today.     Vitamin D  deficiency- (with osteopenia) Assessment & Plan: Lab Results  Component Value Date   VD25OH 54.5 10/30/2023   VD25OH 48.2 10/28/2023   VD25OH 25.3 (L) 06/06/2023   Currently on Ergocalciferol  50,000 units weekly without any adverse effects such as nausea, vomiting or muscle weakness. No acute concerns. Most recent Vit D was at goal.   - Continue Vit D supplementation.  - Recheck: 3 months.    Folate deficiency Assessment & Plan: On Folate 400 mcg daily with good compliance and tolerance. Continue regimen. Recheck levels today.     Follow up:   No follow-ups on file.  She was informed of the importance of frequent follow up visits to maximize her success with intensive lifestyle modifications for her multiple health conditions.  [name] is aware that we will review all of her lab results at our next visit together in person.  She is aware that if anything is critical/ life threatening with the results, we will be contacting her via MyChart or by my CMA will be calling them prior to the office visit to discuss acute management.      Subjective:    Chief complaint: Obesity Eileen Morgan is here to discuss her progress with her obesity treatment plan. She is on the Category 3 Plan, keeping a food journal and adhering to recommended goals of 1600 calories and 105+ g of protein, and B/L options and 100 snack calories and states she is following her eating plan approximately 70-80% of the time. She  states she is doing yoga or stretching 30 minutes 7 days per week.   Interval History:  Eileen Morgan is here for a follow up office visit. Since last OV on 11/06/2023 , she is down 2 lbs. She has been traveling a lot but does not feel she over-indulged with foods. States she is getting in her lean proteins the majority of the time. Hunger and cravings are controlled when following her prudent nutritional plan.    Pharmacotherapy that aid with weight loss: She is currently taking Wellbutrin  SR 200 mg twice daily.   Review of Systems:  Pertinent positives were addressed with patient today.  Reviewed by clinician on day of visit: allergies, medications, problem list, medical history, surgical history, family history, social history, and previous encounter notes.  Weight Summary and Biometrics   Weight Lost Since Last Visit: 2lb  Weight Gained Since Last Visit: 0   Vitals Temp: 97.8 F (36.6 C) BP: 129/80 Pulse Rate: (!) 56 SpO2: 98 %  Anthropometric Measurements Height: 5' 2 (1.575 m) Weight: 182 lb (82.6 kg) BMI (Calculated): 33.28 Weight at Last Visit: 184lb Weight Lost Since Last Visit: 2lb Weight Gained Since Last Visit: 0 Starting Weight: 208lb Total Weight Loss (lbs): 26 lb (11.8 kg) Peak Weight: 211lb   Body Composition  Body Fat %: 41.1 % Fat Mass (lbs): 75 lbs Muscle Mass (lbs): 102.2 lbs Total Body Water (lbs): 67.6 lbs Visceral Fat Rating : 13   Other Clinical Data Fasting: yes Labs: no Today's Visit #: 9 Starting Date: 06/06/23    Objective:   PHYSICAL EXAM: Blood pressure 129/80, pulse (!) 56, temperature 97.8 F (36.6 C), height 5' 2 (1.575 m), weight 182 lb (82.6 kg), SpO2 98%. Body mass index is 33.29 kg/m.  General: she is overweight, cooperative and in no acute distress. PSYCH: Has normal mood, affect and thought process.   HEENT: EOMI, sclerae are anicteric. Lungs: Normal breathing effort, no conversational dyspnea. Extremities:  Moves * 4 Neurologic: A and O * 3, good insight  DIAGNOSTIC DATA REVIEWED: BMET    Component Value Date/Time   NA 141 10/28/2023 1434   K 4.6 10/28/2023 1434   CL 102 10/28/2023 1434   CO2 26 10/28/2023 1434   GLUCOSE 87 10/28/2023 1434   GLUCOSE 169 (H) 03/21/2019 0356   BUN 19 10/28/2023 1434   CREATININE 1.00 10/28/2023 1434   CALCIUM 9.3 10/28/2023 1434   GFRNONAA 87 06/16/2019 1150   GFRAA 100 06/16/2019 1150   Lab Results  Component Value Date   HGBA1C 5.8 (H) 06/06/2023   HGBA1C 5.5 12/22/2020   Lab Results  Component Value Date   INSULIN  6.7 06/06/2023   Lab Results  Component Value Date   TSH 3.010 06/06/2023   CBC    Component Value Date/Time   WBC 6.0 10/28/2023 1434   WBC 5.9 03/21/2019 0356   RBC 4.70 10/28/2023 1434   RBC 4.49 03/21/2019 0356   HGB 14.8 10/28/2023 1434   HCT 44.8 10/28/2023 1434   PLT 236 10/28/2023 1434   MCV 95 10/28/2023 1434   MCH 31.5 10/28/2023 1434   MCH 29.6 03/21/2019 0356   MCHC 33.0 10/28/2023 1434   MCHC 32.0 03/21/2019 0356   RDW 12.5 10/28/2023 1434   Iron Studies    Component Value Date/Time   IRON 100 10/28/2023 1434   TIBC 386 07/03/2023 1006   FERRITIN 155 (H) 07/03/2023 1006   IRONPCTSAT 27 07/03/2023 1006   Lipid Panel     Component Value Date/Time   CHOL 197 06/06/2023 1440   TRIG 168 (H) 06/06/2023 1440   HDL 65 06/06/2023 1440   CHOLHDL 3.0 06/06/2023 1440   LDLCALC 103 (H) 06/06/2023 1440   Hepatic Function Panel     Component Value Date/Time   PROT 6.1 10/28/2023 1434   ALBUMIN 4.0 10/28/2023 1434   AST 20 10/28/2023 1434   ALT 21 10/28/2023 1434   ALKPHOS 82 10/28/2023 1434   BILITOT 0.4 10/28/2023 1434   BILIDIR 0.12 07/03/2022 1041      Component Value Date/Time   TSH 3.010 06/06/2023 1440   Nutritional Lab Results  Component Value Date   VD25OH 54.5 10/30/2023   VD25OH 48.2 10/28/2023   VD25OH 25.3 (L) 06/06/2023    Attestations:   I, Special Puri, acting as a Cytogeneticist for Eileen Jenkins, DO., have compiled all relevant documentation for today's office visit on behalf of Eileen Jenkins, DO, while in the presence of [employer], DO.  I have spent *** minutes in the care of the patient today including *** minutes face-to-face assessing and reviewing listed medical problems above as outlined in office visit note and providing nutritional and behavioral counseling as outlined in obesity care plan.   I have reviewed the above documentation for accuracy and completeness, and I agree with the above. Eileen JINNY Morgan, D.O.  The 21st Century Cures Act was signed into law in 2016 which includes the topic of electronic health records.  This provides immediate access to information in MyChart.  This includes consultation notes, operative notes, office notes, lab results and pathology reports.  If you have any questions about what you read please let us  know at your next visit so we can discuss your concerns and take corrective action if need be.  We are right here with you.

## 2023-12-10 LAB — LIPID PANEL
Chol/HDL Ratio: 3 ratio (ref 0.0–4.4)
Cholesterol, Total: 192 mg/dL (ref 100–199)
HDL: 63 mg/dL (ref >39)
LDL Chol Calc (NIH): 105 mg/dL — ABNORMAL HIGH (ref 0–99)
Triglycerides: 139 mg/dL (ref 0–149)
VLDL Cholesterol Cal: 24 mg/dL (ref 5–40)

## 2023-12-10 LAB — FOLATE: Folate: 19.3 ng/mL (ref >3.0)

## 2023-12-10 LAB — HEMOGLOBIN A1C
Est. average glucose Bld gHb Est-mCnc: 108 mg/dL
Hgb A1c MFr Bld: 5.4 % (ref 4.8–5.6)

## 2023-12-10 LAB — LDL CHOLESTEROL, DIRECT: LDL Direct: 105 mg/dL — ABNORMAL HIGH (ref 0–99)

## 2023-12-10 LAB — VITAMIN B12: Vitamin B-12: 796 pg/mL (ref 232–1245)

## 2024-01-01 ENCOUNTER — Ambulatory Visit (INDEPENDENT_AMBULATORY_CARE_PROVIDER_SITE_OTHER): Admitting: Family Medicine

## 2024-01-15 ENCOUNTER — Other Ambulatory Visit: Payer: Self-pay | Admitting: Nurse Practitioner

## 2024-01-15 DIAGNOSIS — N3289 Other specified disorders of bladder: Secondary | ICD-10-CM

## 2024-01-15 DIAGNOSIS — N3281 Overactive bladder: Secondary | ICD-10-CM

## 2024-01-15 NOTE — Telephone Encounter (Unsigned)
 Copied from CRM #[id number]. Topic: Clinical - Medication Refill >> Jan 15, 2024  5:02 PM Delon T wrote: Medication: oxybutynin  (DITROPAN -XL) 10 MG 24 hr tablet  Has the patient contacted their pharmacy? Yes (Agent: If no, request that the patient contact the pharmacy for the refill. If patient does not wish to contact the pharmacy document the reason why and proceed with request.) (Agent: If yes, when and what did the pharmacy advise?)  This is the patient's preferred pharmacy:  [id number] - [place], [place] - [address] AT CORNER OF [facility] [address] Eileen Morgan [address] Phone: [phone] Fax: [phone]   Is this the correct pharmacy for this prescription? No If no, delete pharmacy and type the correct one.   Has the prescription been filled recently? Yes  Is the patient out of the medication? Yes  Has the patient been seen for an appointment in the last year OR does the patient have an upcoming appointment? Yes  Can we respond through MyChart? Yes  Agent: Please be advised that Rx refills may take up to 3 business days. We ask that you follow-up with your pharmacy.

## 2024-01-16 MED ORDER — OXYBUTYNIN CHLORIDE ER 10 MG PO TB24: 10.0000 mg | ORAL_TABLET | Freq: Every day | ORAL | 0 refills | Status: DC

## 2024-01-23 ENCOUNTER — Encounter (INDEPENDENT_AMBULATORY_CARE_PROVIDER_SITE_OTHER): Payer: Self-pay | Admitting: Family Medicine

## 2024-01-23 ENCOUNTER — Ambulatory Visit (INDEPENDENT_AMBULATORY_CARE_PROVIDER_SITE_OTHER): Admitting: Family Medicine

## 2024-01-23 VITALS — BP 138/75 | HR 51 | Temp 97.9°F | Ht 62.0 in | Wt 188.0 lb

## 2024-01-23 DIAGNOSIS — E669 Obesity, unspecified: Secondary | ICD-10-CM | POA: Diagnosis not present

## 2024-01-23 DIAGNOSIS — E782 Mixed hyperlipidemia: Secondary | ICD-10-CM

## 2024-01-23 DIAGNOSIS — R7303 Prediabetes: Secondary | ICD-10-CM

## 2024-01-23 DIAGNOSIS — M8580 Other specified disorders of bone density and structure, unspecified site: Secondary | ICD-10-CM

## 2024-01-23 DIAGNOSIS — E538 Deficiency of other specified B group vitamins: Secondary | ICD-10-CM

## 2024-01-23 DIAGNOSIS — Z6834 Body mass index (BMI) 34.0-34.9, adult: Secondary | ICD-10-CM

## 2024-01-23 DIAGNOSIS — E559 Vitamin D deficiency, unspecified: Secondary | ICD-10-CM

## 2024-01-23 MED ORDER — VITAMIN D (ERGOCALCIFEROL) 1.25 MG (50000 UNIT) PO CAPS
50000.0000 [IU] | ORAL_CAPSULE | ORAL | 0 refills | Status: DC
Start: 2024-01-23 — End: 2024-02-05

## 2024-01-23 NOTE — Patient Instructions (Signed)
 The 10-year ASCVD risk score (Arnett DK, et al., 2019) is: 21.6%   Values used to calculate the score:     Age: 73 years     Clincally relevant sex: Female     Is Non-Hispanic African American: No     Diabetic: No     Tobacco smoker: No     Systolic Blood Pressure: 138 mmHg     Is BP treated: Yes     HDL Cholesterol: 63 mg/dL     Total Cholesterol: 192 mg/dL

## 2024-01-23 NOTE — Progress Notes (Signed)
 Eileen Morgan, D.O.  ABFM, ABOM Specializing in Clinical Bariatric Medicine  Office located at: [address] [place], [place]  [address]   Assessment and Plan:   Medications Discontinued During This Encounter  Medication Reason   Vitamin D , Ergocalciferol , (DRISDOL ) 1.25 MG (50000 UNIT) CAPS capsule Reorder     Meds ordered this encounter  Medications   Vitamin D , Ergocalciferol , (DRISDOL ) 1.25 MG (50000 UNIT) CAPS capsule    Sig: Take 1 capsule (50,000 Units total) by mouth every 7 (seven) days.    Dispense:  4 capsule    Refill:  0     FOR THE DISEASE OF OBESITY:  Obesity (BMI 30-39.9), Starting BMI 38.03 BMI 34.0-34.9,adult Current BMI 34.38 Assessment & Plan: Since last office visit on 12/09/23 patient's muscle mass has decreased by 0.2 lbs. Fat mass has increased by 6 lbs. Total body water has increased by 3.6 lbs.  Body fat % has increased by 1.9 %. Counseling done on how various foods will affect these numbers and how to maximize success  Total lbs lost to date: - 20 lbs Total weight loss percentage to date: - 9.62 %  - Tracking Calories/Macros: no   - Eating More Whole Foods: yes  - Adequate Protein Intake: yes  - Adequate Water Intake: yes  - Skipping Meals: no   - Sleeping 7-9 Hours/ Night: yes   Recommended Dietary Goals Finlay is currently in the action stage of change. As such, her goal is to continue weight management plan.  She has agreed un:dtpury to Cat 2 MP    Behavioral Intervention We discussed the following today: increasing lean protein intake to established goals and decreasing simple carbohydrates ,minimizing muscle loss  Additional resources provided today: None  Evidence-based interventions for health behavior change were utilized today including the discussion of self monitoring techniques, problem-solving barriers and SMART goal setting techniques.   Regarding patient's less desirable eating habits and patterns, we employed  the technique of small changes.   Goal(s) for next OV: n/a    Recommended Physical Activity Goals Ernie has been advised to work up to 300-450 minutes of moderate intensity aerobic activity a week and strengthening exercises 2-3 times per week for cardiovascular health, weight loss maintenance and preservation of muscle mass.   She has agreed to: Think about enjoyable ways to increase daily physical activity and overcoming barriers to exercise, Increase physical activity in their day and reduce sedentary time (increase NEAT)., and Increase volume of physical activity to a goal of 240 minutes a week   Pharmacotherapy We both agreed to: Continue with current nutritional and behavioral strategies   ASSOCIATED CONDITIONS ADDRESSED TODAY:  Prediabetes Assessment & Plan Lab Results  Component Value Date   HGBA1C 5.4 12/09/2023   HGBA1C 5.8 (H) 06/06/2023   HGBA1C 5.7 (A) 01/01/2023   INSULIN  6.7 06/06/2023    Controlled with diet and life style interventions. Pt states that her hunger and cravings are well controlled when purposeful with her protein intake. A1c has gotten better from 5.8 to 5.4.Will reassess at next OV and discuss metformin usage. Continue with meal plan and decreasing simple carbs and increasing lean protein intake.     Folate deficiency B12 deficiency due to diet Assessment & Plan Lab Results  Component Value Date   VITAMINB12 796 12/09/2023   FOLATE 19.3 12/09/2023   Reviewed labs. B12 went up to goal. Pt is taking OTC b12 500 mg once daily with good compliance and tolerance. Pt  also endorses taking folate supplementation. Continue taking and increasing B12 rich diet like fish and greens(kale spinach).     Mixed hyperlipidemia- new onset Assessment & Plan Lab Results  Component Value Date   CHOL 192 12/09/2023   HDL 63 12/09/2023   LDLCALC 105 (H) 12/09/2023   LDLDIRECT 105 (H) 12/09/2023   TRIG 139 12/09/2023   CHOLHDL 3.0 12/09/2023  The 10-year  ASCVD risk score (Arnett DK, et al., 2019) is: 21.6%   Values used to calculate the score:     Age: 42 years     Clincally relevant sex: Female     Is Non-Hispanic African American: No     Diabetic: No     Tobacco smoker: No     Systolic Blood Pressure: 138 mmHg     Is BP treated: Yes     HDL Cholesterol: 63 mg/dL     Total Cholesterol: 192 mg/dL  Reviewed labs today and answered any pertinent questions. Pt is not on any medications for high cholesterol. LDL has gotten worse could be due to increase in fatty meats and cheeses. Triglycerides have gone down from 173 to 139 due to eating less carbs. Pt has extensive history of smoking and quit in 2020. Recommended pt to f/up with PCP to talk about cholesterol medication due to risk being greater than 5%. Continue following meal plan to help lower risk. Decrease fatty meat intake as well as bread and butter. Increase exercise as tolerated.     Vitamin D  deficiency- (with osteopenia) Assessment & Plan Lab Results  Component Value Date   VD25OH 54.5 10/30/2023   VD25OH 48.2 10/28/2023   VD25OH 25.3 (L) 06/06/2023   On ERGO 50K units weekly. With good compliance and tolerance. Will refill today explained to pt that goal would be between 50-70. Explained how low vit D could also be linked to increase of cravings. Continue taking will recheck levels soon.     Follow up:   02/05/2024 at 2:20 PM  She was informed of the importance of frequent follow up visits to maximize her success with intensive lifestyle modifications for her multiple health conditions.    Subjective:    Chief complaint: Obesity Eileen Morgan is here to discuss her progress with her obesity treatment plan. She is on Category 3 Plan, keeping a food journal and adhering to recommended goals of 1600 calories and 105+ g of protein, and B/L options and 100 snack calories and states she is following her eating plan approximately 60 % of the time. Pt is walking 30 minutes 4 days per  week    Interval History:  Eileen Morgan is here for a follow up office visit. Pt has experienced a weight gain of 6 lbs since last OV on 12/09/23. Pt states that she was off plan for about 3 weeks due [place] and she had prepared a cooler with all her food but forgot it at home and had to eat whatever was available. She endorses not feeling bad or bloated up until a couple days ago. She mentions that she has walked a lot recently. She also endorses restarting drinking coke zero once again and that sometimes when she eats she continues eating because she doesn't feel satiated when not eating properly.     Pharmacotherapy that aid with weight loss: She is currently taking no anti-obesity medication.    Review of Systems:  Pertinent positives were addressed with patient today.  Reviewed by clinician on  day of visit: allergies, medications, problem list, medical history, surgical history, family history, social history, and previous encounter notes.  Weight Summary and Biometrics   Weight Lost Since Last Visit: 0lb  Weight Gained Since Last Visit: 6lb    Vitals Temp: 97.9 F (36.6 C) BP: 138/75 Pulse Rate: (!) 51 SpO2: 95 %   Anthropometric Measurements Height: 5' 2 (1.575 m) Weight: 188 lb (85.3 kg) BMI (Calculated): 34.38 Weight at Last Visit: 182lb Weight Lost Since Last Visit: 0lb Weight Gained Since Last Visit: 6lb Starting Weight: 208lb Total Weight Loss (lbs): 20 lb (9.072 kg) Peak Weight: 211lb   Body Composition  Body Fat %: 43 % Fat Mass (lbs): 81 lbs Muscle Mass (lbs): 102 lbs Total Body Water (lbs): 71.2 lbs Visceral Fat Rating : 14   Other Clinical Data Fasting: no Labs: no Today's Visit #: 10 Starting Date: 06/06/23    Objective:   PHYSICAL EXAM: Blood pressure 138/75, pulse (!) 51, temperature 97.9 F (36.6 C), height 5' 2 (1.575 m), weight 188 lb (85.3 kg), SpO2 95%. Body mass index is 34.39 kg/m.  General:  she is overweight, cooperative and in no acute distress. PSYCH: Has normal mood, affect and thought process.   HEENT: EOMI, sclerae are anicteric. Lungs: Normal breathing effort, no conversational dyspnea. Extremities: Moves * 4 Neurologic: A and O * 3, good insight  DIAGNOSTIC DATA REVIEWED: BMET    Component Value Date/Time   NA 141 10/28/2023 1434   K 4.6 10/28/2023 1434   CL 102 10/28/2023 1434   CO2 26 10/28/2023 1434   GLUCOSE 87 10/28/2023 1434   GLUCOSE 169 (H) 03/21/2019 0356   BUN 19 10/28/2023 1434   CREATININE 1.00 10/28/2023 1434   CALCIUM 9.3 10/28/2023 1434   GFRNONAA 87 06/16/2019 1150   GFRAA 100 06/16/2019 1150   Lab Results  Component Value Date   HGBA1C 5.4 12/09/2023   HGBA1C 5.5 12/22/2020   Lab Results  Component Value Date   INSULIN  6.7 06/06/2023   Lab Results  Component Value Date   TSH 3.010 06/06/2023   CBC    Component Value Date/Time   WBC 6.0 10/28/2023 1434   WBC 5.9 03/21/2019 0356   RBC 4.70 10/28/2023 1434   RBC 4.49 03/21/2019 0356   HGB 14.8 10/28/2023 1434   HCT 44.8 10/28/2023 1434   PLT 236 10/28/2023 1434   MCV 95 10/28/2023 1434   MCH 31.5 10/28/2023 1434   MCH 29.6 03/21/2019 0356   MCHC 33.0 10/28/2023 1434   MCHC 32.0 03/21/2019 0356   RDW 12.5 10/28/2023 1434   Iron Studies    Component Value Date/Time   IRON 100 10/28/2023 1434   TIBC 386 07/03/2023 1006   FERRITIN 155 (H) 07/03/2023 1006   IRONPCTSAT 27 07/03/2023 1006   Lipid Panel     Component Value Date/Time   CHOL 192 12/09/2023 1209   TRIG 139 12/09/2023 1209   HDL 63 12/09/2023 1209   CHOLHDL 3.0 12/09/2023 1209   LDLCALC 105 (H) 12/09/2023 1209   LDLDIRECT 105 (H) 12/09/2023 1209   Hepatic Function Panel     Component Value Date/Time   PROT 6.1 10/28/2023 1434   ALBUMIN 4.0 10/28/2023 1434   AST 20 10/28/2023 1434   ALT 21 10/28/2023 1434   ALKPHOS 82 10/28/2023 1434   BILITOT 0.4 10/28/2023 1434   BILIDIR 0.12 07/03/2022 1041       Component Value Date/Time   TSH 3.010 06/06/2023 1440   Nutritional  Lab Results  Component Value Date   VD25OH 54.5 10/30/2023   VD25OH 48.2 10/28/2023   VD25OH 25.3 (L) 06/06/2023    Attestations:   LILLETTE Sonny Laroche, acting as a medical scribe for Eileen Jenkins, DO., have compiled all relevant documentation for today's office visit on behalf of Eileen Jenkins, DO, while in the presence of [employer], DO.    I have reviewed the above documentation for accuracy and completeness, and I agree with the above. Eileen JINNY Morgan, D.O.  The 21st Century Cures Act was signed into law in 2016 which includes the topic of electronic health records.  This provides immediate access to information in MyChart.  This includes consultation notes, operative notes, office notes, lab results and pathology reports.  If you have any questions about what you read please let us  know at your next visit so we can discuss your concerns and take corrective action if need be.  We are right here with you.

## 2024-02-05 ENCOUNTER — Ambulatory Visit (INDEPENDENT_AMBULATORY_CARE_PROVIDER_SITE_OTHER): Admitting: Family Medicine

## 2024-02-05 ENCOUNTER — Encounter (INDEPENDENT_AMBULATORY_CARE_PROVIDER_SITE_OTHER): Payer: Self-pay | Admitting: Family Medicine

## 2024-02-05 VITALS — BP 132/77 | HR 54 | Temp 97.9°F | Ht 62.0 in | Wt 186.0 lb

## 2024-02-05 DIAGNOSIS — E669 Obesity, unspecified: Secondary | ICD-10-CM

## 2024-02-05 DIAGNOSIS — M8580 Other specified disorders of bone density and structure, unspecified site: Secondary | ICD-10-CM

## 2024-02-05 DIAGNOSIS — I10 Essential (primary) hypertension: Secondary | ICD-10-CM | POA: Diagnosis not present

## 2024-02-05 DIAGNOSIS — R7303 Prediabetes: Secondary | ICD-10-CM | POA: Diagnosis not present

## 2024-02-05 DIAGNOSIS — Z6834 Body mass index (BMI) 34.0-34.9, adult: Secondary | ICD-10-CM

## 2024-02-05 DIAGNOSIS — E782 Mixed hyperlipidemia: Secondary | ICD-10-CM

## 2024-02-05 DIAGNOSIS — E559 Vitamin D deficiency, unspecified: Secondary | ICD-10-CM

## 2024-02-05 MED ORDER — VITAMIN D (ERGOCALCIFEROL) 1.25 MG (50000 UNIT) PO CAPS: 50000.0000 [IU] | ORAL_CAPSULE | ORAL | 0 refills | Status: DC

## 2024-02-05 NOTE — Patient Instructions (Signed)
 The 10-year ASCVD risk score (Arnett DK, et al., 2019) is: 20%   Values used to calculate the score:     Age: 73 years     Clincally relevant sex: Female     Is Non-Hispanic African American: No     Diabetic: No     Tobacco smoker: No     Systolic Blood Pressure: 132 mmHg     Is BP treated: Yes     HDL Cholesterol: 63 mg/dL     Total Cholesterol: 192 mg/dL

## 2024-02-07 NOTE — Progress Notes (Signed)
 Barnie DOROTHA Jenkins, D.O.  ABFM, ABOM Specializing in Clinical Bariatric Medicine  Office located at: [address] [place], [place]  [address]      A) FOR THE CHRONIC DISEASE OF OBESITY:  Chief complaint: Obesity Eileen Morgan is here to discuss her progress with her obesity treatment plan.   History of present illness / Interval history:  Eileen Morgan is here today for her follow-up office visit.  Since last OV on 01/23/2024, pt is down 2 lbs.   Pt states that she has no motivation to walk, but she can tell a difference when she loses weight. Reports that she feels good on the CAT 2 MP.     01/23/24 13:00 02/05/24 14:00   Body Fat % 43 % 42.2 %  Muscle Mass (lbs) 102 lbs 102.2 lbs  Fat Mass (lbs) 81 lbs 78.6 lbs  Total Body Water (lbs) 71.2 lbs 68.2 lbs  Visceral Fat Rating  14 13   Counseling done on how various foods will affect these numbers and how to maximize success   Total lbs lost to date: -22 lbs Total Fat Mass in lbs lost to date: -17.2 Total weight loss percentage to date: -10.58 %   Nutrition Therapy She is on the Category 2 Plan and states she is following her eating plan approximately 90 % of the time.   - Tracking Calories/Macros: no -    - Eating More Whole Foods: no -    - Adequate Protein Intake: yes  - Adequate Water Intake: no -    - Skipping Meals: no -    - Sleeping 7-9 Hours/ Night: no -     Eileen Morgan is currently in the action stage of change. As such, her goal is to continue weight management plan.  She has agreed to: continue current plan   Physical Activity Pt is walking 30 minutes 4 days per week   Eileen Morgan has been advised to work up to 300-450 minutes of moderate intensity aerobic activity a week and strengthening exercises 2-3 times per week for cardiovascular health, weight loss maintenance and preservation of muscle mass.  She has agreed to : Think about enjoyable ways to increase daily physical activity and overcoming barriers to  exercise and Increase physical activity in their day and reduce sedentary time (increase NEAT).   Behavioral Modifications Evidence-based interventions for health behavior change were utilized today including the discussion of  1) self monitoring techniques:  none 2) problem-solving barriers:  none 3) self care:  none 4) SMART goals for next OV:  Exercise everyday for 30 minutes and increase water intake.  Regarding patient's less desirable eating habits and patterns, we employed the technique of small changes.   We discussed the following today: increasing water intake , Self- help books ex. The 5- second rule, and getting involved in the community more.  Additional resources provided today: NEAT handout   Medical Interventions/ Pharmacotherapy Previous Bariatric surgery: no Pharmacotherapy for weight loss: She is currently taking Zepbound for medical weight loss.    We discussed various medication options to help Eileen Morgan with her weight loss efforts and we both agreed to : Continue with current nutritional and behavioral strategies   B) OBESITY RELATED CONDITIONS ADDRESSED TODAY: Obesity (BMI 30-39.9), Starting BMI 38.03 BMI 34.0-34.9,adult Current BMI 34.01  Prediabetes Assessment & Plan Lab Results  Component Value Date   HGBA1C 5.4 12/09/2023   HGBA1C 5.8 (H) 06/06/2023   HGBA1C 5.7 (A) 01/01/2023   INSULIN  6.7 06/06/2023  Managed with diet and lifestyle interventions A1c improved from 5.8 on 06/06/2023 to 5.4 on 12/09/2023. No acute concerns today. Pt states the she doesn't do as well on MP and lifestyle interventions when she's at her apartment by herself but does better when she's with her family. Encouraged pt to become more involved with the senior center, where they have many activities ex. free exercise classes, bingo, so she leaves her apartment. Cont. Prudent nutritional plan and lifestyle interventions    Hypertension, unspecified type Assessment & Plan BP Readings  from Last 3 Encounters:  02/05/24 132/77  01/23/24 138/75  12/09/23 129/80   The 10-year ASCVD risk score (Arnett DK, et al., 2019) is: 20%  Lab Results  Component Value Date   CREATININE 1.00 10/28/2023  Controlled by hydrochlorothiazide  25 mg daily. BP is at goal today. Optimal <120/80. Pt reports that at home her blood pressure goes up to the 140s-150's, but today pt asx. No acute concerns today. Continue monitoring BP at home. Encouraged to continue with low sodium, heart healthy diet. Encouraged to increase exercise as able.     Mixed hyperlipidemia Assessment & Plan Lab Results  Component Value Date   CHOL 192 12/09/2023   HDL 63 12/09/2023   LDLCALC 105 (H) 12/09/2023   LDLDIRECT 105 (H) 12/09/2023   TRIG 139 12/09/2023   CHOLHDL 3.0 12/09/2023  The 10-year ASCVD risk score (Arnett DK, et al., 2019) is: 20%   Values used to calculate the score:     Age: 73 years     Clincally relevant sex: Female     Is Non-Hispanic African American: No     Diabetic: No     Tobacco smoker: No     Systolic Blood Pressure: 132 mmHg     Is BP treated: Yes     HDL Cholesterol: 63 mg/dL     Total Cholesterol: 192 mg/dL  Pt used to be a tobacco smoker, but has quit. HLD not managed by any medications. LDL slightly elevated at 105, optimal <100. HDL, Cholesterol, and Trigs are WNL.  F/up with PCP, through MyChart, about high cholesterol, the possiblity of getting on cholesterol meds, and obtaining a coronary calcium score to evaluate coronary vessels. Continue low trans and saturated fats. Decrease fried foods, fatty meats, saturated and trans fats and increase exercise as tolerated.     Vitamin D  deficiency- (with osteopenia) Assessment & Plan Lab Results  Component Value Date   VD25OH 54.5 10/30/2023   VD25OH 48.2 10/28/2023   VD25OH 25.3 (L) 06/06/2023  Currently on Ergo 50,000 units weekly with good compliance and tolerance. No adverse side effects. Vit D levels are at goal. No acute  concerns. Cont supplementation (refill today) and recheck levels as deemed necessary.         Medications Discontinued During This Encounter  Medication Reason   Vitamin D , Ergocalciferol , (DRISDOL ) 1.25 MG (50000 UNIT) CAPS capsule Reorder     Meds ordered this encounter  Medications   Vitamin D , Ergocalciferol , (DRISDOL ) 1.25 MG (50000 UNIT) CAPS capsule    Sig: Take 1 capsule (50,000 Units total) by mouth every 7 (seven) days.    Dispense:  4 capsule    Refill:  0      Follow up:   Return 02/19/2024 12:20 PM.  She was informed of the importance of frequent follow up visits to maximize her success with intensive lifestyle modifications for her multiple health conditions.   Weight Summary and Biometrics    Objective:  PHYSICAL EXAM: Blood pressure 132/77, pulse (!) 54, temperature 97.9 F (36.6 C), height 5' 2 (1.575 m), weight 186 lb (84.4 kg), SpO2 97%. Body mass index is 34.02 kg/m.  General: she is overweight, cooperative and in no acute distress. PSYCH: Has normal mood, affect and thought process.   HEENT: EOMI, sclerae are anicteric. Lungs: Normal breathing effort, no conversational dyspnea. Extremities: Moves * 4 Neurologic: A and O * 3, good insight  DIAGNOSTIC DATA REVIEWED: BMET    Component Value Date/Time   NA 141 10/28/2023 1434   K 4.6 10/28/2023 1434   CL 102 10/28/2023 1434   CO2 26 10/28/2023 1434   GLUCOSE 87 10/28/2023 1434   GLUCOSE 169 (H) 03/21/2019 0356   BUN 19 10/28/2023 1434   CREATININE 1.00 10/28/2023 1434   CALCIUM 9.3 10/28/2023 1434   GFRNONAA 87 06/16/2019 1150   GFRAA 100 06/16/2019 1150   Lab Results  Component Value Date   HGBA1C 5.4 12/09/2023   HGBA1C 5.5 12/22/2020   Lab Results  Component Value Date   INSULIN  6.7 06/06/2023   Lab Results  Component Value Date   TSH 3.010 06/06/2023   CBC    Component Value Date/Time   WBC 6.0 10/28/2023 1434   WBC 5.9 03/21/2019 0356   RBC 4.70 10/28/2023 1434    RBC 4.49 03/21/2019 0356   HGB 14.8 10/28/2023 1434   HCT 44.8 10/28/2023 1434   PLT 236 10/28/2023 1434   MCV 95 10/28/2023 1434   MCH 31.5 10/28/2023 1434   MCH 29.6 03/21/2019 0356   MCHC 33.0 10/28/2023 1434   MCHC 32.0 03/21/2019 0356   RDW 12.5 10/28/2023 1434   Iron Studies    Component Value Date/Time   IRON 100 10/28/2023 1434   TIBC 386 07/03/2023 1006   FERRITIN 155 (H) 07/03/2023 1006   IRONPCTSAT 27 07/03/2023 1006   Lipid Panel     Component Value Date/Time   CHOL 192 12/09/2023 1209   TRIG 139 12/09/2023 1209   HDL 63 12/09/2023 1209   CHOLHDL 3.0 12/09/2023 1209   LDLCALC 105 (H) 12/09/2023 1209   LDLDIRECT 105 (H) 12/09/2023 1209   Hepatic Function Panel     Component Value Date/Time   PROT 6.1 10/28/2023 1434   ALBUMIN 4.0 10/28/2023 1434   AST 20 10/28/2023 1434   ALT 21 10/28/2023 1434   ALKPHOS 82 10/28/2023 1434   BILITOT 0.4 10/28/2023 1434   BILIDIR 0.12 07/03/2022 1041      Component Value Date/Time   TSH 3.010 06/06/2023 1440   Nutritional Lab Results  Component Value Date   VD25OH 54.5 10/30/2023   VD25OH 48.2 10/28/2023   VD25OH 25.3 (L) 06/06/2023    Attestations:   LILLETTE Feliciano Mingle, acting as a Stage manager for Barnie Jenkins, DO., have compiled all relevant documentation for today's office visit on behalf of Barnie Jenkins, DO, while in the presence of [employer], DO.  I have spent 40 minutes in the care of the patient today including 30 minutes face-to-face assessing and reviewing listed medical problems above as outlined in office visit note and providing nutritional and behavioral counseling as outlined in obesity care plan.   I have reviewed the above documentation for accuracy and completeness, and I agree with the above. Barnie JINNY Jenkins, D.O.  The 21st Century Cures Act was signed into law in 2016 which includes the topic of electronic health records.  This provides immediate access to information in MyChart.   This  includes consultation notes, operative notes, office notes, lab results and pathology reports.  If you have any questions about what you read please let us  know at your next visit so we can discuss your concerns and take corrective action if need be.  We are right here with you.

## 2024-02-11 ENCOUNTER — Ambulatory Visit: Payer: Self-pay

## 2024-02-19 ENCOUNTER — Ambulatory Visit (INDEPENDENT_AMBULATORY_CARE_PROVIDER_SITE_OTHER): Admitting: Family Medicine

## 2024-02-26 ENCOUNTER — Ambulatory Visit (INDEPENDENT_AMBULATORY_CARE_PROVIDER_SITE_OTHER): Admitting: Family Medicine

## 2024-02-26 ENCOUNTER — Encounter (INDEPENDENT_AMBULATORY_CARE_PROVIDER_SITE_OTHER): Payer: Self-pay | Admitting: Family Medicine

## 2024-02-26 VITALS — BP 134/80 | HR 49 | Temp 97.4°F | Ht 62.0 in | Wt 188.0 lb

## 2024-02-26 DIAGNOSIS — E559 Vitamin D deficiency, unspecified: Secondary | ICD-10-CM | POA: Diagnosis not present

## 2024-02-26 DIAGNOSIS — E782 Mixed hyperlipidemia: Secondary | ICD-10-CM | POA: Diagnosis not present

## 2024-02-26 DIAGNOSIS — E669 Obesity, unspecified: Secondary | ICD-10-CM | POA: Diagnosis not present

## 2024-02-26 DIAGNOSIS — R7303 Prediabetes: Secondary | ICD-10-CM

## 2024-02-26 DIAGNOSIS — Z6834 Body mass index (BMI) 34.0-34.9, adult: Secondary | ICD-10-CM

## 2024-02-26 MED ORDER — VITAMIN D (ERGOCALCIFEROL) 1.25 MG (50000 UNIT) PO CAPS: 50000.0000 [IU] | ORAL_CAPSULE | ORAL | 0 refills | Status: DC

## 2024-02-26 NOTE — Progress Notes (Signed)
 Eileen Morgan, D.O.  ABFM, ABOM Specializing in Clinical Bariatric Medicine  Office located at: [address] [place], [place]  [address]    FOR THE CHRONIC DISEASE OF OBESITY:   Obesity (BMI 30-39.9), Starting BMI 38.03; BMI 34.0-34.9,adult Current BMI 34.38  Weight Summary and Body Composition Analysis  Weight Lost Since Last Visit: 0  Weight Gained Since Last Visit: 2lb    Vitals Temp: (!) 97.4 F (36.3 C) BP: 134/80 Pulse Rate: (!) 49 SpO2: 96 %   Anthropometric Measurements Height: 5' 2 (1.575 m) Weight: 188 lb (85.3 kg) BMI (Calculated): 34.38 Weight at Last Visit: 186lb Weight Lost Since Last Visit: 0 Weight Gained Since Last Visit: 2lb Starting Weight: 208lb Total Weight Loss (lbs): 20 lb (9.072 kg) Peak Weight: 211lb   Body Composition  Body Fat %: 42 % Fat Mass (lbs): 79.2 lbs Muscle Mass (lbs): 104 lbs Total Body Water (lbs): 69.2 lbs Visceral Fat Rating : 14   Other Clinical Data Fasting: no Labs: no Today's Visit #: 12 Starting Date: 06/06/23    Chief complaint: Obesity  Interval History Eileen Morgan is here for a follow-up office visit to discuss her progress with her obesity treatment plan. She is on the Category 2 Plan and states she is following her eating plan approximately 80 % of the time. She is not exercising.  She has experienced a weight gain of 2 lbs since last OV on 02/05/2024.   She went to the fair over the weekend. She indulged in some rich foods but walked about 3-4 hours  Her dietary and life habits include:  - Tracking Calories/Macros: no, she is not on a journaling plan  - Eating More Whole Foods: yes  - Adequate Protein Intake: pt not measuring her protein intake  - Adequate Water Intake: no - drinking about 48 ounces daily  - Skipping Meals: no  - Sleeping 7-9 Hours/ Night: yes   02/05/24 14:00 02/26/24 11:00   Body Fat % 42.2 % 42 %  Muscle Mass (lbs) 102.2 lbs 104 lbs  Fat Mass (lbs) 78.6  lbs 79.2 lbs  Total Body Water (lbs) 68.2 lbs 69.2 lbs  Visceral Fat Rating  13 14   Counseling done on how various foods will affect these numbers and how to maximize success  Total lbs lost to date: - 20 lbs Total Fat Mass lost to date: -16.6 lbs Total weight loss percentage to date: - 9.62 %   Nutritional and Behavioral Counseling:  We discussed the following today: low-carb hot dog buns, high protein pasta options, focusing on food with a 10:1 ratio of calories: grams of protein, increasing fiber rich foods, and making foods they love in a healthy manner  Additional resources provided today: Handout on benefits of metformin for weight loss  Evidence-based interventions for health behavior change were utilized today including the discussion of self monitoring techniques, problem-solving barriers and SMART goal setting techniques.   Regarding patient's less desirable eating habits and patterns, we employed the technique of small changes.   SMART Goal(s) created today: n/a   Recommended Dietary Goals Eileen Morgan is currently in the action stage of change. As such, her goal is to continue weight management plan.  She has agreed to continue the CAT 2 MP.   Recommended Physical Activity Goals Eileen Morgan has been advised to work up to 300-450 minutes of moderate intensity aerobic activity a week and strengthening exercises 2-3 times per week for cardiovascular health, weight loss maintenance and  preservation of muscle mass.   She has agreed to walk 40 minutes daily.   Medical Interventions and Pharmacotherapy Previous Bariatric surgery: n/a Pharmacotherapy: Cont Wellbutrin  at current dose. Consider low dose Metformin in the future.    OBESITY RELATED CONDITIONS ADDRESSED TODAY:   Medications Discontinued During This Encounter  Medication Reason   Vitamin D , Ergocalciferol , (DRISDOL ) 1.25 MG (50000 UNIT) CAPS capsule Reorder     Meds ordered this encounter  Medications   Vitamin D ,  Ergocalciferol , (DRISDOL ) 1.25 MG (50000 UNIT) CAPS capsule    Sig: Take 1 capsule (50,000 Units total) by mouth every 7 (seven) days.    Dispense:  4 capsule    Refill:  0     Mixed hyperlipidemia Assessment & Plan: Lab Results  Component Value Date   CHOL 192 12/09/2023   HDL 63 12/09/2023   LDLCALC 105 (H) 12/09/2023   LDLDIRECT 105 (H) 12/09/2023   TRIG 139 12/09/2023   CHOLHDL 3.0 12/09/2023   The 10-year ASCVD risk score (Arnett DK, et al., 2019) is: 20.5%   Values used to calculate the score:     Age: 73 years     Clincally relevant sex: Female     Is Non-Hispanic African American: No     Diabetic: No     Tobacco smoker: No     Systolic Blood Pressure: 134 mmHg     Is BP treated: Yes     HDL Cholesterol: 63 mg/dL     Total Cholesterol: 192 mg/dL  Former smoker; 1 pack/day, 40 years. Quit about 5 years ago. Reviewed most recent lipid panel with pt. LDL mildly elevated at 105. HDL and TRIG were at goal. Her 10-year ASCVD risk score was elevated at 20.5%. Recommend she f/up with primary care team to discuss initiating statin therapy. Continue prudent nutritional plan low in saturated and trans fats.     Prediabetes Assessment & Plan: Lab Results  Component Value Date   HGBA1C 5.4 12/09/2023   HGBA1C 5.8 (H) 06/06/2023   HGBA1C 5.7 (A) 01/01/2023   INSULIN  6.7 06/06/2023    Pre-DM managed with dietary and life style interventions. Pt aware of disease state. She has good control of hunger and cravings when following her prudent nutritional plan. No acute concerns. Continue working on nutrition plan to decrease simple carbohydrates, increase lean proteins and exercise to promote weight loss and improve glycemic control and prevent progression to T2DM. Consider starting low-dose Metformin in the future if needed; educational handout on Metformin provided.    Vitamin D  deficiency- (with osteopenia) Assessment & Plan: Lab Results  Component Value Date   VD25OH 54.5  10/30/2023   VD25OH 48.2 10/28/2023   VD25OH 25.3 (L) 06/06/2023   Currently on ergocalciferol  50,000 units weekly with good compliance and tolerance. No acute concerns. Continue regimen (refill today). Recheck as deemed medically necessary.   Objective:   PHYSICAL EXAM: Blood pressure 134/80, pulse (!) 49, temperature (!) 97.4 F (36.3 C), height 5' 2 (1.575 m), weight 188 lb (85.3 kg), SpO2 96%. Body mass index is 34.39 kg/m.  General: she is overweight, cooperative and in no acute distress. PSYCH: Has normal mood, affect and thought process.   HEENT: EOMI, sclerae are anicteric. Lungs: Normal breathing effort, no conversational dyspnea. Extremities: Moves * 4 Neurologic: A and O * 3, good insight  DIAGNOSTIC DATA REVIEWED: BMET    Component Value Date/Time   NA 141 10/28/2023 1434   K 4.6 10/28/2023 1434   CL 102 10/28/2023 1434  CO2 26 10/28/2023 1434   GLUCOSE 87 10/28/2023 1434   GLUCOSE 169 (H) 03/21/2019 0356   BUN 19 10/28/2023 1434   CREATININE 1.00 10/28/2023 1434   CALCIUM 9.3 10/28/2023 1434   GFRNONAA 87 06/16/2019 1150   GFRAA 100 06/16/2019 1150   Lab Results  Component Value Date   HGBA1C 5.4 12/09/2023   HGBA1C 5.5 12/22/2020   Lab Results  Component Value Date   INSULIN  6.7 06/06/2023   Lab Results  Component Value Date   TSH 3.010 06/06/2023   CBC    Component Value Date/Time   WBC 6.0 10/28/2023 1434   WBC 5.9 03/21/2019 0356   RBC 4.70 10/28/2023 1434   RBC 4.49 03/21/2019 0356   HGB 14.8 10/28/2023 1434   HCT 44.8 10/28/2023 1434   PLT 236 10/28/2023 1434   MCV 95 10/28/2023 1434   MCH 31.5 10/28/2023 1434   MCH 29.6 03/21/2019 0356   MCHC 33.0 10/28/2023 1434   MCHC 32.0 03/21/2019 0356   RDW 12.5 10/28/2023 1434   Iron Studies    Component Value Date/Time   IRON 100 10/28/2023 1434   TIBC 386 07/03/2023 1006   FERRITIN 155 (H) 07/03/2023 1006   IRONPCTSAT 27 07/03/2023 1006   Lipid Panel     Component Value  Date/Time   CHOL 192 12/09/2023 1209   TRIG 139 12/09/2023 1209   HDL 63 12/09/2023 1209   CHOLHDL 3.0 12/09/2023 1209   LDLCALC 105 (H) 12/09/2023 1209   LDLDIRECT 105 (H) 12/09/2023 1209   Hepatic Function Panel     Component Value Date/Time   PROT 6.1 10/28/2023 1434   ALBUMIN 4.0 10/28/2023 1434   AST 20 10/28/2023 1434   ALT 21 10/28/2023 1434   ALKPHOS 82 10/28/2023 1434   BILITOT 0.4 10/28/2023 1434   BILIDIR 0.12 07/03/2022 1041      Component Value Date/Time   TSH 3.010 06/06/2023 1440   Nutritional Lab Results  Component Value Date   VD25OH 54.5 10/30/2023   VD25OH 48.2 10/28/2023   VD25OH 25.3 (L) 06/06/2023     Follow up:   No follow-ups on file.  She was informed of the importance of frequent follow up visits to maximize her success with intensive lifestyle modifications for her multiple health conditions.   Attestations:   I, Special Puri, acting as a Stage manager for [employer], DO., have compiled all relevant documentation for today's office visit on behalf of Eileen Jenkins, DO, while in the presence of [employer], DO.  Pertinent positives were addressed with patient today. Reviewed by clinician on day of visit: allergies, medications, problem list, medical history, surgical history, family history, social history, and previous encounter notes.  I have spent *** minutes in the care of the patient today including *** minutes face-to-face assessing and reviewing listed medical problems above as outlined in office visit note and providing nutritional and behavioral counseling as outlined in obesity care plan.   I have reviewed the above documentation for accuracy and completeness, and I agree with the above. Eileen JINNY Morgan, D.O.  The 21st Century Cures Act was signed into law in 2016 which includes the topic of electronic health records.  This provides immediate access to information in MyChart. This includes consultation notes, operative  notes, office notes, lab results and pathology reports.  If you have any questions about what you read please let us  know at your next visit so we can discuss your concerns and take corrective action if  need be.  We are right here with you.

## 2024-03-11 ENCOUNTER — Ambulatory Visit (INDEPENDENT_AMBULATORY_CARE_PROVIDER_SITE_OTHER): Admitting: Family Medicine

## 2024-03-11 ENCOUNTER — Encounter (INDEPENDENT_AMBULATORY_CARE_PROVIDER_SITE_OTHER): Payer: Self-pay | Admitting: Family Medicine

## 2024-03-11 VITALS — BP 132/79 | HR 60 | Temp 97.7°F | Ht 62.0 in | Wt 192.0 lb

## 2024-03-11 DIAGNOSIS — E669 Obesity, unspecified: Secondary | ICD-10-CM

## 2024-03-11 DIAGNOSIS — Z6835 Body mass index (BMI) 35.0-35.9, adult: Secondary | ICD-10-CM

## 2024-03-11 DIAGNOSIS — R7303 Prediabetes: Secondary | ICD-10-CM

## 2024-03-11 DIAGNOSIS — E559 Vitamin D deficiency, unspecified: Secondary | ICD-10-CM | POA: Diagnosis not present

## 2024-03-11 MED ORDER — METFORMIN HCL ER 500 MG PO TB24: ORAL_TABLET | ORAL | 0 refills | Status: DC

## 2024-03-11 NOTE — Progress Notes (Signed)
 Eileen DOROTHA Morgan, D.O.  ABFM, ABOM Specializing in Clinical Bariatric Medicine  Office located at: [address] [place], [place]  [address]    FOR THE CHRONIC DISEASE OF OBESITY:   Obesity (BMI 30-39.9), Starting BMI 38.03 BMI 35.0-35.9,adult - current BMI 35.11  Weight Summary and Body Composition Analysis  Weight Lost Since Last Visit: 0lb  Weight Gained Since Last Visit: 4lb    Vitals Temp: 97.7 F (36.5 C) BP: 132/79 Pulse Rate: 60 SpO2: 96 %   Anthropometric Measurements Height: 5' 2 (1.575 m) Weight: 192 lb (87.1 kg) BMI (Calculated): 35.11 Weight at Last Visit: 188lb Weight Lost Since Last Visit: 0lb Weight Gained Since Last Visit: 4lb Starting Weight: 208lb Total Weight Loss (lbs): 16 lb (7.258 kg) Peak Weight: 211lb   Body Composition  Body Fat %: 44.1 % Fat Mass (lbs): 85 lbs Muscle Mass (lbs): 102.2 lbs Total Body Water (lbs): 73.4 lbs Visceral Fat Rating : 14   Other Clinical Data Fasting: no Labs: no Today's Visit #: 13 Starting Date: 06/06/23    Chief complaint: Obesity  Interval History Eileen Morgan is here for a follow-up office visit to discuss her progress with her obesity treatment plan. She is on the Category 2 Plan and states she is following her eating plan approximately 70% of the time. She is walking 25-30 minutes 4 days a week.   She has experienced a weight gain of 4 lbs since last OV on 02/26/2024. She attributes this to eating out more than usual.   Her dietary and life habits include:  - Tracking Calories/Macros: no - she is not on a journaling plan  - Has been eating more ritz crackers and potato chips  - Adequate Protein Intake: yes  - Adequate Water Intake: yes  - Skipping Meals: no  - Sleeping 7-9 Hours/ Night: no    02/26/24 11:00 03/11/24   Body Fat % 42 % 44.1 %  Muscle Mass (lbs) 104 lbs 102.2 lbs  Fat Mass (lbs) 79.2 lbs 85 lbs  Total Body Water (lbs) 69.2 lbs 73.4 lbs  Visceral Fat  Rating  14 14   Counseling done on how various foods will affect these numbers and how to maximize success  Total Fat mass lost to date: - 10.8 lbs Total lbs lost to date: - 16 lbs Total weight loss percentage to date: -7.69 %   Nutritional and Behavioral Counseling:  We discussed the following today: increasing lean protein intake to established goals, decreasing simple carbohydrates , and keeping healthy foods at home  Additional resources provided today: Handout on risks/ benefits of metformin and associated Myths of use and Handout on benefits of metformin for weight loss  Evidence-based interventions for health behavior change were utilized today including the discussion of self monitoring techniques, problem-solving barriers and SMART goal setting techniques.   Regarding patient's less desirable eating habits and patterns, we employed the technique of small changes.   SMART Goal(s) created today: n/a   Recommended Dietary Goals Eileen Morgan is currently in the action stage of change. As such, her goal is to continue weight management plan.  She has agreed to continue the CAT 2 MP.   Recommended Physical Activity Goals Eileen Morgan has been advised to work up to 300-450 minutes of moderate intensity aerobic activity a week and strengthening exercises 2-3 times per week for cardiovascular health, weight loss maintenance and preservation of muscle mass.   She has agreed to: Continue to gradually increase the amount and  intensity of exercise routine   Medical Interventions and Pharmacotherapy Previous Bariatric surgery: n/a Pharmacotherapy: Cont Wellbutrin  at current dose. Start Metformin for cravings and off-label medical weight loss.    OBESITY RELATED CONDITIONS ADDRESSED TODAY:   Orders Placed This Encounter  Procedures   VITAMIN D  25 Hydroxy (Vit-D Deficiency, Fractures)   Basic metabolic panel with GFR   Meds ordered this encounter  Medications   metFORMIN (GLUCOPHAGE-XR) 500  MG 24 hr tablet    Sig: 1 tab daily with meal    Dispense:  30 tablet    Refill:  0     Prediabetes Assessment & Plan: Lab Results  Component Value Date   HGBA1C 5.4 12/09/2023   HGBA1C 5.8 (H) 06/06/2023   HGBA1C 5.7 (A) 01/01/2023   INSULIN  6.7 06/06/2023    No complaints of excessive hunger, but has cravings for salty foods and candy bars. Pt will work on increasing protein intake for craving suppression. In addition, after providing educational handouts, and discussing benefits and side effects, patient will be started on Metformin XR 500 mg daily. Reviewed renal parameters dated 6/25 which were within acceptable ranges; will again check  BMP with GFR. Cont working on nutrition plan to decrease simple carbohydrates, increase lean proteins and exercise to promote weight loss and improve glycemic control and prevent progression to T2DM.     Vitamin D  deficiency- (with osteopenia) Assessment & Plan: Lab Results  Component Value Date   VD25OH 54.5 10/30/2023   VD25OH 48.2 10/28/2023   VD25OH 25.3 (L) 06/06/2023   Currently on ergocalciferol  50,000 units weekly with good compliance and tolerance. No acute concerns. Continue regimen. Recheck  levels today.     Objective:   PHYSICAL EXAM: Blood pressure 132/79, pulse 60, temperature 97.7 F (36.5 C), height 5' 2 (1.575 m), weight 192 lb (87.1 kg), SpO2 96%. Body mass index is 35.12 kg/m.  General: she is overweight, cooperative and in no acute distress. PSYCH: Has normal mood, affect and thought process.   HEENT: EOMI, sclerae are anicteric. Lungs: Normal breathing effort, no conversational dyspnea. Extremities: Moves * 4 Neurologic: A and O * 3, good insight  DIAGNOSTIC DATA REVIEWED: BMET    Component Value Date/Time   NA 141 10/28/2023 1434   K 4.6 10/28/2023 1434   CL 102 10/28/2023 1434   CO2 26 10/28/2023 1434   GLUCOSE 87 10/28/2023 1434   GLUCOSE 169 (H) 03/21/2019 0356   BUN 19 10/28/2023 1434    CREATININE 1.00 10/28/2023 1434   CALCIUM 9.3 10/28/2023 1434   GFRNONAA 87 06/16/2019 1150   GFRAA 100 06/16/2019 1150   Lab Results  Component Value Date   HGBA1C 5.4 12/09/2023   HGBA1C 5.5 12/22/2020   Lab Results  Component Value Date   INSULIN  6.7 06/06/2023   Lab Results  Component Value Date   TSH 3.010 06/06/2023   CBC    Component Value Date/Time   WBC 6.0 10/28/2023 1434   WBC 5.9 03/21/2019 0356   RBC 4.70 10/28/2023 1434   RBC 4.49 03/21/2019 0356   HGB 14.8 10/28/2023 1434   HCT 44.8 10/28/2023 1434   PLT 236 10/28/2023 1434   MCV 95 10/28/2023 1434   MCH 31.5 10/28/2023 1434   MCH 29.6 03/21/2019 0356   MCHC 33.0 10/28/2023 1434   MCHC 32.0 03/21/2019 0356   RDW 12.5 10/28/2023 1434   Iron Studies    Component Value Date/Time   IRON 100 10/28/2023 1434   TIBC 386 07/03/2023 1006  FERRITIN 155 (H) 07/03/2023 1006   IRONPCTSAT 27 07/03/2023 1006   Lipid Panel     Component Value Date/Time   CHOL 192 12/09/2023 1209   TRIG 139 12/09/2023 1209   HDL 63 12/09/2023 1209   CHOLHDL 3.0 12/09/2023 1209   LDLCALC 105 (H) 12/09/2023 1209   LDLDIRECT 105 (H) 12/09/2023 1209   Hepatic Function Panel     Component Value Date/Time   PROT 6.1 10/28/2023 1434   ALBUMIN 4.0 10/28/2023 1434   AST 20 10/28/2023 1434   ALT 21 10/28/2023 1434   ALKPHOS 82 10/28/2023 1434   BILITOT 0.4 10/28/2023 1434   BILIDIR 0.12 07/03/2022 1041      Component Value Date/Time   TSH 3.010 06/06/2023 1440   Nutritional Lab Results  Component Value Date   VD25OH 54.5 10/30/2023   VD25OH 48.2 10/28/2023   VD25OH 25.3 (L) 06/06/2023     Follow up:   Return 04/08/2024 at 10:20 AM.  She was informed of the importance of frequent follow up visits to maximize her success with intensive lifestyle modifications for her multiple health conditions.  Eileen Morgan is aware that we will review all of her lab results at our next visit together in person.  She is aware that  if anything is critical/ life threatening with the results, we will be contacting her via MyChart or by my CMA will be calling them prior to the office visit to discuss acute management.    Attestations:   I, Special Puri, acting as a stage manager for [employer], DO., have compiled all relevant documentation for today's office visit on behalf of Eileen Jenkins, DO, while in the presence of [employer], DO.  Pertinent positives were addressed with patient today. Reviewed by clinician on day of visit: allergies, medications, problem list, medical history, surgical history, family history, social history, and previous encounter notes.  I have reviewed the above documentation for accuracy and completeness, and I agree with the above. Eileen JINNY Morgan, D.O.  The 21st Century Cures Act was signed into law in 2016 which includes the topic of electronic health records.  This provides immediate access to information in MyChart. This includes consultation notes, operative notes, office notes, lab results and pathology reports.  If you have any questions about what you read please let us  know at your next visit so we can discuss your concerns and take corrective action if need be.  We are right here with you.

## 2024-03-12 LAB — BASIC METABOLIC PANEL WITH GFR
BUN/Creatinine Ratio: 25 (ref 12–28)
BUN: 22 mg/dL (ref 8–27)
CO2: 25 mmol/L (ref 20–29)
Calcium: 9.3 mg/dL (ref 8.7–10.3)
Chloride: 103 mmol/L (ref 96–106)
Creatinine, Ser: 0.88 mg/dL (ref 0.57–1.00)
Glucose: 76 mg/dL (ref 70–99)
Potassium: 4.4 mmol/L (ref 3.5–5.2)
Sodium: 142 mmol/L (ref 134–144)
eGFR: 68 mL/min/1.73 (ref >59)

## 2024-03-12 LAB — VITAMIN D 25 HYDROXY (VIT D DEFICIENCY, FRACTURES): Vit D, 25-Hydroxy: 38.8 ng/mL (ref 30.0–100.0)

## 2024-03-13 ENCOUNTER — Other Ambulatory Visit: Payer: Self-pay | Admitting: Nurse Practitioner

## 2024-03-13 DIAGNOSIS — G8929 Other chronic pain: Secondary | ICD-10-CM

## 2024-03-17 NOTE — Telephone Encounter (Signed)
 Please advise [facility]

## 2024-03-25 ENCOUNTER — Ambulatory Visit (INDEPENDENT_AMBULATORY_CARE_PROVIDER_SITE_OTHER): Payer: Self-pay | Admitting: Family Medicine

## 2024-04-02 ENCOUNTER — Other Ambulatory Visit (INDEPENDENT_AMBULATORY_CARE_PROVIDER_SITE_OTHER): Payer: Self-pay | Admitting: Family Medicine

## 2024-04-02 DIAGNOSIS — R7303 Prediabetes: Secondary | ICD-10-CM

## 2024-04-08 ENCOUNTER — Encounter (INDEPENDENT_AMBULATORY_CARE_PROVIDER_SITE_OTHER): Payer: Self-pay | Admitting: Family Medicine

## 2024-04-08 ENCOUNTER — Other Ambulatory Visit: Payer: Self-pay | Admitting: Nurse Practitioner

## 2024-04-08 ENCOUNTER — Ambulatory Visit (INDEPENDENT_AMBULATORY_CARE_PROVIDER_SITE_OTHER): Payer: Self-pay | Admitting: Family Medicine

## 2024-04-08 VITALS — BP 132/80 | HR 62 | Temp 98.1°F | Ht 62.0 in | Wt 186.0 lb

## 2024-04-08 DIAGNOSIS — R7303 Prediabetes: Secondary | ICD-10-CM | POA: Diagnosis not present

## 2024-04-08 DIAGNOSIS — E669 Obesity, unspecified: Secondary | ICD-10-CM

## 2024-04-08 DIAGNOSIS — Z6834 Body mass index (BMI) 34.0-34.9, adult: Secondary | ICD-10-CM

## 2024-04-08 DIAGNOSIS — I10 Essential (primary) hypertension: Secondary | ICD-10-CM | POA: Diagnosis not present

## 2024-04-08 DIAGNOSIS — E559 Vitamin D deficiency, unspecified: Secondary | ICD-10-CM | POA: Diagnosis not present

## 2024-04-08 DIAGNOSIS — E538 Deficiency of other specified B group vitamins: Secondary | ICD-10-CM | POA: Diagnosis not present

## 2024-04-08 DIAGNOSIS — K219 Gastro-esophageal reflux disease without esophagitis: Secondary | ICD-10-CM

## 2024-04-08 MED ORDER — VITAMIN D (ERGOCALCIFEROL) 1.25 MG (50000 UNIT) PO CAPS: 50000.0000 [IU] | ORAL_CAPSULE | ORAL | 0 refills | Status: AC

## 2024-04-08 NOTE — Progress Notes (Signed)
 Eileen DOROTHA Morgan, D.O.  ABFM, ABOM Specializing in Clinical Bariatric Medicine  Office located at: [address] [place], [place]  [address]    FOR THE CHRONIC DISEASE OF OBESITY:   Obesity (BMI 30-39.9), Starting BMI 38.03 BMI 34.0-34.9,adult - Current BMI 34.01  Weight Summary and Body Composition Analysis  Weight Lost Since Last Visit: 6 lb  Weight Gained Since Last Visit: 0    Vitals Temp: 98.1 F (36.7 C) BP: 132/80 Pulse Rate: 62 SpO2: 94 %   Anthropometric Measurements Height: 5' 2 (1.575 m) Weight: 186 lb (84.4 kg) BMI (Calculated): 34.01 Weight at Last Visit: 192 lb Weight Lost Since Last Visit: 6 lb Weight Gained Since Last Visit: 0 Starting Weight: 208 lb Total Weight Loss (lbs): 22 lb (9.979 kg) Peak Weight: 211 lb   Body Composition  Body Fat %: 42.4 % Fat Mass (lbs): 79 lbs Muscle Mass (lbs): 102 lbs Total Body Water (lbs): 66.8 lbs Visceral Fat Rating : 14   Other Clinical Data Fasting: No Labs: No Today's Visit #: 14 Starting Date: 06/06/23    Chief complaint: Obesity  Interval History Eileen Morgan is here for a follow-up office visit to discuss her progress with her obesity treatment plan. She is on the Category 2 Plan and states she is following her eating plan approximately 70 % of the time. She is walking 30 minutes 4  days per week  She has experienced a weight loss of 6 lbs since last OV on 03/11/2024. She attributes this to focusing more on portion control.  Her dietary and life habits include:  - Tracking Calories/Macros: not on a tracking plan  - Eating More Whole Foods: yes  - Adequate Protein Intake: yes  - Adequate Water Intake: yes  - Skipping Meals: no  - Sleeping 7-9 Hours/ Night: yes    03/11/24 04/08/24 10:00   Body Fat % 44.1 % 42.4 %  Muscle Mass (lbs) 102.2 lbs 102 lbs  Fat Mass (lbs) 85 lbs 79 lbs  Total Body Water (lbs) 73.4 lbs 66.8 lbs  Visceral Fat Rating  14 14   Counseling done on  how various foods will affect these numbers and how to maximize success  Total Fat mass lost to date: - 16.8 lbs Total lbs lost to date: - 22 lbs Total weight loss percentage to date: - 10.58 %   Nutritional and Behavioral Counseling:  We discussed the following today: continue to work on maintaining a reduced calorie state, getting the recommended amount of protein, incorporating whole foods, making healthy choices, staying well hydrated and practicing mindfulness when eating.  Additional resources provided today: Handout on holiday eating strategies and Handout on December Goals  Evidence-based interventions for health behavior change were utilized today including the discussion of self monitoring techniques, problem-solving barriers and SMART goal setting techniques.   Regarding patient's less desirable eating habits and patterns, we employed the technique of small changes.   SMART Goal(s) created today: drinking 90-100 ounces of water daily + lose wt in December    Recommended Dietary Goals Eileen Morgan is currently in the action stage of change. As such, her goal is to continue weight management plan.  She has agreed to continue the CAT 2 MP.   Recommended Physical Activity Goals Eileen Morgan has been advised to work up to 300-450 minutes of moderate intensity aerobic activity a week and strengthening exercises 2-3 times per week for cardiovascular health, weight loss maintenance and preservation of muscle mass.  She has agreed to do 30 minutes of purposeful movement daily.   Medical Interventions and Pharmacotherapy Previous Bariatric surgery: n/a Pharmacotherapy: Cont Wellbutrin  at current dose. Start Metformin .   OBESITY RELATED CONDITIONS ADDRESSED TODAY:    Medications Discontinued During This Encounter  Medication Reason   Vitamin D , Ergocalciferol , (DRISDOL ) 1.25 MG (50000 UNIT) CAPS capsule Reorder     Meds ordered this encounter  Medications   Vitamin D , Ergocalciferol ,  (DRISDOL ) 1.25 MG (50000 UNIT) CAPS capsule    Sig: Take 1 capsule (50,000 Units total) by mouth every 7 (seven) days.    Dispense:  12 capsule    Refill:  0     Prediabetes Assessment & Plan: Lab Results  Component Value Date   HGBA1C 5.4 12/09/2023   HGBA1C 5.8 (H) 06/06/2023   HGBA1C 5.7 (A) 01/01/2023   INSULIN  6.7 06/06/2023   Lab Results  Component Value Date   CREATININE 0.88 03/11/2024   BUN 22 03/11/2024   NA 142 03/11/2024   K 4.4 03/11/2024   CL 103 03/11/2024   CO2 25 03/11/2024   I wrote for Metformin  XR 500 mg daily LOV as she mentioned having cravings for salty foods and candy bars. However, she did not start it because of a stomach bug. This issue has resolved now and she plans to take the Metformin . Of note, her kidney function and electrolytes are WNL. Continue working on nutrition plan to decrease simple carbohydrates, increase lean proteins and exercise to promote weight loss.    Hypertension, unspecified type Assessment & Plan: BP Readings from Last 3 Encounters:  04/08/24 132/80  03/11/24 132/79  02/26/24 134/80   Blood pressure at goal today. No acute concerns. Cont hydrochlorothiazide  25 mg daily and  low sodium diet. Advance exercise as tolerated.    Vitamin D  deficiency- (with osteopenia) Assessment & Plan: Lab Results  Component Value Date   VD25OH 38.8 03/11/2024   VD25OH 54.5 10/30/2023   VD25OH 48.2 10/28/2023   Reviewed labs with pt. Her Vit D levels have decreased to 38.8 and is not at goal of 50 to 70. She reports very good compliance with Ergocalciferol  50,000 units weekly. Cont regimen (refill) and wt loss efforts; recheck levels in the future.      B12 deficiency due to diet Assessment & Plan: Lab Results  Component Value Date   VITAMINB12 796 12/09/2023   B12 level is 796, which is at goal of over 400- 500.  Maintain with OTC B12 500 mcg daily and nutrient rich diet. Recheck levels as deemed medically necessary.     Objective:   PHYSICAL EXAM: Blood pressure 132/80, pulse 62, temperature 98.1 F (36.7 C), height 5' 2 (1.575 m), weight 186 lb (84.4 kg), SpO2 94%. Body mass index is 34.02 kg/m.  General: she is overweight, cooperative and in no acute distress. PSYCH: Has normal mood, affect and thought process.   HEENT: EOMI, sclerae are anicteric. Lungs: Normal breathing effort, no conversational dyspnea. Extremities: Moves * 4 Neurologic: A and O * 3, good insight  DIAGNOSTIC DATA REVIEWED: BMET    Component Value Date/Time   NA 142 03/11/2024 1258   K 4.4 03/11/2024 1258   CL 103 03/11/2024 1258   CO2 25 03/11/2024 1258   GLUCOSE 76 03/11/2024 1258   GLUCOSE 169 (H) 03/21/2019 0356   BUN 22 03/11/2024 1258   CREATININE 0.88 03/11/2024 1258   CALCIUM 9.3 03/11/2024 1258   GFRNONAA 87 06/16/2019 1150   GFRAA 100 06/16/2019 1150  Lab Results  Component Value Date   HGBA1C 5.4 12/09/2023   HGBA1C 5.5 12/22/2020   Lab Results  Component Value Date   INSULIN  6.7 06/06/2023   Lab Results  Component Value Date   TSH 3.010 06/06/2023   CBC    Component Value Date/Time   WBC 6.0 10/28/2023 1434   WBC 5.9 03/21/2019 0356   RBC 4.70 10/28/2023 1434   RBC 4.49 03/21/2019 0356   HGB 14.8 10/28/2023 1434   HCT 44.8 10/28/2023 1434   PLT 236 10/28/2023 1434   MCV 95 10/28/2023 1434   MCH 31.5 10/28/2023 1434   MCH 29.6 03/21/2019 0356   MCHC 33.0 10/28/2023 1434   MCHC 32.0 03/21/2019 0356   RDW 12.5 10/28/2023 1434   Iron Studies    Component Value Date/Time   IRON 100 10/28/2023 1434   TIBC 386 07/03/2023 1006   FERRITIN 155 (H) 07/03/2023 1006   IRONPCTSAT 27 07/03/2023 1006   Lipid Panel     Component Value Date/Time   CHOL 192 12/09/2023 1209   TRIG 139 12/09/2023 1209   HDL 63 12/09/2023 1209   CHOLHDL 3.0 12/09/2023 1209   LDLCALC 105 (H) 12/09/2023 1209   LDLDIRECT 105 (H) 12/09/2023 1209   Hepatic Function Panel     Component Value Date/Time    PROT 6.1 10/28/2023 1434   ALBUMIN 4.0 10/28/2023 1434   AST 20 10/28/2023 1434   ALT 21 10/28/2023 1434   ALKPHOS 82 10/28/2023 1434   BILITOT 0.4 10/28/2023 1434   BILIDIR 0.12 07/03/2022 1041      Component Value Date/Time   TSH 3.010 06/06/2023 1440   Nutritional Lab Results  Component Value Date   VD25OH 38.8 03/11/2024   VD25OH 54.5 10/30/2023   VD25OH 48.2 10/28/2023     Follow up:   Return 05/13/2024 at 11:00 AM.  She was informed of the importance of frequent follow up visits to maximize her success with intensive lifestyle modifications for her multiple health conditions.   Attestations:   I, Special Puri, acting as a stage manager for [employer], DO., have compiled all relevant documentation for today's office visit on behalf of Eileen Jenkins, DO, while in the presence of [employer], DO.  Pertinent positives were addressed with patient today. Reviewed by clinician on day of visit: allergies, medications, problem list, medical history, surgical history, family history, social history, and previous encounter notes.  I have reviewed the above documentation for accuracy and completeness, and I agree with the above. Eileen Morgan, D.O.  The 21st Century Cures Act was signed into law in 2016 which includes the topic of electronic health records.  This provides immediate access to information in MyChart. This includes consultation notes, operative notes, office notes, lab results and pathology reports.  If you have any questions about what you read please let us  know at your next visit so we can discuss your concerns and take corrective action if need be.  We are right here with you.

## 2024-04-17 ENCOUNTER — Other Ambulatory Visit: Payer: Self-pay | Admitting: Nurse Practitioner

## 2024-04-17 DIAGNOSIS — Z1231 Encounter for screening mammogram for malignant neoplasm of breast: Secondary | ICD-10-CM

## 2024-04-20 ENCOUNTER — Ambulatory Visit (INDEPENDENT_AMBULATORY_CARE_PROVIDER_SITE_OTHER): Payer: Self-pay | Admitting: Nurse Practitioner

## 2024-04-20 ENCOUNTER — Encounter: Payer: Self-pay | Admitting: Nurse Practitioner

## 2024-04-20 VITALS — BP 135/69 | HR 62 | Wt 192.2 lb

## 2024-04-20 DIAGNOSIS — R051 Acute cough: Secondary | ICD-10-CM | POA: Diagnosis not present

## 2024-04-20 DIAGNOSIS — M25561 Pain in right knee: Secondary | ICD-10-CM | POA: Diagnosis not present

## 2024-04-20 DIAGNOSIS — R413 Other amnesia: Secondary | ICD-10-CM

## 2024-04-20 DIAGNOSIS — G8929 Other chronic pain: Secondary | ICD-10-CM

## 2024-04-20 DIAGNOSIS — H04203 Unspecified epiphora, bilateral lacrimal glands: Secondary | ICD-10-CM | POA: Diagnosis not present

## 2024-04-20 DIAGNOSIS — Z820 Family history of epilepsy and other diseases of the nervous system: Secondary | ICD-10-CM | POA: Diagnosis not present

## 2024-04-20 DIAGNOSIS — I10 Essential (primary) hypertension: Secondary | ICD-10-CM

## 2024-04-20 MED ORDER — MELOXICAM 15 MG PO TABS: 15.0000 mg | ORAL_TABLET | Freq: Every day | ORAL | 3 refills | Status: AC

## 2024-04-20 MED ORDER — OLOPATADINE HCL 0.1 % OP SOLN: 1.0000 [drp] | Freq: Two times a day (BID) | OPHTHALMIC | 12 refills | Status: AC

## 2024-04-20 MED ORDER — PROMETHAZINE-DM 6.25-15 MG/5ML PO SYRP: 5.0000 mL | ORAL_SOLUTION | Freq: Four times a day (QID) | ORAL | 0 refills | Status: AC | PRN

## 2024-04-20 NOTE — Progress Notes (Signed)
 Subjective   Patient ID: Eileen Morgan, female    DOB: January 09, 1951, 73 y.o.   MRN: [id number]  Chief Complaint  Patient presents with   vitamin D  def   Hypertension   Follow-up   Labs Only    Would like to have Prec[employer] AD2 blood test performed to test for alzheimer's disease    Referral    Allergist     Referring provider: Oley Bascom RAMAN, NP  Eileen Morgan is a 73 y.o. female with Past Medical History: No date: Back pain No date: COPD (chronic obstructive pulmonary disease) (HCC) No date: GERD (gastroesophageal reflux disease) No date: Pulmonary embolism (HCC) No date: Seizures (HCC)   HPI  Patient presents today for a follow-up visit.  Currently she does not need any refills.  She is having a cough.  This started couple days ago.  We will swab her for COVID and flu today.  We will order cough syrup.  Patient is requesting a referral to allergy specialist for watery eyes.  We will place referral per patient request and order eyedrops.  Patient also needs a referral to neurology for concerns about dementia.  Patient has been having memory loss she does have a history of seizures and did recently have a brother to be diagnosed with Alzheimer's. Denies f/c/s, n/v/d, hemoptysis, PND, leg swelling Denies chest pain or edema     Allergies[1]  Immunization History  Administered Date(s) Administered   Fluad Quad(high Dose 65+) 03/27/2022   Influenza,inj,Quad PF,6+ Mos 04/23/2020, 03/28/2021   PFIZER(Purple Top)SARS-COV-2 Vaccination 04/23/2020, 05/11/2020   Pneumococcal Conjugate-13 06/21/2020   Pneumococcal Polysaccharide-23 01/01/2023   Tdap 03/28/2021    Tobacco History: Tobacco Use History[2] Counseling given: Not Answered   Outpatient Encounter Medications as of 04/20/2024  Medication Sig   buPROPion  (WELLBUTRIN  SR) 200 MG 12 hr tablet TAKE 1 TABLET BY MOUTH TWICE A DAY   cyanocobalamin  (VITAMIN B12) 500 MCG tablet Take 1 tablet (500 mcg total) by mouth daily.    folic acid  (FOLVITE ) 800 MCG tablet Take 0.5 tablets (400 mcg total) by mouth daily.   hydrochlorothiazide  (HYDRODIURIL ) 25 MG tablet TAKE 1 TABLET (25 MG TOTAL) BY MOUTH DAILY.   levETIRAcetam  (KEPPRA ) 500 MG tablet Take 1 tablet (500 mg total) by mouth 2 (two) times daily.   loratadine  (CLARITIN ) 10 MG tablet Take 10 mg by mouth as needed for allergies.   metFORMIN  (GLUCOPHAGE -XR) 500 MG 24 hr tablet 1 tab daily with meal   olopatadine  (PATADAY ) 0.1 % ophthalmic solution Place 1 drop into both eyes 2 (two) times daily.   omeprazole  (PRILOSEC) 20 MG capsule TAKE 1 CAPSULE BY MOUTH EVERY DAY   oxybutynin  (DITROPAN -XL) 10 MG 24 hr tablet Take 1 tablet (10 mg total) by mouth at bedtime.   OXYGEN  Inhale 2 L into the lungs daily as needed (oxygen ).   Vitamin D , Ergocalciferol , (DRISDOL ) 1.25 MG (50000 UNIT) CAPS capsule Take 1 capsule (50,000 Units total) by mouth every 7 (seven) days.   [DISCONTINUED] meloxicam  (MOBIC ) 15 MG tablet TAKE 1 TABLET (15 MG TOTAL) BY MOUTH DAILY.   meloxicam  (MOBIC ) 15 MG tablet Take 1 tablet (15 mg total) by mouth daily.   No facility-administered encounter medications on file as of 04/20/2024.    Review of Systems  Review of Systems  Constitutional: Negative.   HENT: Negative.    Cardiovascular: Negative.   Gastrointestinal: Negative.   Allergic/Immunologic: Negative.   Neurological: Negative.   Psychiatric/Behavioral: Negative.       Objective:  BP 135/69 (BP Location: Left Arm, Patient Position: Sitting, Cuff Size: Large)   Pulse 62   Wt 192 lb 3.2 oz (87.2 kg)   SpO2 95%   BMI 35.15 kg/m   Wt Readings from Last 5 Encounters:  04/20/24 192 lb 3.2 oz (87.2 kg)  04/08/24 186 lb (84.4 kg)  03/11/24 192 lb (87.1 kg)  02/26/24 188 lb (85.3 kg)  02/05/24 186 lb (84.4 kg)     Physical Exam Vitals and nursing note reviewed.  Constitutional:      General: She is not in acute distress.    Appearance: She is well-developed.  Cardiovascular:      Rate and Rhythm: Normal rate and regular rhythm.  Pulmonary:     Effort: Pulmonary effort is normal.     Breath sounds: Normal breath sounds.  Neurological:     Mental Status: She is alert and oriented to person, place, and time.       Assessment & Plan:   Memory loss -     Ambulatory referral to Neurology  Family history of Alzheimer disease -     Ambulatory referral to Neurology  Watery eyes -     Olopatadine  HCl; Place 1 drop into both eyes 2 (two) times daily.  Dispense: 5 mL; Refill: 12 -     Ambulatory referral to Allergy  Chronic pain of right knee -     Meloxicam ; Take 1 tablet (15 mg total) by mouth daily.  Dispense: 30 tablet; Refill: 3  Essential hypertension -     CBC -     Comprehensive metabolic panel with GFR     Return in about 6 months (around 10/19/2024).     Bascom GORMAN Borer, NP 04/20/2024     [1]  Allergies Allergen Reactions   Penicillins Anaphylaxis   Yellow Jacket Venom   [2]  Social History Tobacco Use  Smoking Status Former   Current packs/day: 0.00   Average packs/day: 1 pack/day for 40.0 years (40.0 ttl pk-yrs)   Types: Cigarettes   Start date: 03/08/1979   Quit date: 03/08/2019   Years since quitting: 5.1  Smokeless Tobacco Never

## 2024-04-21 ENCOUNTER — Ambulatory Visit: Payer: Self-pay

## 2024-04-21 VITALS — BP 135/69 | Ht 62.0 in | Wt 190.0 lb

## 2024-04-21 DIAGNOSIS — Z0000 Encounter for general adult medical examination without abnormal findings: Secondary | ICD-10-CM

## 2024-04-21 LAB — COMPREHENSIVE METABOLIC PANEL WITH GFR
ALT: 14 IU/L (ref 0–32)
AST: 17 IU/L (ref 0–40)
Albumin: 4.3 g/dL (ref 3.8–4.8)
Alkaline Phosphatase: 81 IU/L (ref 49–135)
BUN/Creatinine Ratio: 24 (ref 12–28)
BUN: 19 mg/dL (ref 8–27)
Bilirubin Total: 0.4 mg/dL (ref 0.0–1.2)
CO2: 25 mmol/L (ref 20–29)
Calcium: 9.4 mg/dL (ref 8.7–10.3)
Chloride: 101 mmol/L (ref 96–106)
Creatinine, Ser: 0.79 mg/dL (ref 0.57–1.00)
Globulin, Total: 2 g/dL (ref 1.5–4.5)
Glucose: 90 mg/dL (ref 70–99)
Potassium: 4.1 mmol/L (ref 3.5–5.2)
Sodium: 141 mmol/L (ref 134–144)
Total Protein: 6.3 g/dL (ref 6.0–8.5)
eGFR: 78 mL/min/1.73 (ref >59)

## 2024-04-21 LAB — CBC
Hematocrit: 46.8 % — ABNORMAL HIGH (ref 34.0–46.6)
Hemoglobin: 15.1 g/dL (ref 11.1–15.9)
MCH: 30.6 pg (ref 26.6–33.0)
MCHC: 32.3 g/dL (ref 31.5–35.7)
MCV: 95 fL (ref 79–97)
Platelets: 232 x10E3/uL (ref 150–450)
RBC: 4.93 x10E6/uL (ref 3.77–5.28)
RDW: 12.6 % (ref 11.7–15.4)
WBC: 6.1 x10E3/uL (ref 3.4–10.8)

## 2024-04-21 NOTE — Patient Instructions (Signed)
 Ms. Lochner,  Thank you for taking the time for your Medicare Wellness Visit. I appreciate your continued commitment to your health goals. Please review the care plan we discussed, and feel free to reach out if I can assist you further.  Please note that Annual Wellness Visits do not include a physical exam. Some assessments may be limited, especially if the visit was conducted virtually. If needed, we may recommend an in-person follow-up with your provider.  Ongoing Care Seeing your primary care provider every 3 to 6 months helps us  monitor your health and provide consistent, personalized care.   Referrals If a referral was made during today's visit and you haven't received any updates within two weeks, please contact the referred provider directly to check on the status.  Recommended Screenings:  Health Maintenance  Topic Date Due   Zoster (Shingles) Vaccine (1 of 2) Never done   COVID-19 Vaccine (3 - Pfizer risk series) 06/08/2020   Medicare Annual Wellness Visit  05/18/2023   Flu Shot  08/04/2024*   Screening for Lung Cancer  05/29/2024   Colon Cancer Screening  01/13/2031   DTaP/Tdap/Td vaccine (2 - Td or Tdap) 03/29/2031   Pneumococcal Vaccine for age over 54  Completed   Osteoporosis screening with Bone Density Scan  Completed   Hepatitis C Screening  Completed   Meningitis B Vaccine  Aged Out   Breast Cancer Screening  Discontinued  *Topic was postponed. The date shown is not the original due date.       05/17/2022    3:07 PM  Advanced Directives  Does Patient Have a Medical Advance Directive? Yes  Type of Estate Agent of [place];Living will  Copy of Healthcare Power of Attorney in Chart? No - copy requested    Vision: Annual vision screenings are recommended for early detection of glaucoma, cataracts, and diabetic retinopathy. These exams can also reveal signs of chronic conditions such as diabetes and high blood pressure.  Dental: Annual dental  screenings help detect early signs of oral cancer, gum disease, and other conditions linked to overall health, including heart disease and diabetes.  Please see the attached documents for additional preventive care recommendations.

## 2024-04-21 NOTE — Progress Notes (Signed)
 I connected with  Eileen Morgan on 04/21/2024 by a audio enabled telemedicine application and verified that I am speaking with the correct person using two identifiers.  Patient Location: Home  Provider Location: Home Office  Persons Participating in Visit: Patient.  I discussed the limitations of evaluation and management by telemedicine. The patient expressed understanding and agreed to proceed.  Vital Signs: Because this visit was a virtual/telehealth visit, some criteria may be missing or patient reported. Any vitals not documented were not able to be obtained and vitals that have been documented are patient reported.   This visit was performed by a medical professional under my direct supervision. I was immediately available for consultation/collaboration. I have reviewed and agree with the Annual Wellness Visit documentation.  Chief Complaint  Patient presents with   Medicare Wellness     Subjective:   Eileen Morgan is a 73 y.o. female who presents for a Medicare Annual Wellness Visit.  Visit info / Clinical Intake: Medicare Wellness Visit Type:: Subsequent Annual Wellness Visit Persons participating in visit and providing information:: patient Medicare Wellness Visit Mode:: Telephone If telephone:: video declined Since this visit was completed virtually, some vitals may be partially provided or unavailable. Missing vitals are due to the limitations of the virtual format.: Documented vitals are patient reported If Telephone or Video please confirm:: I connected with patient using audio/video enable telemedicine. I verified patient identity with two identifiers, discussed telehealth limitations, and patient agreed to proceed. Patient Location:: home Provider Location:: home office Interpreter Needed?: No Pre-visit prep was completed: yes AWV questionnaire completed by patient prior to visit?: no Living arrangements:: (!) lives alone Patient's Overall Health Status Rating: very  good Typical amount of pain: some Does pain affect daily life?: no Are you currently prescribed opioids?: no  Dietary Habits and Nutritional Risks How many meals a day?: 3 Eats fruit and vegetables daily?: yes Most meals are obtained by: preparing own meals In the last 2 weeks, have you had any of the following?: none Diabetic:: no  Functional Status Activities of Daily Living (to include ambulation/medication): Independent Ambulation: Independent Medication Administration: Independent Home Management (perform basic housework or laundry): Independent Manage your own finances?: yes Primary transportation is: driving Concerns about vision?: no *vision screening is required for WTM* Concerns about hearing?: no  Fall Screening Falls in the past year?: 0 Number of falls in past year: 0 Was there an injury with Fall?: 0 Fall Risk Category Calculator: 0 Patient Fall Risk Level: Low Fall Risk  Fall Risk Patient at Risk for Falls Due to: No Fall Risks Fall risk Follow up: Falls evaluation completed; Falls prevention discussed  Home and Transportation Safety: All rugs have non-skid backing?: yes All stairs or steps have railings?: yes Grab bars in the bathtub or shower?: yes Have non-skid surface in bathtub or shower?: yes Good home lighting?: yes Regular seat belt use?: yes Hospital stays in the last year:: no  Cognitive Assessment Difficulty concentrating, remembering, or making decisions? : no Will 6CIT or Mini Cog be Completed: yes What year is it?: 0 points What month is it?: 0 points Give patient an address phrase to remember (5 components): remember words apple , table, penny About what time is it?: 0 points Count backwards from 20 to 1: 0 points Say the months of the year in reverse: 0 points Repeat the address phrase from earlier: 0 points 6 CIT Score: 0 points  Advance Directives (For Healthcare) Does Patient Have a Medical Advance Directive?: No Would  patient  like information on creating a medical advance directive?: No - Patient declined  Reviewed/Updated  Reviewed/Updated: Reviewed All (Medical, Surgical, Family, Medications, Allergies, Care Teams, Patient Goals)    Allergies (verified) Penicillins and Yellow jacket venom   Current Medications (verified) Outpatient Encounter Medications as of 04/21/2024  Medication Sig   buPROPion  (WELLBUTRIN  SR) 200 MG 12 hr tablet TAKE 1 TABLET BY MOUTH TWICE A DAY   cyanocobalamin  (VITAMIN B12) 500 MCG tablet Take 1 tablet (500 mcg total) by mouth daily.   folic acid  (FOLVITE ) 800 MCG tablet Take 0.5 tablets (400 mcg total) by mouth daily.   hydrochlorothiazide  (HYDRODIURIL ) 25 MG tablet TAKE 1 TABLET (25 MG TOTAL) BY MOUTH DAILY.   levETIRAcetam  (KEPPRA ) 500 MG tablet Take 1 tablet (500 mg total) by mouth 2 (two) times daily.   loratadine  (CLARITIN ) 10 MG tablet Take 10 mg by mouth as needed for allergies.   meloxicam  (MOBIC ) 15 MG tablet Take 1 tablet (15 mg total) by mouth daily.   metFORMIN  (GLUCOPHAGE -XR) 500 MG 24 hr tablet 1 tab daily with meal   olopatadine  (PATADAY ) 0.1 % ophthalmic solution Place 1 drop into both eyes 2 (two) times daily.   omeprazole  (PRILOSEC) 20 MG capsule TAKE 1 CAPSULE BY MOUTH EVERY DAY   oxybutynin  (DITROPAN -XL) 10 MG 24 hr tablet Take 1 tablet (10 mg total) by mouth at bedtime.   OXYGEN  Inhale 2 L into the lungs daily as needed (oxygen ).   promethazine -dextromethorphan (PROMETHAZINE -DM) 6.25-15 MG/5ML syrup Take 5 mLs by mouth 4 (four) times daily as needed for cough.   Vitamin D , Ergocalciferol , (DRISDOL ) 1.25 MG (50000 UNIT) CAPS capsule Take 1 capsule (50,000 Units total) by mouth every 7 (seven) days.   No facility-administered encounter medications on file as of 04/21/2024.    History: Past Medical History:  Diagnosis Date   Back pain    COPD (chronic obstructive pulmonary disease) (HCC)    GERD (gastroesophageal reflux disease)    Pulmonary embolism (HCC)     Seizures (HCC)    Past Surgical History:  Procedure Laterality Date   BIOPSY  01/12/2021   Procedure: BIOPSY;  Surgeon: Wilhelmenia Aloha Raddle., MD;  Location: [facility] ENDOSCOPY;  Service: Gastroenterology;;  EGD and COLON   COLONOSCOPY WITH PROPOFOL  N/A 01/12/2021   Procedure: COLONOSCOPY WITH PROPOFOL ;  Surgeon: Wilhelmenia Aloha Raddle., MD;  Location[facility] ENDOSCOPY;  Service: Gastroenterology;  Laterality: N/A;   ESOPHAGOGASTRODUODENOSCOPY (EGD) WITH PROPOFOL  N/A 01/12/2021   Procedure: ESOPHAGOGASTRODUODENOSCOPY (EGD) WITH PROPOFOL ;  Surgeon: Wilhelmenia Aloha Raddle., MD;  Location: [facility] ENDOSCOPY;  Service: Gastroenterology;  Laterality: N/A;   POLYPECTOMY  01/12/2021   Procedure: POLYPECTOMY;  Surgeon: Mansouraty, Aloha Raddle., MD;  Location: [facility] ENDOSCOPY;  Service: Gastroenterology;;   TONSILLECTOMY     TUBAL LIGATION     Family History  Problem Relation Age of Onset   Stroke Mother    Hyperlipidemia Mother    Hypertension Mother    Thyroid  disease Mother    COPD Father    Emphysema Brother    CAD Neg Hx    Diabetes Mellitus II Neg Hx    Social History   Occupational History   Not on file  Tobacco Use   Smoking status: Former    Current packs/day: 0.00    Average packs/day: 1 pack/day for 40.0 years (40.0 ttl pk-yrs)    Types: Cigarettes    Start date: 03/08/1979    Quit date: 03/08/2019    Years since quitting: 5.1   Smokeless tobacco: Never  Vaping Use   Vaping status: Never Used  Substance and Sexual Activity   Alcohol use: Not Currently   Drug use: Never   Sexual activity: Not on file   Tobacco Counseling Counseling given: Not Answered  SDOH Screenings   Food Insecurity: No Food Insecurity (04/21/2024)  Housing: Low Risk (04/21/2024)  Transportation Needs: No Transportation Needs (04/21/2024)  Utilities: Not At Risk (04/21/2024)  Depression (PHQ2-9): Low Risk (04/21/2024)  Financial Resource Strain: Low Risk (05/17/2022)  Physical Activity: Sufficiently Active  (04/21/2024)  Social Connections: Socially Isolated (04/21/2024)  Stress: No Stress Concern Present (04/21/2024)  Tobacco Use: Medium Risk (04/21/2024)  Health Literacy: Adequate Health Literacy (04/21/2024)   See flowsheets for full screening details  Depression Screen PHQ 2 & 9 Depression Scale- Over the past 2 weeks, how often have you been bothered by any of the following problems? Little interest or pleasure in doing things: 0 Feeling down, depressed, or hopeless (PHQ Adolescent also includes...irritable): 0 PHQ-2 Total Score: 0 Trouble falling or staying asleep, or sleeping too much: 0 Feeling tired or having little energy: 0 Poor appetite or overeating (PHQ Adolescent also includes...weight loss): 0 Feeling bad about yourself - or that you are a failure or have let yourself or your family down: 0 Trouble concentrating on things, such as reading the newspaper or watching television (PHQ Adolescent also includes...like school work): 0 Moving or speaking so slowly that other people could have noticed. Or the opposite - being so fidgety or restless that you have been moving around a lot more than usual: 0 Thoughts that you would be better off dead, or of hurting yourself in some way: 0 PHQ-9 Total Score: 0 If you checked off any problems, how difficult have these problems made it for you to do your work, take care of things at home, or get along with other people?: Not difficult at all  Depression Treatment Depression Interventions/Treatment : EYV7-0 Score <4 Follow-up Not Indicated     Goals Addressed             This Visit's Progress    Patient Stated       To go through her things              Objective:    Today's Vitals   04/21/24 1501  BP: 135/69  Weight: 190 lb (86.2 kg)  Height: 5' 2 (1.575 m)   Body mass index is 34.75 kg/m.  Hearing/Vision screen Hearing Screening - Comments:: No difficulties  Vision Screening - Comments:: Patient is up to date  she is in between eye doctors  Immunizations and Health Maintenance Health Maintenance  Topic Date Due   Zoster Vaccines- Shingrix (1 of 2) Never done   COVID-19 Vaccine (3 - Pfizer risk series) 06/08/2020   Medicare Annual Wellness (AWV)  05/18/2023   Influenza Vaccine  08/04/2024 (Originally 12/06/2023)   Lung Cancer Screening  05/29/2024   Colonoscopy  01/13/2031   DTaP/Tdap/Td (2 - Td or Tdap) 03/29/2031   Pneumococcal Vaccine: 50+ Years  Completed   Bone Density Scan  Completed   Hepatitis C Screening  Completed   Meningococcal B Vaccine  Aged Out   Mammogram  Discontinued        Assessment/Plan:  This is a routine wellness examination for Eileen Morgan.  Patient Care Team: Oley Bascom RAMAN, NP as PCP - General (Pulmonary Disease)  I have personally reviewed and noted the following in the patients chart:   Medical and social history Use of alcohol,  tobacco or illicit drugs  Current medications and supplements including opioid prescriptions. Functional ability and status Nutritional status Physical activity Advanced directives List of other physicians Hospitalizations, surgeries, and ER visits in previous 12 months Vitals Screenings to include cognitive, depression, and falls Referrals and appointments  No orders of the defined types were placed in this encounter.  In addition, I have reviewed and discussed with patient certain preventive protocols, quality metrics, and best practice recommendations. A written personalized care plan for preventive services as well as general preventive health recommendations were provided to patient.   Lyle MARLA Right, [place]   04/21/2024   No follow-ups on file.  After Visit Summary: (MyChart) Due to this being a telephonic visit, the after visit summary with patients personalized plan was offered to patient via MyChart   No voiced or noted concerns at this time Vaccines not given: vaccinations  declined today

## 2024-04-22 ENCOUNTER — Ambulatory Visit: Payer: Self-pay | Admitting: Nurse Practitioner

## 2024-04-28 ENCOUNTER — Ambulatory Visit

## 2024-05-03 ENCOUNTER — Other Ambulatory Visit: Payer: Self-pay | Admitting: Nurse Practitioner

## 2024-05-03 DIAGNOSIS — F32A Depression, unspecified: Secondary | ICD-10-CM

## 2024-05-04 ENCOUNTER — Ambulatory Visit: Payer: Self-pay | Admitting: Nurse Practitioner

## 2024-05-05 NOTE — Telephone Encounter (Signed)
 Please advise [facility]

## 2024-05-13 ENCOUNTER — Ambulatory Visit (INDEPENDENT_AMBULATORY_CARE_PROVIDER_SITE_OTHER): Admitting: Family Medicine

## 2024-05-14 ENCOUNTER — Ambulatory Visit
Admission: RE | Admit: 2024-05-14 | Discharge: 2024-05-14 | Disposition: A | Discharge status: Home / Self Care | Source: Ambulatory Visit | Attending: Nurse Practitioner | Admitting: Nurse Practitioner

## 2024-05-14 DIAGNOSIS — Z1231 Encounter for screening mammogram for malignant neoplasm of breast: Secondary | ICD-10-CM

## 2024-05-15 ENCOUNTER — Other Ambulatory Visit: Payer: Self-pay | Admitting: Nurse Practitioner

## 2024-05-15 DIAGNOSIS — I10 Essential (primary) hypertension: Secondary | ICD-10-CM

## 2024-05-17 ENCOUNTER — Other Ambulatory Visit: Payer: Self-pay | Admitting: Nurse Practitioner

## 2024-05-17 DIAGNOSIS — N3289 Other specified disorders of bladder: Secondary | ICD-10-CM

## 2024-05-17 DIAGNOSIS — N3281 Overactive bladder: Secondary | ICD-10-CM

## 2024-05-18 ENCOUNTER — Encounter (INDEPENDENT_AMBULATORY_CARE_PROVIDER_SITE_OTHER): Payer: Self-pay | Admitting: Family Medicine

## 2024-05-18 ENCOUNTER — Ambulatory Visit: Payer: Self-pay | Admitting: Nurse Practitioner

## 2024-05-18 ENCOUNTER — Ambulatory Visit (INDEPENDENT_AMBULATORY_CARE_PROVIDER_SITE_OTHER): Admitting: Family Medicine

## 2024-05-18 VITALS — BP 131/80 | HR 57 | Temp 97.6°F | Ht 62.0 in | Wt 190.0 lb

## 2024-05-18 DIAGNOSIS — E669 Obesity, unspecified: Secondary | ICD-10-CM

## 2024-05-18 DIAGNOSIS — M8580 Other specified disorders of bone density and structure, unspecified site: Secondary | ICD-10-CM

## 2024-05-18 DIAGNOSIS — Z6834 Body mass index (BMI) 34.0-34.9, adult: Secondary | ICD-10-CM | POA: Diagnosis not present

## 2024-05-18 DIAGNOSIS — E559 Vitamin D deficiency, unspecified: Secondary | ICD-10-CM

## 2024-05-18 DIAGNOSIS — R7303 Prediabetes: Secondary | ICD-10-CM

## 2024-05-18 DIAGNOSIS — I10 Essential (primary) hypertension: Secondary | ICD-10-CM | POA: Diagnosis not present

## 2024-05-18 MED ORDER — METFORMIN HCL ER 500 MG PO TB24: ORAL_TABLET | ORAL | 0 refills | Status: AC

## 2024-05-18 NOTE — Telephone Encounter (Signed)
 oxybutynin  (DITROPAN -XL) 10 MG 24 hr tablet [Pharmacy Med Name: OXYBUTYNIN  CL ER 10 MG TABLET]

## 2024-05-18 NOTE — Telephone Encounter (Signed)
 Please advise [facility]

## 2024-05-18 NOTE — Progress Notes (Signed)
 "  Eileen Morgan, D.O.  ABFM, ABOM Specializing in Clinical Bariatric Medicine  Office located at: [address] [place], [place]  [address]      Medications Discontinued During This Encounter  Medication Reason   metFORMIN  (GLUCOPHAGE -XR) 500 MG 24 hr tablet Reorder     Meds ordered this encounter  Medications   metFORMIN  (GLUCOPHAGE -XR) 500 MG 24 hr tablet    Sig: 1 tab daily with meal    Dispense:  90 tablet    Refill:  0      A) FOR THE CHRONIC DISEASE OF OBESITY:  Obesity (BMI 30-39.9), Starting BMI 38.03 BMI 34.0-34.9,adult - Current BMI 34.74   Chief complaint: Obesity Eileen Morgan is here to discuss her progress with her obesity treatment plan.   History of present illness / Interval history:  Eileen Morgan is here today for her follow-up office visit.  Since last OV on 04/08/2024, pt is up 4 lbs.  Pt has been travelling a lot recently and during the holidays was somewhat off-plan. During travelling, she broke her tooth. Since then, she is back on the MP and adhering to recommended caloric and protein intake.    04/08/24 10:00 05/18/24 10:00   Body Fat % 42.4 % 42.7 %  Muscle Mass (lbs) 102 lbs 103.6 lbs  Fat Mass (lbs) 79 lbs 81.4 lbs  Total Body Water (lbs) 66.8 lbs 68 lbs  Visceral Fat Rating  14 14  Counseling done on how various foods will affect these numbers and how to maximize success   Total lbs lost to date: -18 lbs Total Fat Mass in lbs lost to date: -15 Total weight loss percentage to date: -8.65 %   Nutrition Therapy She is on the Category 2 Plan and states she is following her eating plan approximately 70 % of the time.   - Tracking Calories/Macros: yes  - Eating More Whole Foods: yes  - Adequate Protein Intake: yes  - Adequate Water Intake: no  - Skipping Meals: no  - Sleeping 7-9 Hours/ Night: yes   Eileen Morgan is currently in the action stage of change. As such, her goal is to continue weight management plan.  She has agreed to:  continue current plan   Physical Activity Pt is walking 15-30  minutes 5 days per week   Quina has been advised to work up to 300-450 minutes of moderate intensity aerobic activity a week and strengthening exercises 2-3 times per week for cardiovascular health, weight loss maintenance and preservation of muscle mass.  She has agreed to : Think about enjoyable ways to increase daily physical activity and overcoming barriers to exercise, Increase physical activity in their day and reduce sedentary time (increase NEAT)., and Combine aerobic and strengthening exercises for efficiency and improved cardiometabolic health.   Behavioral Modifications Evidence-based interventions for health behavior change were utilized today including the discussion of  1) self monitoring techniques: tracking calories, measuring lean protein 2) problem-solving barriers:  Broke her tooth  3) self care:  exercise 4) SMART goals for next OV:  Create a supportive home environment and adhere to MP. Regarding patient's less desirable eating habits and patterns, we employed the technique of small changes.   We discussed the following today: increasing lean protein intake to established goals, reading food labels , keeping healthy foods at home, continue to work on implementation of reduced calorie nutritional plan, continue to practice mindfulness when eating, and staying on track while traveling and vacationing Additional resources provided today:  None   Medical Interventions/ Pharmacotherapy Previous Bariatric surgery: n/a Pharmacotherapy for weight loss: She is currently taking Wellbutrin  SR 200 mg twice daily and Metformin -XR 500 mg once daily for medical weight loss.    We discussed various medication options to help Avaree with her weight loss efforts and we both agreed to : Continue with current nutritional and behavioral strategies   B) OBESITY RELATED CONDITIONS ADDRESSED TODAY:  Prediabetes Assessment &  Plan Lab Results  Component Value Date   HGBA1C 5.4 12/09/2023   HGBA1C 5.8 (H) 06/06/2023   HGBA1C 5.7 (A) 01/01/2023   INSULIN  6.7 06/06/2023  Managed by Metformin  -XR 500 mg once daily. Hunger is well controlled. Reports increased cravings for chips and candy. Discussed increasing dose to help manage cravings, pt declines for now and wants to focus on whole foods. Denies side effects. A1c is at goal. Cont regimen (refill today). Cont decreasing simple carbs and lean proteins. Increase exercise as able.    Hypertension, unspecified type Assessment & Plan Last 3 blood pressure readings in our office are as follows: BP Readings from Last 3 Encounters:  05/18/24 131/80  04/21/24 135/69  04/20/24 135/69   The 10-year ASCVD risk score (Arnett DK, et al., 2019) is: 21.9%  Lab Results  Component Value Date   CREATININE 0.79 04/20/2024  Managed with hydrochlorothiazide  25 mg once daily with good compliance and tolerance. BP is well controlled today. No acute concerns today. Cont adherence to medication. Cont low-sodium, heart-healthy diet. Increase exercise and water intake as able.    Vitamin D  deficiency- (with osteopenia) Assessment & Plan Lab Results  Component Value Date   VD25OH 38.8 03/11/2024   VD25OH 54.5 10/30/2023   VD25OH 48.2 10/28/2023  Currently taking ERGO 50K units once weekly with good compliance and tolerance. Vit D levels are below goal at 38.8; optimal bt 50-70. Cont regimen. Recheck as necessary.     Medications Discontinued During This Encounter  Medication Reason   metFORMIN  (GLUCOPHAGE -XR) 500 MG 24 hr tablet Reorder     Meds ordered this encounter  Medications   metFORMIN  (GLUCOPHAGE -XR) 500 MG 24 hr tablet    Sig: 1 tab daily with meal    Dispense:  90 tablet    Refill:  0      Follow up:   Return 06/24/2024 12:20 PM.  She was informed of the importance of frequent follow up visits to maximize her success with intensive lifestyle modifications  for her multiple health conditions.   Weight Summary and Biometrics   Weight Lost Since Last Visit: 0lb  Weight Gained Since Last Visit: 4lb    Vitals Temp: 97.6 F (36.4 C) BP: 131/80 Pulse Rate: (!) 57 SpO2: 98 %   Anthropometric Measurements Height: 5' 2 (1.575 m) Weight: 190 lb (86.2 kg) BMI (Calculated): 34.74 Weight at Last Visit: 186lb Weight Lost Since Last Visit: 0lb Weight Gained Since Last Visit: 4lb Starting Weight: 208lb Total Weight Loss (lbs): 18 lb (8.165 kg) Peak Weight: 211lb   Body Composition  Body Fat %: 42.7 % Fat Mass (lbs): 81.4 lbs Muscle Mass (lbs): 103.6 lbs Total Body Water (lbs): 68 lbs Visceral Fat Rating : 14   Other Clinical Data Fasting: no Labs: no Today's Visit #: 15 Starting Date: 06/06/23    Objective:   PHYSICAL EXAM: Blood pressure 131/80, pulse (!) 57, temperature 97.6 F (36.4 C), height 5' 2 (1.575 m), weight 190 lb (86.2 kg), SpO2 98%. Body mass index is 34.75 kg/m.  General: she  is overweight, cooperative and in no acute distress. PSYCH: Has normal mood, affect and thought process.   HEENT: EOMI, sclerae are anicteric. Lungs: Normal breathing effort, no conversational dyspnea. Extremities: Moves * 4 Neurologic: A and O * 3, good insight  DIAGNOSTIC DATA REVIEWED: BMET    Component Value Date/Time   NA 141 04/20/2024 1151   K 4.1 04/20/2024 1151   CL 101 04/20/2024 1151   CO2 25 04/20/2024 1151   GLUCOSE 90 04/20/2024 1151   GLUCOSE 169 (H) 03/21/2019 0356   BUN 19 04/20/2024 1151   CREATININE 0.79 04/20/2024 1151   CALCIUM 9.4 04/20/2024 1151   GFRNONAA 87 06/16/2019 1150   GFRAA 100 06/16/2019 1150   Lab Results  Component Value Date   HGBA1C 5.4 12/09/2023   HGBA1C 5.5 12/22/2020   Lab Results  Component Value Date   INSULIN  6.7 06/06/2023   Lab Results  Component Value Date   TSH 3.010 06/06/2023   CBC    Component Value Date/Time   WBC 6.1 04/20/2024 1151   WBC 5.9  03/21/2019 0356   RBC 4.93 04/20/2024 1151   RBC 4.49 03/21/2019 0356   HGB 15.1 04/20/2024 1151   HCT 46.8 (H) 04/20/2024 1151   PLT 232 04/20/2024 1151   MCV 95 04/20/2024 1151   MCH 30.6 04/20/2024 1151   MCH 29.6 03/21/2019 0356   MCHC 32.3 04/20/2024 1151   MCHC 32.0 03/21/2019 0356   RDW 12.6 04/20/2024 1151   Iron Studies    Component Value Date/Time   IRON 100 10/28/2023 1434   TIBC 386 07/03/2023 1006   FERRITIN 155 (H) 07/03/2023 1006   IRONPCTSAT 27 07/03/2023 1006   Lipid Panel     Component Value Date/Time   CHOL 192 12/09/2023 1209   TRIG 139 12/09/2023 1209   HDL 63 12/09/2023 1209   CHOLHDL 3.0 12/09/2023 1209   LDLCALC 105 (H) 12/09/2023 1209   LDLDIRECT 105 (H) 12/09/2023 1209   Hepatic Function Panel     Component Value Date/Time   PROT 6.3 04/20/2024 1151   ALBUMIN 4.3 04/20/2024 1151   AST 17 04/20/2024 1151   ALT 14 04/20/2024 1151   ALKPHOS 81 04/20/2024 1151   BILITOT 0.4 04/20/2024 1151   BILIDIR 0.12 07/03/2022 1041      Component Value Date/Time   TSH 3.010 06/06/2023 1440   Nutritional Lab Results  Component Value Date   VD25OH 38.8 03/11/2024   VD25OH 54.5 10/30/2023   VD25OH 48.2 10/28/2023    Attestations:   I, Feliciano Mingle, acting as a stage manager for [employer], DO., have compiled all relevant documentation for today's office visit on behalf of Eileen Jenkins, DO, while in the presence of [employer], DO.    I have reviewed the above documentation for accuracy and completeness, and I agree with the above. Eileen Morgan, D.O.  The 21st Century Cures Act was signed into law in 2016 which includes the topic of electronic health records.  This provides immediate access to information in MyChart.  This includes consultation notes, operative notes, office notes, lab results and pathology reports.  If you have any questions about what you read please let us  know at your next visit so we can discuss your concerns  and take corrective action if need be.  We are right here with you.  "

## 2024-05-19 ENCOUNTER — Telehealth: Payer: Self-pay | Admitting: Nurse Practitioner

## 2024-05-19 ENCOUNTER — Other Ambulatory Visit: Payer: Self-pay | Admitting: Nurse Practitioner

## 2024-05-19 DIAGNOSIS — R928 Other abnormal and inconclusive findings on diagnostic imaging of breast: Secondary | ICD-10-CM

## 2024-05-19 NOTE — Telephone Encounter (Signed)
 Form to be signed and faxed placed in providers slot. Provider has the folder currently. 05/19/24

## 2024-05-22 ENCOUNTER — Inpatient Hospital Stay: Admission: RE | Admit: 2024-05-22

## 2024-05-22 ENCOUNTER — Other Ambulatory Visit: Payer: Self-pay | Admitting: Nurse Practitioner

## 2024-05-22 DIAGNOSIS — R928 Other abnormal and inconclusive findings on diagnostic imaging of breast: Secondary | ICD-10-CM

## 2024-05-25 ENCOUNTER — Ambulatory Visit: Payer: Self-pay | Admitting: Nurse Practitioner

## 2024-05-26 ENCOUNTER — Encounter: Payer: Self-pay | Admitting: Acute Care

## 2024-05-29 ENCOUNTER — Ambulatory Visit
Admission: RE | Admit: 2024-05-29 | Discharge: 2024-05-29 | Disposition: A | Source: Ambulatory Visit | Attending: Nurse Practitioner | Admitting: Nurse Practitioner

## 2024-05-29 ENCOUNTER — Ambulatory Visit
Admission: RE | Admit: 2024-05-29 | Discharge: 2024-05-29 | Disposition: A | Source: Ambulatory Visit | Attending: Nurse Practitioner

## 2024-05-29 DIAGNOSIS — R928 Other abnormal and inconclusive findings on diagnostic imaging of breast: Secondary | ICD-10-CM

## 2024-06-01 ENCOUNTER — Inpatient Hospital Stay: Admission: RE | Admit: 2024-06-01 | Source: Ambulatory Visit

## 2024-06-01 ENCOUNTER — Encounter

## 2024-06-01 LAB — SURGICAL PATHOLOGY

## 2024-06-24 ENCOUNTER — Ambulatory Visit (INDEPENDENT_AMBULATORY_CARE_PROVIDER_SITE_OTHER): Admitting: Family Medicine

## 2024-08-13 ENCOUNTER — Institutional Professional Consult (permissible substitution): Admitting: Neurology

## 2024-10-29 ENCOUNTER — Ambulatory Visit: Admitting: Adult Health
